# Patient Record
Sex: Female | Born: 1965
Health system: Southern US, Community
[De-identification: ages and names within clinical notes are randomized; demographics above are authoritative.]

## PROBLEM LIST (undated history)

## (undated) DIAGNOSIS — M1990 Unspecified osteoarthritis, unspecified site: Secondary | ICD-10-CM

## (undated) DIAGNOSIS — I739 Peripheral vascular disease, unspecified: Secondary | ICD-10-CM

## (undated) DIAGNOSIS — R7303 Prediabetes: Secondary | ICD-10-CM

## (undated) DIAGNOSIS — R0600 Dyspnea, unspecified: Secondary | ICD-10-CM

## (undated) DIAGNOSIS — E785 Hyperlipidemia, unspecified: Secondary | ICD-10-CM

## (undated) DIAGNOSIS — E669 Obesity, unspecified: Secondary | ICD-10-CM

## (undated) DIAGNOSIS — F419 Anxiety disorder, unspecified: Secondary | ICD-10-CM

## (undated) DIAGNOSIS — I10 Essential (primary) hypertension: Secondary | ICD-10-CM

## (undated) HISTORY — PX: ECTOPIC PREGNANCY SURGERY: SHX613

---

## 1998-03-12 ENCOUNTER — Emergency Department (HOSPITAL_COMMUNITY): Admission: EM | Admit: 1998-03-12 | Discharge: 1998-03-12 | Payer: Self-pay | Admitting: Emergency Medicine

## 1998-03-21 ENCOUNTER — Emergency Department (HOSPITAL_COMMUNITY): Admission: EM | Admit: 1998-03-21 | Discharge: 1998-03-21 | Payer: Self-pay | Admitting: Emergency Medicine

## 1998-04-04 ENCOUNTER — Emergency Department (HOSPITAL_COMMUNITY): Admission: EM | Admit: 1998-04-04 | Discharge: 1998-04-04 | Payer: Self-pay | Admitting: Emergency Medicine

## 1998-05-18 ENCOUNTER — Inpatient Hospital Stay (HOSPITAL_COMMUNITY): Admission: RE | Admit: 1998-05-18 | Discharge: 1998-05-21 | Payer: Self-pay | Admitting: *Deleted

## 1999-09-08 ENCOUNTER — Encounter: Payer: Self-pay | Admitting: *Deleted

## 1999-09-08 ENCOUNTER — Emergency Department (HOSPITAL_COMMUNITY): Admission: EM | Admit: 1999-09-08 | Discharge: 1999-09-08 | Payer: Self-pay | Admitting: Emergency Medicine

## 1999-09-09 ENCOUNTER — Encounter: Payer: Self-pay | Admitting: Emergency Medicine

## 2000-01-10 ENCOUNTER — Emergency Department (HOSPITAL_COMMUNITY): Admission: EM | Admit: 2000-01-10 | Discharge: 2000-01-10 | Payer: Self-pay | Admitting: Emergency Medicine

## 2004-03-21 ENCOUNTER — Emergency Department (HOSPITAL_COMMUNITY): Admission: EM | Admit: 2004-03-21 | Discharge: 2004-03-21 | Payer: Self-pay | Admitting: Emergency Medicine

## 2005-01-22 ENCOUNTER — Emergency Department (HOSPITAL_COMMUNITY): Admission: EM | Admit: 2005-01-22 | Discharge: 2005-01-22 | Payer: Self-pay | Admitting: Emergency Medicine

## 2005-02-04 ENCOUNTER — Emergency Department (HOSPITAL_COMMUNITY): Admission: EM | Admit: 2005-02-04 | Discharge: 2005-02-04 | Payer: Self-pay | Admitting: Emergency Medicine

## 2005-02-06 ENCOUNTER — Emergency Department (HOSPITAL_COMMUNITY): Admission: EM | Admit: 2005-02-06 | Discharge: 2005-02-06 | Payer: Self-pay | Admitting: Emergency Medicine

## 2006-06-11 ENCOUNTER — Emergency Department (HOSPITAL_COMMUNITY): Admission: EM | Admit: 2006-06-11 | Discharge: 2006-06-11 | Payer: Self-pay | Admitting: Emergency Medicine

## 2007-01-05 ENCOUNTER — Emergency Department (HOSPITAL_COMMUNITY): Admission: EM | Admit: 2007-01-05 | Discharge: 2007-01-05 | Payer: Self-pay | Admitting: Emergency Medicine

## 2007-06-07 ENCOUNTER — Emergency Department (HOSPITAL_COMMUNITY): Admission: EM | Admit: 2007-06-07 | Discharge: 2007-06-07 | Payer: Self-pay | Admitting: Emergency Medicine

## 2007-12-04 ENCOUNTER — Emergency Department (HOSPITAL_COMMUNITY): Admission: EM | Admit: 2007-12-04 | Discharge: 2007-12-04 | Payer: Self-pay | Admitting: Emergency Medicine

## 2008-03-14 ENCOUNTER — Emergency Department (HOSPITAL_COMMUNITY): Admission: EM | Admit: 2008-03-14 | Discharge: 2008-03-14 | Payer: Self-pay | Admitting: Emergency Medicine

## 2012-11-01 ENCOUNTER — Encounter (HOSPITAL_COMMUNITY): Payer: Self-pay | Admitting: *Deleted

## 2012-11-01 ENCOUNTER — Emergency Department (HOSPITAL_COMMUNITY)
Admission: EM | Admit: 2012-11-01 | Discharge: 2012-11-01 | Disposition: A | Discharge status: Home / Self Care | Payer: Self-pay | Attending: Emergency Medicine | Admitting: Emergency Medicine

## 2012-11-01 DIAGNOSIS — M542 Cervicalgia: Secondary | ICD-10-CM | POA: Insufficient documentation

## 2012-11-01 DIAGNOSIS — E669 Obesity, unspecified: Secondary | ICD-10-CM | POA: Insufficient documentation

## 2012-11-01 DIAGNOSIS — I10 Essential (primary) hypertension: Secondary | ICD-10-CM | POA: Insufficient documentation

## 2012-11-01 DIAGNOSIS — R51 Headache: Secondary | ICD-10-CM | POA: Insufficient documentation

## 2012-11-01 DIAGNOSIS — F17200 Nicotine dependence, unspecified, uncomplicated: Secondary | ICD-10-CM | POA: Insufficient documentation

## 2012-11-01 DIAGNOSIS — K029 Dental caries, unspecified: Secondary | ICD-10-CM | POA: Insufficient documentation

## 2012-11-01 HISTORY — DX: Essential (primary) hypertension: I10

## 2012-11-01 HISTORY — DX: Obesity, unspecified: E66.9

## 2012-11-01 MED ORDER — AMOXICILLIN 500 MG PO CAPS: 500.0000 mg | ORAL_CAPSULE | Freq: Three times a day (TID) | ORAL | Status: DC

## 2012-11-01 MED ORDER — OXYCODONE-ACETAMINOPHEN 5-325 MG PO TABS: ORAL_TABLET | ORAL | Status: DC

## 2012-11-01 MED ORDER — OXYCODONE-ACETAMINOPHEN 5-325 MG PO TABS
1.0000 | ORAL_TABLET | Freq: Once | ORAL | Status: AC
  Administered 2012-11-01: 1 via ORAL
  Filled 2012-11-01: qty 1

## 2012-11-01 NOTE — ED Provider Notes (Signed)
History  This chart was scribed for non-physician practitioner Wynetta Emery, PA-C working with Toy Baker, MD, by Candelaria Stagers, ED Scribe. This patient was seen in room WTR9/WTR9 and the patient's care was started at 4:14 PM   CSN: [id number]  Arrival date & time 11/01/12  1502   First MD Initiated Contact with Patient 11/01/12 1548      Chief Complaint  Patient presents with  . Dental Pain  . Headache  . Neck Pain     The history is provided by the patient. No language interpreter was used.   HPI Comments: Sharon Dyer is a 47 y.o. female who presents to the Emergency Department complaining of constant left upper dental pain that started several weeks ago and became worse over the last three days.  Pt reports the pain is 7/10 now with intermittent sharp pains.  Pt denies fever, chills, nausea, or vomiting, trismus, difficulty swallowing her secretions.  Pt has taken ibuprofen with temporary relief, reporting taking 5 ibuprofen 3-4 times a day.  She is also experiencing headache and neck pain.    Past Medical History  Diagnosis Date  . Hypertension   . Obesity     Past Surgical History  Procedure Laterality Date  . Ectopic pregnancy surgery      2    History reviewed. No pertinent family history.  History  Substance Use Topics  . Smoking status: Current Every Day Smoker -- 1.00 packs/day    Types: Cigarettes  . Smokeless tobacco: Never Used  . Alcohol Use: Yes     Comment: twice a year    OB History   Grav Para Term Preterm Abortions TAB SAB Ect Mult Living                  Review of Systems  Constitutional: Negative for fever and chills.  HENT: Positive for dental problem (left upper dental pain).   Respiratory: Negative for shortness of breath.   Cardiovascular: Negative for chest pain.  Gastrointestinal: Negative for nausea, vomiting, abdominal pain and diarrhea.  All other systems reviewed and are negative.    Allergies  Review of  patient's allergies indicates no known allergies.  Home Medications   Current Outpatient Rx  Name  Route  Sig  Dispense  Refill  . ibuprofen (ADVIL,MOTRIN) 200 MG tablet   Oral   Take 1,000 mg by mouth every 6 (six) hours as needed for pain.         Marland Kitchen lisinopril (PRINIVIL,ZESTRIL) 10 MG tablet   Oral   Take 10 mg by mouth every morning.           BP 147/70  Pulse 60  Temp(Src) 98.5 F (36.9 C) (Oral)  Resp 18  Ht 5\' 8"  (1.727 m)  Wt 300 lb (136.079 kg)  BMI 45.63 kg/m2  SpO2 100%  LMP 10/18/2012  Physical Exam  Nursing note and vitals reviewed. Constitutional: She is oriented to person, place, and time. She appears well-developed and well-nourished. No distress.  HENT:  Head: Normocephalic. No trismus in the jaw.  Mouth/Throat: Uvula is midline and oropharynx is clear and moist. No edematous. No oropharyngeal exudate, posterior oropharyngeal edema, posterior oropharyngeal erythema or tonsillar abscesses.    Generally poor dentition, no gingival swelling, erythema or tenderness to palpation. Patient is handling their secretions. There is no tenderness to palpation or firmness underneath tongue bilaterally. No trismus.    Eyes: Conjunctivae and EOM are normal.  Cardiovascular: Normal rate.   Pulmonary/Chest:  Effort normal. No stridor.  Musculoskeletal: Normal range of motion.  Lymphadenopathy:    She has no cervical adenopathy.  Neurological: She is alert and oriented to person, place, and time.  Psychiatric: She has a normal mood and affect.    ED Course  Procedures   DIAGNOSTIC STUDIES: Oxygen Saturation is 100% on room air, normal by my interpretation.    COORDINATION OF CARE:  4:21 PM Discussed course of care with pt which includes pain medication.  Discussed safe instructions on ibuprofen use.  Advised follow up with dentist, will provide referral.  Pt understands and agrees.    Labs Reviewed - No data to display No results found.   1. Dental  caries       MDM   Filed Vitals:   11/01/12 1555  BP: 147/70  Pulse: 60  Temp: 98.5 F (36.9 C)  TempSrc: Oral  Resp: 18  Height: 5\' 8"  (1.727 m)  Weight: 300 lb (136.079 kg)  SpO2: 100%     Sharon Dyer is a 47 y.o. female Patient with toothache.  No gross abscess.  Exam unconcerning for Ludwig's angina or spread of infection.  Will treat with penicillin and pain medicine.  Urged patient to follow-up with dentist.     Medications  oxyCODONE-acetaminophen (PERCOCET/ROXICET) 5-325 MG per tablet 1 tablet (1 tablet Oral Given 11/01/12 1642)    The patient is hemodynamically stable, appropriate for, and amenable to, discharge at this time. Pt verbalized understanding and agrees with care plan. Outpatient follow-up and return precautions given.    Discharge Medication List as of 11/01/2012  4:28 PM    START taking these medications   Details  amoxicillin (AMOXIL) 500 MG capsule Take 1 capsule (500 mg total) by mouth 3 (three) times daily., Starting 11/01/2012, Until Discontinued, Print    oxyCODONE-acetaminophen (PERCOCET/ROXICET) 5-325 MG per tablet 1 to 2 tabs PO q6hrs  PRN for pain, Print        I personally performed the services described in this documentation, which was scribed in my presence. The recorded information has been reviewed and is accurate.        Wynetta Emery, PA-C 11/03/12 1047

## 2012-11-01 NOTE — ED Notes (Signed)
Pt from home with reports of dental pain due to "2 bad teeth" on left upper area as well as a headache and neck pain that has worsened over the last 3 days. Pt reports pain was only at night but is now constant. Minimal swelling noted to left side of face.

## 2012-11-04 NOTE — ED Provider Notes (Signed)
Medical screening examination/treatment/procedure(s) were performed by non-physician practitioner and as supervising physician I was immediately available for consultation/collaboration.  Mckayla Mulcahey T Mychele Seyller, MD 11/04/12 2325 

## 2013-05-18 ENCOUNTER — Emergency Department (HOSPITAL_COMMUNITY): Payer: Self-pay

## 2013-05-18 ENCOUNTER — Encounter (HOSPITAL_COMMUNITY): Payer: Self-pay | Admitting: Emergency Medicine

## 2013-05-18 ENCOUNTER — Emergency Department (HOSPITAL_COMMUNITY)
Admission: EM | Admit: 2013-05-18 | Discharge: 2013-05-18 | Disposition: A | Discharge status: Home / Self Care | Payer: Self-pay | Attending: Emergency Medicine | Admitting: Emergency Medicine

## 2013-05-18 DIAGNOSIS — M109 Gout, unspecified: Secondary | ICD-10-CM | POA: Insufficient documentation

## 2013-05-18 DIAGNOSIS — E669 Obesity, unspecified: Secondary | ICD-10-CM | POA: Insufficient documentation

## 2013-05-18 DIAGNOSIS — Z79899 Other long term (current) drug therapy: Secondary | ICD-10-CM | POA: Insufficient documentation

## 2013-05-18 DIAGNOSIS — I10 Essential (primary) hypertension: Secondary | ICD-10-CM | POA: Insufficient documentation

## 2013-05-18 DIAGNOSIS — F17200 Nicotine dependence, unspecified, uncomplicated: Secondary | ICD-10-CM | POA: Insufficient documentation

## 2013-05-18 MED ORDER — OXYCODONE-ACETAMINOPHEN 5-325 MG PO TABS
1.0000 | ORAL_TABLET | Freq: Once | ORAL | Status: AC
  Administered 2013-05-18: 1 via ORAL
  Filled 2013-05-18: qty 1

## 2013-05-18 MED ORDER — IBUPROFEN 600 MG PO TABS: 600.0000 mg | ORAL_TABLET | Freq: Three times a day (TID) | ORAL | Status: DC | PRN

## 2013-05-18 MED ORDER — IBUPROFEN 800 MG PO TABS
800.0000 mg | ORAL_TABLET | Freq: Once | ORAL | Status: AC
  Administered 2013-05-18: 800 mg via ORAL
  Filled 2013-05-18: qty 1

## 2013-05-18 MED ORDER — HYDROCODONE-ACETAMINOPHEN 5-325 MG PO TABS: 1.0000 | ORAL_TABLET | ORAL | Status: DC | PRN

## 2013-05-18 NOTE — ED Provider Notes (Signed)
CSN: [id number]     Arrival date & time 05/18/13  0804 History   First MD Initiated Contact with Patient 05/18/13 [phone]     Chief Complaint  Patient presents with  . Hand Pain   HPI Patient reports developing pain in her right ring finger over the past 12-24 hours.  She has a history of gout.  No fevers or chills.  No trauma or injury to the right ring finger.  No recent pain.  No spreading erythema.  She states her pain is moderate in severity with light touch and palpation.   Past Medical History  Diagnosis Date  . Hypertension   . Obesity    Past Surgical History  Procedure Laterality Date  . Ectopic pregnancy surgery      2   No family history on file. History  Substance Use Topics  . Smoking status: Current Every Day Smoker -- 1.00 packs/day    Types: Cigarettes  . Smokeless tobacco: Never Used  . Alcohol Use: Yes     Comment: twice a year   OB History   Grav Para Term Preterm Abortions TAB SAB Ect Mult Living                 Review of Systems  All other systems reviewed and are negative.    Allergies  Review of patient's allergies indicates no known allergies.  Home Medications   Current Outpatient Rx  Name  Route  Sig  Dispense  Refill  . ibuprofen (ADVIL,MOTRIN) 200 MG tablet   Oral   Take 1,000 mg by mouth every 6 (six) hours as needed for pain.         Marland Kitchen lisinopril (PRINIVIL,ZESTRIL) 20 MG tablet   Oral   Take 20 mg by mouth daily.         Marland Kitchen HYDROcodone-acetaminophen (NORCO/VICODIN) 5-325 MG per tablet   Oral   Take 1 tablet by mouth every 4 (four) hours as needed for moderate pain.   15 tablet   0   . ibuprofen (ADVIL,MOTRIN) 600 MG tablet   Oral   Take 1 tablet (600 mg total) by mouth every 8 (eight) hours as needed.   15 tablet   0    BP 213/99  Pulse 89  Temp(Src) 98.1 F (36.7 C) (Oral)  Resp 16  SpO2 99%  LMP 04/24/2013 Physical Exam  Nursing note and vitals reviewed. Constitutional: She is oriented to person, place, and  time. She appears well-developed and well-nourished.  HENT:  Head: Normocephalic.  Eyes: EOM are normal.  Neck: Normal range of motion.  Pulmonary/Chest: Effort normal.  Abdominal: She exhibits no distension.  Musculoskeletal: Normal range of motion.  Patient with swelling of the PIP joint of the right ring finger.  Mild erythema and swelling over the dorsum of the proximal phalanx of the same finger.  Tenderness on the volar side or swelling on the volar side.  Neurological: She is alert and oriented to person, place, and time.  Psychiatric: She has a normal mood and affect.    ED Course  Procedures (including critical care time) Labs Review Labs Reviewed - No data to display Imaging Review No results found.  EKG Interpretation   None       MDM   1. Gout flare    Patient has a history of gout and this likely is a flare of gout in her right ring finger PIP joint.  Anti-inflammatories.  Doubt septic arthritis.  No trauma or injury.  Lyanne Co, MD 05/18/13 [phone]

## 2013-05-18 NOTE — ED Notes (Signed)
Pt has a erythema to rt ring finger to rt hand. Denies any injury states that it started yesterday. Swelling and pain to knuckle area.

## 2013-05-18 NOTE — Progress Notes (Signed)
[facility] provided pt with a list of primary care resources and information on [facility].  °

## 2013-10-08 ENCOUNTER — Encounter (HOSPITAL_COMMUNITY): Payer: Self-pay | Admitting: Emergency Medicine

## 2013-10-08 ENCOUNTER — Emergency Department (HOSPITAL_COMMUNITY)
Admission: EM | Admit: 2013-10-08 | Discharge: 2013-10-08 | Disposition: A | Discharge status: Home / Self Care | Payer: Self-pay | Attending: Emergency Medicine | Admitting: Emergency Medicine

## 2013-10-08 DIAGNOSIS — Z79899 Other long term (current) drug therapy: Secondary | ICD-10-CM | POA: Insufficient documentation

## 2013-10-08 DIAGNOSIS — F17200 Nicotine dependence, unspecified, uncomplicated: Secondary | ICD-10-CM | POA: Insufficient documentation

## 2013-10-08 DIAGNOSIS — H209 Unspecified iridocyclitis: Secondary | ICD-10-CM | POA: Insufficient documentation

## 2013-10-08 DIAGNOSIS — E669 Obesity, unspecified: Secondary | ICD-10-CM | POA: Insufficient documentation

## 2013-10-08 DIAGNOSIS — I10 Essential (primary) hypertension: Secondary | ICD-10-CM | POA: Insufficient documentation

## 2013-10-08 MED ORDER — PROPARACAINE HCL 0.5 % OP SOLN
1.0000 [drp] | Freq: Once | OPHTHALMIC | Status: AC
Start: 2013-10-08 — End: 2013-10-08
  Administered 2013-10-08: 1 [drp] via OPHTHALMIC
  Filled 2013-10-08: qty 15

## 2013-10-08 MED ORDER — FLUORESCEIN SODIUM 1 MG OP STRP
1.0000 | ORAL_STRIP | Freq: Once | OPHTHALMIC | Status: AC
  Administered 2013-10-08: 1 via OPHTHALMIC
  Filled 2013-10-08: qty 1

## 2013-10-08 MED ORDER — PREDNISOLONE ACETATE 1 % OP SUSP: 1.0000 [drp] | Freq: Four times a day (QID) | OPHTHALMIC | Status: DC

## 2013-10-08 MED ORDER — PREDNISOLONE ACETATE 1 % OP SUSP
1.0000 [drp] | Freq: Once | OPHTHALMIC | Status: AC
  Administered 2013-10-08: 1 [drp] via OPHTHALMIC
  Filled 2013-10-08: qty 1

## 2013-10-08 NOTE — ED Notes (Addendum)
shes had pain and redness to bilateral eyes "for a while." she ws seen by a doctor and treated with prednisone eye drops but symptoms persist. Shes on bp medication, states shes taking as directed but her bp is elevated today

## 2013-10-08 NOTE — ED Provider Notes (Signed)
CSN: [id number]     Arrival date & time 10/08/13  1854 History  This chart was scribed for non-physician practitioner, Wynetta EmeryNicole Eulice Rutledge, PA-C working with Geoffery Lyonsouglas Delo, MD by Greggory StallionKayla Andersen, ED scribe. This patient was seen in room TR04C/TR04C and the patient's care was started at 7:53 PM.    Chief Complaint  Patient presents with  . Eye Pain   The history is provided by the patient. No language interpreter was used.   HPI Comments: Sharon Dyer is a 48 y.o. female who presents to the Emergency Department complaining of gradual onset left eye pain and redness that started 2 days ago. She also has associated photophobia and headache. States it worsened yesterday and spread to her right eye. Eye movement worsens the pain. She states this has happened two other times in the last year but this is worse than the other times. Pt has been evaluated for the same in the past, diagnosed with iritis and treated with prednisone eye drops with no relief. She states she has not seen an ophthalmologist because she does not have insurance. Pt has done warm compresses with total relief of pain and taken ibuprofen with no relief. Denies fever, eye discharge, trouble speaking, difficulty ambulating, blurry vision, double vision. Family states her eyes do not look swollen. Denies history of headaches.   Past Medical History  Diagnosis Date  . Hypertension   . Obesity    Past Surgical History  Procedure Laterality Date  . Ectopic pregnancy surgery      2   History reviewed. No pertinent family history. History  Substance Use Topics  . Smoking status: Current Every Day Smoker -- 1.00 packs/day    Types: Cigarettes  . Smokeless tobacco: Never Used  . Alcohol Use: Yes     Comment: twice a year   OB History   Grav Para Term Preterm Abortions TAB SAB Ect Mult Living                 Review of Systems  HENT: Negative for facial swelling.   Eyes: Positive for photophobia, pain and redness. Negative for  discharge and visual disturbance.  Neurological: Positive for headaches.  All other systems reviewed and are negative.  Allergies  Review of patient's allergies indicates no known allergies.  Home Medications   Prior to Admission medications   Medication Sig Start Date End Date Taking? Authorizing Provider  HYDROcodone-acetaminophen (NORCO/VICODIN) 5-325 MG per tablet Take 1 tablet by mouth every 4 (four) hours as needed for moderate pain. 05/18/13   Lyanne CoKevin M Campos, MD  ibuprofen (ADVIL,MOTRIN) 200 MG tablet Take 1,000 mg by mouth every 6 (six) hours as needed for pain.    Historical Provider, MD  ibuprofen (ADVIL,MOTRIN) 600 MG tablet Take 1 tablet (600 mg total) by mouth every 8 (eight) hours as needed. 05/18/13   Lyanne CoKevin M Campos, MD  lisinopril (PRINIVIL,ZESTRIL) 20 MG tablet Take 20 mg by mouth daily.    Historical Provider, MD   Pulse 87  Temp(Src) 98.2 F (36.8 C) (Oral)  Resp 16  Ht 5\' 8"  (1.727 m)  Wt 300 lb (136.079 kg)  BMI 45.63 kg/m2  SpO2 98%  Physical Exam  Nursing note and vitals reviewed. Constitutional: She is oriented to person, place, and time. She appears well-developed and well-nourished. No distress.  HENT:  Head: Normocephalic.  Eyes: EOM are normal. Pupils are equal, round, and reactive to light. Right eye exhibits no discharge. Left eye exhibits no discharge. Right conjunctiva is  injected. Left conjunctiva is injected.  Slit lamp exam:      The right eye shows no corneal abrasion and no corneal ulcer.       The left eye shows no corneal abrasion and no corneal ulcer.  Left eye pressure: 18 Right eye pressure: 17 Consensual photophobia.  Visual Acuity - Bilateral Near: 20/20 ; R Near: 20/20 ; L Near: 20/30   Pain resolved with proparacaine.   Cardiovascular: Normal rate.   Pulmonary/Chest: Effort normal. No stridor.  Musculoskeletal: Normal range of motion.  Neurological: She is alert and oriented to person, place, and time.  Psychiatric: She has a  normal mood and affect.    ED Course  Procedures (including critical care time)  DIAGNOSTIC STUDIES: Oxygen Saturation is 98% on RA, normal by my interpretation.    COORDINATION OF CARE: 7:56 PM-Discussed treatment plan which includes numbing drops and speaking with Dr. Judd Lienelo with pt at bedside and pt agreed to plan.   8:28 PM-Pt states pain improved with proparacaine.   9:14 PM-Consulted with Dr. Charlotte SanesMcCuen from ophthalmology. She advised that pt be started on predforte QID and follow up in office.   Labs Review Labs Reviewed - No data to display  Imaging Review No results found.   EKG Interpretation None      MDM   Final diagnoses:  None   Filed Vitals:   10/08/13 1859 10/08/13 1916 10/08/13 2140  BP:  218/100 190/110  Pulse: 87  76  Temp: 98.2 F (36.8 C)  98.4 F (36.9 C)  TempSrc: Oral  Oral  Resp: 16  18  Height: 5\' 8"  (1.727 m)    Weight: 300 lb (136.079 kg)    SpO2: 98%  98%    Medications  proparacaine (ALCAINE) 0.5 % ophthalmic solution 1 drop (1 drop Both Eyes Given 10/08/13 2039)  fluorescein ophthalmic strip 1 strip (1 strip Both Eyes Given 10/08/13 2040)  prednisoLONE acetate (PRED FORTE) 1 % ophthalmic suspension 1 drop (1 drop Both Eyes Given 10/08/13 2142)    Sharon Dyer is a 48 y.o. female presenting with bilateral eye redness and pain. This is a recurrent event for her. Slip lamp exam could not be performed as slit lamp is nonfunctional. Patient has normal pressures in the eye. Ophthalmology consult from Dr. Charlotte SanesMcCuen appreciated: States patient does not require emergent specialist evaluation at this time but requests prescription for Pred Forte and she will see the patient in the office in the morning at 8:30 AM on Monday. I discussed return precautions with the patient who has verbalized her understanding.  Evaluation does not show pathology that would require ongoing emergent intervention or inpatient treatment. Pt is hemodynamically stable and  mentating appropriately. Discussed findings and plan with patient/guardian, who agrees with care plan. All questions answered. Return precautions discussed and outpatient follow up given.   Discharge Medication List as of 10/08/2013  9:25 PM    START taking these medications   Details  prednisoLONE acetate (PRED FORTE) 1 % ophthalmic suspension Place 1 drop into both eyes 4 (four) times daily., Starting 10/08/2013, Until Discontinued, Print        Note: Portions of this report may have been transcribed using voice recognition software. Every effort was made to ensure accuracy; however, inadvertent computerized transcription errors may be present   I personally performed the services described in this documentation, which was scribed in my presence. The recorded information has been reviewed and is accurate.  Joni Reiningicole Karell Tukes, PA-C 10/10/13 0120

## 2013-10-08 NOTE — ED Notes (Signed)
PA at bedside.

## 2013-10-08 NOTE — Discharge Instructions (Signed)
You have an appointment scheduled with the ophthalmologist, Dr. Charlotte SanesMcCuen at 8:30 AM Monday. Do not hesitate to return to the emergency room cranial, worsening or concerning symptoms.  Put 1 drop in each eye 4 times a day. Iritis Iritis is when the colored part of the eye (iris) is red and painful (inflamed). Light might seem to hurt your eyes (light sensitivity). You may have blurred vision or start to tear up. It is important to treat this problem early.  HOME CARE  Use eyedrops or pills (corticosteroid medicine) as told by your doctor.  Only take medicine as told by your doctor. GET HELP RIGHT AWAY IF:   You have redness in one or both eyes.  Light seems to hurt your eyes.  You have pain in one or both eyes. MAKE SURE YOU:   Understand these instructions.  Will watch your condition.  Will get help right away if you are not doing well or get worse. Document Released: 08/21/2009 Document Revised: 08/19/2011 Document Reviewed: 08/21/2009 [facility] Patient Information 2014 [place], [place].

## 2013-10-11 NOTE — ED Provider Notes (Signed)
Medical screening examination/treatment/procedure(s) were performed by non-physician practitioner and as supervising physician I was immediately available for consultation/collaboration.     Savion Washam, MD 10/11/13 0921 

## 2014-06-05 ENCOUNTER — Encounter (HOSPITAL_COMMUNITY): Payer: Self-pay | Admitting: Emergency Medicine

## 2014-06-05 ENCOUNTER — Emergency Department (HOSPITAL_COMMUNITY)
Admission: EM | Admit: 2014-06-05 | Discharge: 2014-06-05 | Disposition: A | Discharge status: Home / Self Care | Payer: Self-pay | Attending: Emergency Medicine | Admitting: Emergency Medicine

## 2014-06-05 ENCOUNTER — Emergency Department (HOSPITAL_COMMUNITY): Payer: Self-pay

## 2014-06-05 DIAGNOSIS — Z79899 Other long term (current) drug therapy: Secondary | ICD-10-CM | POA: Insufficient documentation

## 2014-06-05 DIAGNOSIS — I10 Essential (primary) hypertension: Secondary | ICD-10-CM | POA: Insufficient documentation

## 2014-06-05 DIAGNOSIS — Z7952 Long term (current) use of systemic steroids: Secondary | ICD-10-CM | POA: Insufficient documentation

## 2014-06-05 DIAGNOSIS — M1990 Unspecified osteoarthritis, unspecified site: Secondary | ICD-10-CM | POA: Insufficient documentation

## 2014-06-05 DIAGNOSIS — Z720 Tobacco use: Secondary | ICD-10-CM | POA: Insufficient documentation

## 2014-06-05 DIAGNOSIS — E669 Obesity, unspecified: Secondary | ICD-10-CM | POA: Insufficient documentation

## 2014-06-05 DIAGNOSIS — M25562 Pain in left knee: Secondary | ICD-10-CM | POA: Insufficient documentation

## 2014-06-05 MED ORDER — NAPROXEN 500 MG PO TABS: 500.0000 mg | ORAL_TABLET | Freq: Two times a day (BID) | ORAL | Status: DC

## 2014-06-05 MED ORDER — NAPROXEN 500 MG PO TABS
500.0000 mg | ORAL_TABLET | Freq: Once | ORAL | Status: AC
  Administered 2014-06-05: 500 mg via ORAL
  Filled 2014-06-05: qty 1

## 2014-06-05 NOTE — ED Provider Notes (Signed)
CSN: [id number]     Arrival date & time 06/05/14  [id number] History   First MD Initiated Contact with Patient 06/05/14 [phone]     Chief Complaint  Patient presents with  . Knee Pain    Sharon Dyer is a 48 y.o. female with a history of arthritis who presents emergency department complaining of atraumatic left knee pain for the past week. The patient reports he does lots of walking at work standing and getting progressively worse over the past week. Patient denies trauma to her knee. Patient reports it's the medial aspect of her knee that hurts the worse. Patient rates her pain at 6 out of 10 and has no pain with bowel movement. Patient hasn't attempted no treatment today. Patient denies previous injury to her left knee. Patient has previously surgeries. The patient denies fevers, chills, numbness, tingling, swelling, difficulty walking, or weakness. Patient denies personal or family history of DVTs or PEs. Patient denies personal or family history of blood clotting disorders such as factor V leiden, protein C or S deficiency.   (Consider location/radiation/quality/duration/timing/severity/associated sxs/prior Treatment) HPI  Past Medical History  Diagnosis Date  . Hypertension   . Obesity    Past Surgical History  Procedure Laterality Date  . Ectopic pregnancy surgery      2   History reviewed. No pertinent family history. History  Substance Use Topics  . Smoking status: Current Every Day Smoker -- 1.00 packs/day    Types: Cigarettes  . Smokeless tobacco: Never Used  . Alcohol Use: Yes     Comment: twice a year   OB History    No data available     Review of Systems  Constitutional: Negative for fever and chills.  HENT: Negative for congestion and sore throat.   Eyes: Negative for visual disturbance.  Respiratory: Negative for cough, shortness of breath and wheezing.   Cardiovascular: Negative for chest pain, palpitations and leg swelling.  Gastrointestinal: Negative for nausea,  vomiting, abdominal pain and diarrhea.  Genitourinary: Negative for dysuria, hematuria and difficulty urinating.  Musculoskeletal: Negative for back pain, joint swelling and neck pain.       Left knee pain  Skin: Negative for rash and wound.  Neurological: Negative for dizziness, weakness, numbness and headaches.      Allergies  Review of patient's allergies indicates no known allergies.  Home Medications   Prior to Admission medications   Medication Sig Start Date End Date Taking? Authorizing Provider  lisinopril-hydrochlorothiazide (PRINZIDE,ZESTORETIC) 20-12.5 MG per tablet Take 1 tablet by mouth daily.   Yes Historical Provider, MD  naproxen (NAPROSYN) 500 MG tablet Take 1 tablet (500 mg total) by mouth 2 (two) times daily with a meal. 06/05/14   Einar GipWilliam Duncan Yaroslav Gombos, PA-C  prednisoLONE acetate (PRED FORTE) 1 % ophthalmic suspension Place 1 drop into both eyes 4 (four) times daily. Patient not taking: Reported on 06/05/2014 10/08/13   Joni ReiningNicole Pisciotta, PA-C   BP 192/90 mmHg  Pulse 69  Temp(Src) 97.8 F (36.6 C) (Oral)  Resp 19  Ht 5\' 8"  (1.727 m)  Wt 282 lb 9.6 oz (128.187 kg)  BMI 42.98 kg/m2  SpO2 96%  LMP 06/05/2014 (Exact Date) Physical Exam  Constitutional: She appears well-developed and well-nourished. No distress.  HENT:  Head: Normocephalic and atraumatic.  Mouth/Throat: Oropharynx is clear and moist.  Eyes: Conjunctivae are normal. Pupils are equal, round, and reactive to light. Right eye exhibits no discharge. Left eye exhibits no discharge.  Neck: Neck supple.  Cardiovascular: Normal  rate, regular rhythm, normal heart sounds and intact distal pulses.  Exam reveals no gallop and no friction rub.   No murmur heard. Patient's bilateral posterior tibialis pulses are intact.  Pulmonary/Chest: Effort normal and breath sounds normal. No respiratory distress. She has no wheezes. She has no rales.  Abdominal: Soft. She exhibits no distension. There is no tenderness.   Musculoskeletal: Normal range of motion. She exhibits no edema or tenderness.  Patient has 5 out of 5 strength in her bilateral upper and lower extremities. Patient's bilateral patellar DTRs intact. Patient is able to ambulate in the room without difficulty or assistance. Patient has no tenderness to her left knee. There is no knee instability. There is no edema noted to her bilateral knees or calves. There is no knee bony point tenderness.  Lymphadenopathy:    She has no cervical adenopathy.  Neurological: She is alert. She has normal reflexes. She displays normal reflexes. Coordination normal.  Skin: Skin is warm and dry. No rash noted. She is not diaphoretic. No erythema. No pallor.  Psychiatric: She has a normal mood and affect. Her behavior is normal.  Nursing note and vitals reviewed.   ED Course  Procedures (including critical care time) Labs Review Labs Reviewed - No data to display  Imaging Review Dg Knee Complete 4 Views Left  06/05/2014   CLINICAL DATA:  Left anterior medial knee pain 4-5 days.  No injury.  EXAM: LEFT KNEE - COMPLETE 4+ VIEW  COMPARISON:  None.  FINDINGS: There are moderate tricompartmental osteoarthritic changes. There is no fracture or dislocation. There is no significant joint effusion.  IMPRESSION: No acute findings.  Moderate osteoarthritic change.   Electronically Signed   By: Elberta Fortisaniel  Boyle M.D.   On: 06/05/2014 08:05     EKG Interpretation None      Filed Vitals:   06/05/14 0634  BP: 192/90  Pulse: 69  Temp: 97.8 F (36.6 C)  TempSrc: Oral  Resp: 19  Height: 5\' 8"  (1.727 m)  Weight: 282 lb 9.6 oz (128.187 kg)  SpO2: 96%     MDM   Meds given in ED:  Medications  naproxen (NAPROSYN) tablet 500 mg (500 mg Oral Given 06/05/14 0911)    Discharge Medication List as of 06/05/2014  8:54 AM    START taking these medications   Details  naproxen (NAPROSYN) 500 MG tablet Take 1 tablet (500 mg total) by mouth 2 (two) times daily with a meal.,  Starting 06/05/2014, Until Discontinued, Print        Final diagnoses:  Left knee pain   Patient presented with 1 week of worsening left knee pain. This patient works for. The patient's exam is unremarkable. The patient's left knee x-ray showed no acute findings. It did show moderate osteoarthritic changes. Patient is provided naproxen in the ED prior to discharge. The patient was provided a knee sleeve. Patient prescribed naproxen for pain control. Advised the patient [facility] for [facility]. Advised patient return to emergency department with new or worsening symptoms or new concerns. Patient verbalized understanding and agreement with plan.   This patient was discussed with Dr. Patria Maneampos who agrees assessment and plan.    Lawana ChambersWilliam Duncan Oliviah Agostini, PA-C 06/05/14 1642  Lyanne CoKevin M Campos, MD 06/06/14 [phone]

## 2014-06-05 NOTE — Discharge Instructions (Signed)
Knee Pain °The knee is the complex joint between your thigh and your lower leg. It is made up of bones, tendons, ligaments, and cartilage. The bones that make up the knee are: °· The femur in the thigh. °· The tibia and fibula in the lower leg. °· The patella or kneecap riding in the groove on the lower femur. °CAUSES  °Knee pain is a common complaint with many causes. A few of these causes are: °· Injury, such as: °¨ A ruptured ligament or tendon injury. °¨ Torn cartilage. °· Medical conditions, such as: °¨ Gout °¨ Arthritis °¨ Infections °· Overuse, over training, or overdoing a physical activity. °Knee pain can be minor or severe. Knee pain can accompany debilitating injury. Minor knee problems often respond well to self-care measures or get well on their own. More serious injuries may need medical intervention or even surgery. °SYMPTOMS °The knee is complex. Symptoms of knee problems can vary widely. Some of the problems are: °· Pain with movement and weight bearing. °· Swelling and tenderness. °· Buckling of the knee. °· Inability to straighten or extend your knee. °· Your knee locks and you cannot straighten it. °· Warmth and redness with pain and fever. °· Deformity or dislocation of the kneecap. °DIAGNOSIS  °Determining what is wrong may be very straight forward such as when there is an injury. It can also be challenging because of the complexity of the knee. Tests to make a diagnosis may include: °· Your caregiver taking a history and doing a physical exam. °· Routine X-rays can be used to rule out other problems. X-rays will not reveal a cartilage tear. Some injuries of the knee can be diagnosed by: °¨ Arthroscopy a surgical technique by which a small video camera is inserted through tiny incisions on the sides of the knee. This procedure is used to examine and repair internal knee joint problems. Tiny instruments can be used during arthroscopy to repair the torn knee cartilage (meniscus). °¨ Arthrography  is a radiology technique. A contrast liquid is directly injected into the knee joint. Internal structures of the knee joint then become visible on X-ray film. °¨ An MRI scan is a non X-ray radiology procedure in which magnetic fields and a computer produce two- or three-dimensional images of the inside of the knee. Cartilage tears are often visible using an MRI scanner. MRI scans have largely replaced arthrography in diagnosing cartilage tears of the knee. °· Blood work. °· Examination of the fluid that helps to lubricate the knee joint (synovial fluid). This is done by taking a sample out using a needle and a syringe. °TREATMENT °The treatment of knee problems depends on the cause. Some of these treatments are: °· Depending on the injury, proper casting, splinting, surgery, or physical therapy care will be needed. °· Give yourself adequate recovery time. Do not overuse your joints. If you begin to get sore during workout routines, back off. Slow down or do fewer repetitions. °· For repetitive activities such as cycling or running, maintain your strength and nutrition. °· Alternate muscle groups. For example, if you are a weight lifter, work the upper body on one day and the lower body the next. °· Either tight or weak muscles do not give the proper support for your knee. Tight or weak muscles do not absorb the stress placed on the knee joint. Keep the muscles surrounding the knee strong. °· Take care of mechanical problems. °¨ If you have flat feet, orthotics or special shoes may help.   See your caregiver if you need help. °¨ Arch supports, sometimes with wedges on the inner or outer aspect of the heel, can help. These can shift pressure away from the side of the knee most bothered by osteoarthritis. °¨ A brace called an "unloader" brace also may be used to help ease the pressure on the most arthritic side of the knee. °· If your caregiver has prescribed crutches, braces, wraps or ice, use as directed. The acronym  for this is PRICE. This means protection, rest, ice, compression, and elevation. °· Nonsteroidal anti-inflammatory drugs (NSAIDs), can help relieve pain. But if taken immediately after an injury, they may actually increase swelling. Take NSAIDs with food in your stomach. Stop them if you develop stomach problems. Do not take these if you have a history of ulcers, stomach pain, or bleeding from the bowel. Do not take without your caregiver's approval if you have problems with fluid retention, heart failure, or kidney problems. °· For ongoing knee problems, physical therapy may be helpful. °· Glucosamine and chondroitin are over-the-counter dietary supplements. Both may help relieve the pain of osteoarthritis in the knee. These medicines are different from the usual anti-inflammatory drugs. Glucosamine may decrease the rate of cartilage destruction. °· Injections of a corticosteroid drug into your knee joint may help reduce the symptoms of an arthritis flare-up. They may provide pain relief that lasts a few months. You may have to wait a few months between injections. The injections do have a small increased risk of infection, water retention, and elevated blood sugar levels. °· Hyaluronic acid injected into damaged joints may ease pain and provide lubrication. These injections may work by reducing inflammation. A series of shots may give relief for as long as 6 months. °· Topical painkillers. Applying certain ointments to your skin may help relieve the pain and stiffness of osteoarthritis. Ask your pharmacist for suggestions. Many over the-counter products are approved for temporary relief of arthritis pain. °· In some countries, doctors often prescribe topical NSAIDs for relief of chronic conditions such as arthritis and tendinitis. A review of treatment with NSAID creams found that they worked as well as oral medications but without the serious side effects. °PREVENTION °· Maintain a healthy weight. Extra pounds  put more strain on your joints. °· Get strong, stay limber. Weak muscles are a common cause of knee injuries. Stretching is important. Include flexibility exercises in your workouts. °· Be smart about exercise. If you have osteoarthritis, chronic knee pain or recurring injuries, you may need to change the way you exercise. This does not mean you have to stop being active. If your knees ache after jogging or playing basketball, consider switching to swimming, water aerobics, or other low-impact activities, at least for a few days a week. Sometimes limiting high-impact activities will provide relief. °· Make sure your shoes fit well. Choose footwear that is right for your sport. °· Protect your knees. Use the proper gear for knee-sensitive activities. Use kneepads when playing volleyball or laying carpet. Buckle your seat belt every time you drive. Most shattered kneecaps occur in car accidents. °· Rest when you are tired. °SEEK MEDICAL CARE IF:  °You have knee pain that is continual and does not seem to be getting better.  °SEEK IMMEDIATE MEDICAL CARE IF:  °Your knee joint feels hot to the touch and you have a high fever. °MAKE SURE YOU:  °· Understand these instructions. °· Will watch your condition. °· Will get help right away if you are not   doing well or get worse. °Document Released: 03/24/2007 Document Revised: 08/19/2011 Document Reviewed: 03/24/2007 °ExitCare® Patient Information ©2015 [employer]. This information is not intended to replace advice given to you by your health care provider. Make sure you discuss any questions you have with your health care provider. ° ° °Emergency Department Resource Guide °1) Find a Doctor and Pay Out of Pocket °Although you won't have to find out who is covered by your insurance plan, it is a good idea to ask around and get recommendations. You will then need to call the office and see if the doctor you have chosen will accept you as a new patient and what types of options  they offer for patients who are self-pay. Some doctors offer discounts or will set up payment plans for their patients who do not have insurance, but you will need to ask so you aren't surprised when you get to your appointment. ° °2) Contact Your Local Health Department °Not all health departments have doctors that can see patients for sick visits, but many do, so it is worth a call to see if yours does. If you don't know where your local health department is, you can check in your phone book. The CDC also has a tool to help you locate your state's health department, and many state websites also have listings of all of their local health departments. ° °3) Find a Walk-in Clinic °If your illness is not likely to be very severe or complicated, you may want to try a walk in clinic. These are popping up all over the country in pharmacies, drugstores, and shopping centers. They're usually staffed by nurse practitioners or physician assistants that have been trained to treat common illnesses and complaints. They're usually fairly quick and inexpensive. However, if you have serious medical issues or chronic medical problems, these are probably not your best option. ° °No Primary Care Doctor: °- Call [facility] at  832-8000 - they can help you locate a primary care doctor that  accepts your insurance, provides certain services, etc. °- Physician Referral Service- 1-800-533-3463 ° °Chronic Pain Problems: °Organization         Address  Phone   Notes  °[facility]  (336) 297-2271 Patients need to be referred by their primary care doctor.  ° °Medication Assistance: °Organization         Address  Phone   Notes  °[place] [facility] [address] °[place], [place] [address] (336) 641-8030 --Must be a resident of [place] °-- Must have NO insurance coverage whatsoever (no Medicaid/ Medicare, etc.) °-- The pt. MUST have a primary care doctor that directs their  care regularly and follows them in the community °  °[facility]  (866) 331-1348   °[employer]  (888) 892-1162   ° °Agencies that provide inexpensive medical care: °Organization         Address  Phone   Notes  °[facility]  (336) 832-8035   °[facility]    (336) 832-7272   °[facility] [address] °[place], [place] [address] (336) 832-4777   °[facility] [address], °[place] (336) 271-4999   °[facility]    (336) 373-0678   °[facility]    (336) 272-1050   °[facility] ° [address],  Phone:  (336) 832-4444, Fax:  (336) 832-4440 Hours of Operation:  9   am - 6 pm, M-F.  Also accepts Medicaid/Medicare and self-pay.  °[facility] ° [address], [place] Phone: (336) 832-3150, Fax: (336) [phone]. Hours of Operation:  8:30 am - 5:30 pm, M-F.  Also accepts Medicaid and self-pay.  °[facility] [place] [address], [place] Phone: (336) 878-6027   °[facility] [address], [place], [place] (336)723-1848, Ext. 123 Mondays & Thursdays: 7-9 AM.  First 15 patients are seen on a first come, first serve basis. °  ° °Medicaid-accepting [place] Providers: ° °Organization         Address  Phone   Notes  °[facility] [address], [place] (336) 641-2100 Also accepts self-pay patients.  °[facility] [address], [place] ° (336) 856-9996   °[facility] [address], [place] (336) 288-8857   °[facility] [address] [place] [address], [place] (336) 299-7000   °Veita Bland [address], [place]  ° (336) 373-1557 Only accepts [place] Access Medicaid patients after they have their name applied to their card.  ° °Self-Pay (no insurance) in [place]: ° °Organization         Address  Phone    Notes  °Sickle Cell Patients, [facility] [address], [place] (336) 832-1970   °[facility] [address], [place] (336) 832-4400   °[facility] ° [address], [place] (336) 992-4800   °[facility]/Dr. Osei-Bonsu ° [address] [place] [address], [place] or [address], [place] (336) [phone] Phone number for both [place] and [place] locations is the same.  °[facility] [address], [place] (336) 299-0000   °[facility] [address] [place] [address], [place] or [address] (336) 852-7530 °(336) 878-2260   °[facility] [address], [place] (336) 350-1642, phone; (336) 294-5005, fax Sees patients 1st and 3rd Saturday of every month.  Must not qualify for public or private insurance (i.e. Medicaid, Medicare, [place] Health Choice, Veterans' Benefits) • Household income should be no more than 200% of the poverty level •The clinic cannot treat you if you are pregnant or think you are pregnant • Sexually transmitted diseases are not treated at the clinic.  ° ° °Dental Care: °Organization         Address  Phone  Notes  °[facility] [address],  (336) 641-6152 Accepts children up to age 21 who are enrolled in Medicaid or [place] Health Choice; pregnant women with a Medicaid card; and children who have applied for Medicaid or [place] Health Choice, but were declined, whose parents can pay a reduced fee at time of service.  °[facility] [place]  [address], [place] (336) 641-7733 Accepts children up to age 21 who are enrolled in Medicaid or [place] Health Choice; pregnant women with a Medicaid card; and children who have applied for Medicaid or [place] Health Choice, but were declined, whose parents can pay a reduced fee at time of service.  °[facility] ° [address],  (336) 641-4533 Patients are seen by appointment only. Walk-ins are not accepted. [facility] will see patients 18 years of   age and older. °Monday - Tuesday (8am-5pm) °Most Wednesdays (8:30-5pm) °$30 per visit, cash only  °[facility] ° [address], [place] (336) 641-4533 Patients are seen by appointment only. Walk-ins are not accepted. [facility] will see patients 18 years of age and older. °One Wednesday Evening (Monthly: Volunteer Based).  $30 per visit, cash only  °[facility]  (919) 537-3737 for adults; Children under age 4, call Graduate Pediatric Dentistry at ([phone]) [phone]. Children aged 4-14, please call ([phone]) [phone] to request a pediatric application. ° Dental services are provided in all areas of dental care including fillings, crowns and bridges, complete and partial dentures, implants, gum treatment, root canals, and extractions. Preventive care is also provided. Treatment is provided to both adults and children. °Patients are selected via a lottery and there is often a waiting list. °  °[facility] [address], °[place] ° (336) 763-8833 [email or web address] °  °[facility] [address], [place], [place] (336)723-1848, Ext. 123 Second and Fourth Thursday of each month, opens at 6:30 AM; Clinic ends at 9 AM.  Patients are seen on a first-come first-served basis, and a limited number are seen during each clinic.  ° °[facility] ° [address], [place], [place] (336) 723-7904   Eligibility Requirements °You must have lived in [place], [place], or [place] counties for at least the last three months. °  You cannot be eligible for state or federal sponsored healthcare insurance, including Veterans Administration, Medicaid, or Medicare. °  You generally cannot be eligible for healthcare insurance through your employer.  °  How to  apply: °Eligibility screenings are held every Tuesday and Wednesday afternoon from 1:00 pm until 4:00 pm. You do not need an appointment for the interview!  °[facility] [address], [place], [place] 336-631-2330   °[facility]  336-342-8273   °[facility]  336-703-3100   °[facility]  336-570-6415   ° °Behavioral Health Resources in the Community: °Intensive Outpatient Programs °Organization         Address  Phone  Notes  °[facility] [address], [place], [place] 336-878-6098   °[facility] [address], [place], [place] 336-832-9800   °[facility] [address], [place], [place] ° 336-882-2125   °[facility] [address],  °[place], [place] 1-800-853-5163 or 336-641-4981   °Substance Abuse Resources °Organization         Address  Phone  Notes  °[facility]  336-882-2125   °[facility]  336-784-9470   °[facility]  336-285-9073   °[facility]  336-845-3988   °Residential & Outpatient Substance Abuse Program  1-800-659-3381   °Psychological Services °Organization         Address  Phone  Notes  °[facility]  336- 832-9600   °[facility]  336- 378-7881   °[facility] [address], [place] 1-800-853-5163 or 336-641-4981   ° °Mobile Crisis Teams °Organization         Address  Phone  Notes  °[facility], Mobile Crisis Care Unit  1-877-626-1772   °Assertive °[facility] ° [address] [place], [place] 336-834-9664   °Sharon DeEsch [address] °  336-554-5454   ° °Self-Help/Support Groups °Organization         Address    Phone             Notes  °[facility] - variety of support groups  336- 373-1402 Call for more information  °Narcotics Anonymous (NA), [facility] [address], °[place]  2 meetings at this location  ° °Residential Treatment Programs °Organization         Address  Phone  Notes  °[facility] [address],    °[place]  1-866-801-8205   °[facility] ° [name], [place], [place] 704-293-8524   °[facility] [address], [place] 336-845-3988 Admissions: 8am-3pm M-F  °[facility] [address]    °[place], [place] 336-841-1104   °[facility] [address], [place], [place] 336-379-7146   °[facility] [address]  °[place], [place] 336-285-9073   °[facility] Intensive Outpatient [address], [place], [place] 336-852-3033   °[facility] [address]  °[place], [place] 1-877-615-2722 or 336-784-9470   °[facility] [address] [place], [place] 336-227-7417 Accepts Medicaid  °[facility] [address]  °[place] 1-800-659-3381 Substance Abuse/Addiction Treatment  ° °[place] Behavioral Health Resources °Organization         Address  Phone  Notes  °[facility]  (888) 581-9988   °Julie Brannon, PhD [address] [place], [place]   (336) 349-5553 or (336) 951-0000   °[facility]   [address] °[place], [place] (336) 349-4454   °[facility] [address], [place], [place] (336) 342-8316 Insurance/Medicaid/sponsorship through [facility]  °[facility] [address]                                    [place], [place] (336) 342-8316 Therapy/tele-psych/case  °[facility] [address]  ° [place], [place] (336) 349-2233    °Dr. Arfeen  (336) 349-4544   °[facility] [place]  [employer] [place] [facility] 1) [address], [place] °2) [address], [place] °3)  [address], [place] (336) 349-3220 °(336) 342-7768 ° °(336) 342-8140   °[place] [facility]  (336) 342-1394 or (336) 342-3537 (After Hours)    ° ° ° °

## 2014-06-05 NOTE — ED Notes (Signed)
Pt arrived to the ED with a complaint of left knee pain.  Pt states the pain has been present for a week.  Pt denies any injury or trauma to the knee.  Pt states she is active in walking and using steps for work.  Pt states today knee has become unbearable and pt has been unable to put weight on it consistently

## 2015-01-13 ENCOUNTER — Emergency Department (HOSPITAL_COMMUNITY)
Admission: EM | Admit: 2015-01-13 | Discharge: 2015-01-13 | Disposition: A | Discharge status: Home / Self Care | Payer: BLUE CROSS/BLUE SHIELD | Attending: Emergency Medicine | Admitting: Emergency Medicine

## 2015-01-13 ENCOUNTER — Encounter (HOSPITAL_COMMUNITY): Payer: Self-pay | Admitting: *Deleted

## 2015-01-13 DIAGNOSIS — K529 Noninfective gastroenteritis and colitis, unspecified: Secondary | ICD-10-CM | POA: Insufficient documentation

## 2015-01-13 DIAGNOSIS — Z720 Tobacco use: Secondary | ICD-10-CM | POA: Diagnosis not present

## 2015-01-13 DIAGNOSIS — R1110 Vomiting, unspecified: Secondary | ICD-10-CM | POA: Diagnosis present

## 2015-01-13 DIAGNOSIS — E669 Obesity, unspecified: Secondary | ICD-10-CM | POA: Diagnosis not present

## 2015-01-13 DIAGNOSIS — I10 Essential (primary) hypertension: Secondary | ICD-10-CM | POA: Diagnosis not present

## 2015-01-13 DIAGNOSIS — R197 Diarrhea, unspecified: Secondary | ICD-10-CM

## 2015-01-13 DIAGNOSIS — R112 Nausea with vomiting, unspecified: Secondary | ICD-10-CM

## 2015-01-13 LAB — COMPREHENSIVE METABOLIC PANEL
ALK PHOS: 49 U/L (ref 38–126)
ALT: 11 U/L — ABNORMAL LOW (ref 14–54)
ANION GAP: 5 (ref 5–15)
AST: 14 U/L — AB (ref 15–41)
Albumin: 3.4 g/dL — ABNORMAL LOW (ref 3.5–5.0)
BILIRUBIN TOTAL: 0.3 mg/dL (ref 0.3–1.2)
BUN: 9 mg/dL (ref 6–20)
CHLORIDE: 105 mmol/L (ref 101–111)
CO2: 27 mmol/L (ref 22–32)
Calcium: 8.7 mg/dL — ABNORMAL LOW (ref 8.9–10.3)
Creatinine, Ser: 0.56 mg/dL (ref 0.44–1.00)
GFR calc Af Amer: >60 mL/min (ref >60)
GFR calc non Af Amer: >60 mL/min (ref >60)
Glucose, Bld: 91 mg/dL (ref 65–99)
Potassium: 3.5 mmol/L (ref 3.5–5.1)
Sodium: 137 mmol/L (ref 135–145)
Total Protein: 6.7 g/dL (ref 6.5–8.1)

## 2015-01-13 LAB — CBC
HCT: 39.8 % (ref 36.0–46.0)
Hemoglobin: 13.2 g/dL (ref 12.0–15.0)
MCH: 29.1 pg (ref 26.0–34.0)
MCHC: 33.2 g/dL (ref 30.0–36.0)
MCV: 87.7 fL (ref 78.0–100.0)
PLATELETS: 191 10*3/uL (ref 150–400)
RBC: 4.54 MIL/uL (ref 3.87–5.11)
RDW: 14.5 % (ref 11.5–15.5)
WBC: 10 10*3/uL (ref 4.0–10.5)

## 2015-01-13 LAB — URINE MICROSCOPIC-ADD ON

## 2015-01-13 LAB — URINALYSIS, ROUTINE W REFLEX MICROSCOPIC
Bilirubin Urine: NEGATIVE
Glucose, UA: NEGATIVE mg/dL
KETONES UR: NEGATIVE mg/dL
NITRITE: NEGATIVE
PH: 6.5 (ref 5.0–8.0)
PROTEIN: NEGATIVE mg/dL
Specific Gravity, Urine: 1.019 (ref 1.005–1.030)
UROBILINOGEN UA: 1 mg/dL (ref 0.0–1.0)

## 2015-01-13 LAB — LIPASE, BLOOD: Lipase: 20 U/L — ABNORMAL LOW (ref 22–51)

## 2015-01-13 MED ORDER — SODIUM CHLORIDE 0.9 % IV BOLUS (SEPSIS)
1000.0000 mL | Freq: Once | INTRAVENOUS | Status: AC
  Administered 2015-01-13: 1000 mL via INTRAVENOUS

## 2015-01-13 NOTE — ED Provider Notes (Signed)
History   Chief Complaint  Patient presents with  . Emesis  . Diarrhea    HPI 49 year old female with past medical history as below presents to ED for 3 days of GI illness. Patient reports having several bouts of nonbilious nonbloody emesis as well as watery diarrhea. 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 She denies having any blood in her diarrhea, fevers, chills, abdominal pain. Patient reports her symptoms are starting to improve. She reports only having 2 episodes of diarrhea today and no episodes of vomiting. She denies any recent antibody use. Onset was gradual. No modifying factors. Severity is rated as mild. Denies any urinary symptoms. No other complaints this time.  Past medical/surgical history, social history, medications, allergies and FH have been reviewed with patient and/or in documentation. Furthermore, if pt family or friend(s) present, additional historical information was obtained from them.  Past Medical History  Diagnosis Date  . Hypertension   . Obesity    Past Surgical History  Procedure Laterality Date  . Ectopic pregnancy surgery      2   History reviewed. No pertinent family history. History  Substance Use Topics  . Smoking status: Current Every Day Smoker -- 1.00 packs/day    Types: Cigarettes  . Smokeless tobacco: Never Used  . Alcohol Use: Yes     Comment: twice a year     Review of Systems Constitutional: - F/C, -fatigue.  HENT: - congestion, -rhinorrhea, -sore throat.   Eyes: - eye pain, -visual disturbance.  Respiratory: - cough, -SOB, -hemoptysis.   Cardiovascular: - CP, -palps.  Gastrointestinal: + N/V/D, -abd pain  Genitourinary: - flank pain, -dysuria, -frequency.  Musculoskeletal: - myalgia/arthritis, -joint swelling, -gait abnormality, -back pain, -neck pain/stiffness, -leg pain/swelling.  Skin: - rash/lesion.  Neurological: - focal weakness, -lightheadedness,  -dizziness, -numbness, -HA.  All other systems reviewed and are negative.   Physical Exam  Physical Exam  ED Triage Vitals  Enc Vitals Group     BP 01/13/15 1109 205/85 mmHg     Pulse Rate 01/13/15 1109 73     Resp 01/13/15 1109 18     Temp 01/13/15 1109 98.1 F (36.7 C)     Temp Source 01/13/15 1109 Oral     SpO2 01/13/15 1109 96 %     Weight --      Height --      Head Cir --      Peak Flow --      Pain Score --      Pain Loc --      Pain Edu? --      Excl. in GC? --    Constitutional: Patient is well appearing and in no acute distress Head: Normocephalic and atraumatic.  Eyes: Extraocular motion intact, no scleral icterus Mouth: MMM, OP clear Neck: Supple without meningismus, mass, or overt JVD Respiratory: No respiratory distress. Normal WOB. No w/r/g. CV: RRR, no obvious murmurs.  Pulses +2 and symmetric. Euvolemic Abdomen: Soft, NT, ND, no r/g. No mass.  MSK: Extremities are atraumatic without deformity, ROM intact Skin: Warm, dry, intact without rash Neuro: AAOx4, MAE 5/5 sym, no focal deficit noted   ED Course  Procedures   Labs Reviewed  LIPASE, BLOOD - Abnormal; Notable for the following:    Lipase 20 (*)    All other components within normal limits  COMPREHENSIVE METABOLIC PANEL - Abnormal; Notable for the following:    Calcium 8.7 (*)  Albumin 3.4 (*)    AST 14 (*)    ALT 11 (*)    All other components within normal limits  URINALYSIS, ROUTINE W REFLEX MICROSCOPIC (NOT AT [facility]) - Abnormal; Notable for the following:    Hgb urine dipstick LARGE (*)    Leukocytes, UA SMALL (*)    All other components within normal limits  URINE MICROSCOPIC-ADD ON - Abnormal; Notable for the following:    Squamous Epithelial / LPF FEW (*)    Bacteria, UA FEW (*)    All other components within normal limits  CBC   I personally reviewed and interpreted all labs.  No results found. I personally viewed above image(s) which were used in my medical decision  making. Formal interpretations by Radiology.   EKG Interpretation  Date/Time:    Ventricular Rate:    PR Interval:    QRS Duration:   QT Interval:    QTC Calculation:   R Axis:     Text Interpretation:         MDM: PAYGE EPPES is a 49 y.o. female with H&P as above who p/w CC: GI illness  Patient is well hydrated, well appearing and in no apparent distress. No fever in ED. Benign abdominal exam. Patient will receive IV fluids.  Old records reviewed (if available). Labs and imaging reviewed personally by myself and considered in medical decision making if ordered.  Clinical Impression: 1. Nausea vomiting and diarrhea   2. Gastroenteritis     Disposition: Discharge  Condition: Good  I have discussed the results, Dx and Tx plan with the pt(& family if present). He/she/they expressed understanding and agree(s) with the plan. Discharge instructions discussed at great length. Strict return precautions discussed and pt &/or family have verbalized understanding of the instructions. No further questions at time of discharge.    New Prescriptions   No medications on file    Follow Up: [facility]     [address] [place] [address] [phone]  As needed  [facility] EMERGENCY DEPARTMENT [address] [id number] [place] [address] [phone]  If symptoms worsen   Pt seen in conjunction with Dr. Mirian Mo, MD  Ames Dura, DO [facility] Emergency Medicine Resident - PGY-3      Ames Dura, MD 01/13/15 1322  Mirian Mo, MD 01/13/15 [phone]

## 2015-01-13 NOTE — ED Notes (Signed)
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 bp 205/85 at triage, reports hx of HTN but doesn't take meds.

## 2015-02-28 ENCOUNTER — Emergency Department (HOSPITAL_COMMUNITY)
Admission: EM | Admit: 2015-02-28 | Discharge: 2015-02-28 | Disposition: A | Discharge status: Home / Self Care | Payer: BLUE CROSS/BLUE SHIELD | Attending: Physician Assistant | Admitting: Physician Assistant

## 2015-02-28 ENCOUNTER — Encounter (HOSPITAL_COMMUNITY): Payer: Self-pay | Admitting: Emergency Medicine

## 2015-02-28 DIAGNOSIS — Z720 Tobacco use: Secondary | ICD-10-CM | POA: Diagnosis not present

## 2015-02-28 DIAGNOSIS — E669 Obesity, unspecified: Secondary | ICD-10-CM | POA: Insufficient documentation

## 2015-02-28 DIAGNOSIS — I10 Essential (primary) hypertension: Secondary | ICD-10-CM | POA: Diagnosis not present

## 2015-02-28 DIAGNOSIS — M25551 Pain in right hip: Secondary | ICD-10-CM | POA: Insufficient documentation

## 2015-02-28 DIAGNOSIS — M545 Low back pain, unspecified: Secondary | ICD-10-CM

## 2015-02-28 DIAGNOSIS — M62830 Muscle spasm of back: Secondary | ICD-10-CM

## 2015-02-28 MED ORDER — CYCLOBENZAPRINE HCL 10 MG PO TABS: 10.0000 mg | ORAL_TABLET | Freq: Three times a day (TID) | ORAL | Status: DC | PRN

## 2015-02-28 MED ORDER — CYCLOBENZAPRINE HCL 10 MG PO TABS
10.0000 mg | ORAL_TABLET | Freq: Once | ORAL | Status: AC
  Administered 2015-02-28: 10 mg via ORAL
  Filled 2015-02-28: qty 1

## 2015-02-28 NOTE — ED Notes (Signed)
Pt c/o low back and sharp  r/hip pain x 1 week. Tx by PCP with Diclofenac. No pain relief. Increased pain r/hip today. Unable to be seen by PCP

## 2015-02-28 NOTE — Discharge Instructions (Signed)
Back Pain: °Your back pain should be treated with medicines such as ibuprofen or aleve and this back pain should get better over the next 2 weeks.  However if you develop severe or worsening pain, low back pain with fever, numbness, weakness or inability to walk or urinate, you should return to the ER immediately.  Please follow up with your doctor this week for a recheck if still having symptoms. Avoid heavy lifting over 10 pounds for the next 2 weeks.  ° °Low back pain is discomfort in the lower back that may be due to injuries to muscles and ligaments around the spine.  Occasionally, it may be caused by a a problem to a part of the spine called a disc.  The pain may last several days or a week;  However, most patients get completely well in 4 weeks. ° °Self - care:  The application of heat can help soothe the pain.  Maintaining your daily activities, including walking, is encourged, as it will help you get better faster than just staying in bed. Perform gentle stretching as discussed. Drink plenty of fluids. ° °Medications are also useful to help with pain control.  A commonly prescribed medication includes tylenol. ° °Non steroidal anti inflammatory medications including Ibuprofen and naproxen or diclofenac;  These medications help both pain and swelling and are very useful in treating back pain.  They should be taken with food, as they can cause stomach upset, and more seriously, stomach bleeding.   ° °Muscle relaxants:  These medications can help with muscle tightness that is a cause of lower back pain.  Most of these medications can cause drowsiness, and it is not safe to drive or use dangerous machinery while taking them. ° °SEEK IMMEDIATE MEDICAL ATTENTION IF: °New numbness, tingling, weakness, or problem with the use of your arms or legs.  °Severe back pain not relieved with medications.  °Difficulty with or loss of control of your bowel or bladder control.  °Increasing pain in any areas of the body (such  as chest or abdominal pain).  °Shortness of breath, dizziness or fainting.  °Nausea (feeling sick to your stomach), vomiting, fever, or sweats. ° °You will need to follow up with  Your primary healthcare provider in 1-2 weeks for reassessment. ° ° °Back Pain, Adult °Back pain is very common. The pain often gets better over time. The cause of back pain is usually not dangerous. Most people can learn to manage their back pain on their own.  °HOME CARE  °· Stay active. Start with short walks on flat ground if you can. Try to walk farther each day. °· Do not sit, drive, or stand in one place for more than 30 minutes. Do not stay in bed. °· Do not avoid exercise or work. Activity can help your back heal faster. °· Be careful when you bend or lift an object. Bend at your knees, keep the object close to you, and do not twist. °· Sleep on a firm mattress. Lie on your side, and bend your knees. If you lie on your back, put a pillow under your knees. °· Only take medicines as told by your doctor. °· Put ice on the injured area. °· Put ice in a plastic bag. °· Place a towel between your skin and the bag. °· Leave the ice on for 15-20 minutes, 03-04 times a day for the first 2 to 3 days. After that, you can switch between ice and heat packs. °· Ask your   doctor about back exercises or massage. °· Avoid feeling anxious or stressed. Find good ways to deal with stress, such as exercise. °GET HELP RIGHT AWAY IF:  °· Your pain does not go away with rest or medicine. °· Your pain does not go away in 1 week. °· You have new problems. °· You do not feel well. °· The pain spreads into your legs. °· You cannot control when you poop (bowel movement) or pee (urinate). °· Your arms or legs feel weak or lose feeling (numbness). °· You feel sick to your stomach (nauseous) or throw up (vomit). °· You have belly (abdominal) pain. °· You feel like you may pass out (faint). °MAKE SURE YOU:  °· Understand these instructions. °· Will watch your  condition. °· Will get help right away if you are not doing well or get worse. °Document Released: 11/13/2007 Document Revised: 08/19/2011 Document Reviewed: 09/28/2013 °Exit[employer]® Patient Information ©2015 [employer]. This information is not intended to replace advice given to you by your health care provider. Make sure you discuss any questions you have with your health care provider. ° °Back Exercises °Back exercises help treat and prevent back injuries. The goal is to increase your strength in your belly (abdominal) and back muscles. These exercises can also help with flexibility. Start these exercises when told by your doctor. °HOME CARE °Back exercises include: °Pelvic Tilt. °· Lie on your back with your knees bent. Tilt your pelvis until the lower part of your back is against the floor. Hold this position 5 to 10 sec. Repeat this exercise 5 to 10 times. °Knee to Chest. °· Pull 1 knee up against your chest and hold for 20 to 30 seconds. Repeat this with the other knee. This may be done with the other leg straight or bent, whichever feels better. Then, pull both knees up against your chest. °Sit-Ups or Curl-Ups. °· Bend your knees 90 degrees. Start with tilting your pelvis, and do a partial, slow sit-up. Only lift your upper half 30 to 45 degrees off the floor. Take at least 2 to 3 seonds for each sit-up. Do not do sit-ups with your knees out straight. If partial sit-ups are difficult, simply do the above but with only tightening your belly (abdominal) muscles and holding it as told. °Hip-Lift. °· Lie on your back with your knees flexed 90 degrees. Push down with your feet and shoulders as you raise your hips 2 inches off the floor. Hold for 10 seconds, repeat 5 to 10 times. °Back Arches. °· Lie on your stomach. Prop yourself up on bent elbows. Slowly press on your hands, causing an arch in your low back. Repeat 3 to 5 times. °Shoulder-Lifts. °· Lie face down with arms beside your body. Keep hips and belly  pressed to floor as you slowly lift your head and shoulders off the floor. °Do not overdo your exercises. Be careful in the beginning. Exercises may cause you some mild back discomfort. If the pain lasts for more than 15 minutes, stop the exercises until you see your doctor. Improvement with exercise for back problems is slow.  °Document Released: 06/29/2010 Document Revised: 08/19/2011 Document Reviewed: 03/28/2011 °[employer]® Patient Information ©2015 [employer]. This information is not intended to replace advice given to you by your health care provider. Make sure you discuss any questions you have with your health care provider. ° °Back Injury Prevention °Back injuries can be extremely painful and difficult to heal. After having one back injury, you are much   more likely to experience another later on. It is important to learn how to avoid injuring or re-injuring your back. The following tips can help you to prevent a back injury. °PHYSICAL FITNESS °· Exercise regularly and try to develop good tone in your abdominal muscles. Your abdominal muscles provide a lot of the support needed by your back. °· Do aerobic exercises (walking, jogging, biking, swimming) regularly. °· Do exercises that increase balance and strength (tai chi, yoga) regularly. This can decrease your risk of falling and injuring your back. °· Stretch before and after exercising. °· Maintain a healthy weight. The more you weigh, the more stress is placed on your back. For every pound of weight, 10 times that amount of pressure is placed on the back. °DIET °· Talk to your caregiver about how much calcium and vitamin D you need per day. These nutrients help to prevent weakening of the bones (osteoporosis). Osteoporosis can cause broken (fractured) bones that lead to back pain. °· Include good sources of calcium in your diet, such as dairy products, green, leafy vegetables, and products with calcium added (fortified). °· Include good sources of  vitamin D in your diet, such as milk and foods that are fortified with vitamin D. °· Consider taking a nutritional supplement or a multivitamin if needed. °· Stop smoking if you smoke. °POSTURE °· Sit and stand up straight. Avoid leaning forward when you sit or hunching over when you stand. °· Choose chairs with good low back (lumbar) support. °· If you work at a desk, sit close to your work so you do not need to lean over. Keep your chin tucked in. Keep your neck drawn back and elbows bent at a right angle. Your arms should look like the letter "L." °· Sit high and close to the steering wheel when you drive. Add a lumbar support to your car seat if needed. °· Avoid sitting or standing in one position for too long. Take breaks to get up, stretch, and walk around at least once every hour. Take breaks if you are driving for long periods of time. °· Sleep on your side with your knees slightly bent, or sleep on your back with a pillow under your knees. Do not sleep on your stomach. °LIFTING, TWISTING, AND REACHING °· Avoid heavy lifting, especially repetitive lifting. If you must do heavy lifting: °· Stretch before lifting. °· Work slowly. °· Rest between lifts. °· Use carts and dollies to move objects when possible. °· Make several small trips instead of carrying 1 heavy load. °· Ask for help when you need it. °· Ask for help when moving big, awkward objects. °· Follow these steps when lifting: °· Stand with your feet shoulder-width apart. °· Get as close to the object as you can. Do not try to pick up heavy objects that are far from your body. °· Use handles or lifting straps if they are available. °· Bend at your knees. Squat down, but keep your heels off the floor. °· Keep your shoulders pulled back, your chin tucked in, and your back straight. °· Lift the object slowly, tightening the muscles in your legs, abdomen, and buttocks. Keep the object as close to the center of your body as possible. °· When you put a load  down, use these same guidelines in reverse. °· Do not: °· Lift the object above your waist. °· Twist at the waist while lifting or carrying a load. Move your feet if you need to turn, not your   waist.  Bend over without bending at your knees.  Avoid reaching over your head, across a table, or for an object on a high surface. OTHER TIPS  Avoid wet floors and keep sidewalks clear of ice to prevent falls.  Do not sleep on a mattress that is too soft or too hard.  Keep items that are used frequently within easy reach.  Put heavier objects on shelves at waist level and lighter objects on lower or higher shelves.  Find ways to decrease your stress, such as exercise, massage, or relaxation techniques. Stress can build up in your muscles. Tense muscles are more vulnerable to injury.  Seek treatment for depression or anxiety if needed. These conditions can increase your risk of developing back pain. SEEK MEDICAL CARE IF:  You injure your back.  You have questions about diet, exercise, or other ways to prevent back injuries. MAKE SURE YOU:  Understand these instructions.  Will watch your condition.  Will get help right away if you are not doing well or get worse. Document Released: 07/04/2004 Document Revised: 08/19/2011 Document Reviewed: 07/08/2011 [facility] Patient Information 2015 [place], [place]. This information is not intended to replace advice given to you by your health care provider. Make sure you discuss any questions you have with your health care provider.  Heat Therapy Heat therapy can help make painful, stiff muscles and joints feel better. Do not use heat on new injuries. Wait at least 48 hours after an injury to use heat. Do not use heat when you have aches or pains right after an activity. If you still have pain 3 hours after stopping the activity, then you may use heat. HOME CARE Wet heat pack  Soak a clean towel in warm water. Squeeze out the extra water.  Put the  warm, wet towel in a plastic bag.  Place a thin, dry towel between your skin and the bag.  Put the heat pack on the area for 5 minutes, and check your skin. Your skin may be pink, but it should not be red.  Leave the heat pack on the area for 15 to 30 minutes.  Repeat this every 2 to 4 hours while awake. Do not use heat while you are sleeping. Warm water bath  Fill a tub with warm water.  Place the affected body part in the tub.  Soak the area for 20 to 40 minutes.  Repeat as needed. Hot water bottle  Fill the water bottle half full with hot water.  Press out the extra air. Close the cap tightly.  Place a dry towel between your skin and the bottle.  Put the bottle on the area for 5 minutes, and check your skin. Your skin may be pink, but it should not be red.  Leave the bottle on the area for 15 to 30 minutes.  Repeat this every 2 to 4 hours while awake. Electric heating pad  Place a dry towel between your skin and the heating pad.  Set the heating pad on low heat.  Put the heating pad on the area for 10 minutes, and check your skin. Your skin may be pink, but it should not be red.  Leave the heating pad on the area for 20 to 40 minutes.  Repeat this every 2 to 4 hours while awake.  Do not lie on the heating pad.  Do not fall asleep while using the heating pad.  Do not use the heating pad near water. GET HELP RIGHT AWAY  IF: °· You get blisters or red skin. °· Your skin is puffy (swollen), or you lose feeling (numbness) in the affected area. °· You have any new problems. °· Your problems are getting worse. °· You have any questions or concerns. °If you have any problems, stop using heat therapy until you see your doctor. °MAKE SURE YOU: °· Understand these instructions. °· Will watch your condition. °· Will get help right away if you are not doing well or get worse. °Document Released: 08/19/2011 Document Reviewed: 07/20/2013 °[employer]® Patient Information ©2015  [employer]. This information is not intended to replace advice given to you by your health care provider. Make sure you discuss any questions you have with your health care provider. ° °

## 2015-02-28 NOTE — ED Provider Notes (Signed)
CSN: [id number]     Arrival date & time 02/28/15  1152 History  This chart was scribed for non-physician practitioner Allen Derry, PA-C working with Abelino Derrick, MD by Lyndel Safe, ED Scribe. This patient was seen in room WTR5/WTR5 and the patient's care was started at 12:18 PM.   Chief Complaint  Patient presents with  . Back Pain    pt c/o recurrent low back pain  . Hip Pain    recurrent pain in r/hip    Patient is a 49 y.o. female presenting with back pain. The history is provided by the patient. No language interpreter was used.  Back Pain Location:  Lumbar spine Quality:  Stabbing Radiates to:  R thigh Pain severity:  Moderate Pain is:  Same all the time Onset quality:  Gradual Duration:  1 week Timing:  Constant Progression:  Unchanged Chronicity:  New Context: physical stress   Relieved by:  Nothing Worsened by:  Movement Ineffective treatments:  NSAIDs Associated symptoms: no abdominal pain, no bladder incontinence, no bowel incontinence, no chest pain, no dysuria, no fever, no numbness, no paresthesias, no perianal numbness, no tingling and no weakness    HPI Comments: Sharon Dyer is a 49 y.o. female, with a PMHx of obesity and HTN, who presents to the Emergency Department complaining of constant with activity but intermittent with rest, 9/10, sharp, right hip and right lower back pain X 1 week that radiates intermittently down lateral side of right leg, that is worse with movement, not relieved by BID diclofenac prescribed by PCP. She reports she has experienced similar back pain in the past. Pt was also administered a shot that alleviated her pain briefly by her PCP but she is unsure of name of injection. X-rays not obtained at PCP. 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 Denies numbness, tingling, or weakness, bladder or bowel incontinence, flank pain, vaginal bleeding or vaginal discharge, fevers, chills,  nausea, vomiting, diarrhea, abdominal pain, CP, SOB, hematuria, or dysuria.   Past Medical History  Diagnosis Date  . Hypertension   . Obesity    Past Surgical History  Procedure Laterality Date  . Ectopic pregnancy surgery      2   No family history on file. Social History  Substance Use Topics  . Smoking status: Current Every Day Smoker -- 1.00 packs/day    Types: Cigarettes  . Smokeless tobacco: Never Used  . Alcohol Use: Yes     Comment: twice a year   OB History    No data available     Review of Systems  Constitutional: Negative for fever and chills.  Respiratory: Negative for shortness of breath.   Cardiovascular: Negative for chest pain.  Gastrointestinal: Negative for nausea, vomiting, abdominal pain, diarrhea, constipation and bowel incontinence.  Genitourinary: Negative for bladder incontinence, dysuria, hematuria, flank pain, vaginal bleeding and vaginal discharge.  Musculoskeletal: Positive for back pain ( right lower back) and arthralgias ( right hip). Negative for myalgias.  Skin: Negative for color change.  Allergic/Immunologic: Negative for immunocompromised state.  Neurological: Negative for tingling, weakness, numbness and paresthesias.  Psychiatric/Behavioral: Negative for confusion.   10 Systems reviewed and all are negative for acute change except as noted in the HPI.   Allergies  Review of patient's allergies indicates no known allergies.  Home Medications   Prior to Admission medications   Medication Sig Start Date End Date Taking? Authorizing Provider  ibuprofen (ADVIL,MOTRIN) 200 MG  tablet Take 800 mg by mouth every 6 (six) hours as needed for mild pain.    Historical Provider, MD  naproxen (NAPROSYN) 500 MG tablet Take 1 tablet (500 mg total) by mouth 2 (two) times daily with a meal. Patient not taking: Reported on 01/13/2015 06/05/14   Everlene Farrier, PA-C  prednisoLONE acetate (PRED FORTE) 1 % ophthalmic suspension Place 1 drop into both  eyes 4 (four) times daily. Patient not taking: Reported on 06/05/2014 10/08/13   Joni Reining Pisciotta, PA-C   BP 151/74 mmHg  Pulse 68  Resp 20  SpO2 98% Physical Exam  Constitutional: She is oriented to person, place, and time. Vital signs are normal. She appears well-developed and well-nourished.  Non-toxic appearance. No distress.  Afebrile, nontoxic, NAD  HENT:  Head: Normocephalic and atraumatic.  Mouth/Throat: Mucous membranes are normal.  Eyes: Conjunctivae and EOM are normal. Right eye exhibits no discharge. Left eye exhibits no discharge.  Neck: Normal range of motion. Neck supple.  Cardiovascular: Normal rate and intact distal pulses.   Pulmonary/Chest: Effort normal. No respiratory distress.  Abdominal: Normal appearance. She exhibits no distension.  Musculoskeletal: Normal range of motion.       Right hip: She exhibits tenderness. She exhibits normal range of motion, normal strength and no bony tenderness.       Lumbar back: She exhibits tenderness and spasm. She exhibits normal range of motion, no bony tenderness and no deformity.       Back:  Lumbar spine with FROM intact without spinous process TTP, no bony stepoffs or deformities, with mild right-sided paraspinous muscle TTP and palpable muscle spasms. Strength 5/5 in all extremities, sensation grossly intact in all extremities, negative SLR bilaterally, gait steady and nonantalgic. No overlying skin changes, no right hip joint line TTP but mild diffuse muscular TTP.   Neurological: She is alert and oriented to person, place, and time. She has normal strength. No sensory deficit. Gait normal.  Skin: Skin is warm, dry and intact. No rash noted.  Psychiatric: She has a normal mood and affect. Her behavior is normal.  Nursing note and vitals reviewed.   ED Course  Procedures  DIAGNOSTIC STUDIES: Oxygen Saturation is 98% on RA, normal by my interpretation.    COORDINATION OF CARE: 12:25 PM Discussed treatment plan with pt  at bedside which includes to prescribe Flexeril and give a work note. Advised pt to follow up with her PCP and continue taking Diclofenac. Pt acknowledged and agreed to plan.  MDM   Final diagnoses:  Right-sided low back pain without sciatica  Right hip pain  Back spasm    49 y.o. female here with low back pain/R hip pain. No red flag s/s of low back pain. No s/s of central cord compression or cauda equina. Lower extremities are neurovascularly intact and patient is ambulating without difficulty. Mild tenderness to paraspinous muscles, no midline tenderness.  Patient was counseled on back pain precautions and told to do activity as tolerated but do not lift, push, or pull heavy objects more than 10 pounds for the next week. Patient counseled to use ice or heat on back for no longer than 15 minutes every hour.   Rx given for muscle relaxer and counseled on proper use of muscle relaxant medication. Pt has rx for diclofenac. Discussed tylenol usage as well. Urged patient not to drink alcohol, drive, or perform any other activities that requires focus while taking flexeril.   Patient urged to follow-up with PCP if pain does not  improve with treatment and rest or if pain becomes recurrent. Urged to return with worsening severe pain, loss of bowel or bladder control, trouble walking. The patient verbalizes understanding and agrees with the plan.   I personally performed the services described in this documentation, which was scribed in my presence. The recorded information has been reviewed and is accurate.  BP 151/74 mmHg  Pulse 68  Resp 20  SpO2 98%  Meds ordered this encounter  Medications  . cyclobenzaprine (FLEXERIL) tablet 10 mg    Sig:   . cyclobenzaprine (FLEXERIL) 10 MG tablet    Sig: Take 1 tablet (10 mg total) by mouth 3 (three) times daily as needed for muscle spasms.    Dispense:  15 tablet    Refill:  0    Order Specific Question:  Supervising Provider    Answer:  Eber Hong [[id number]]      Mercedes Camprubi-Soms, PA-C 02/28/15 1244  Courteney Lyn Mackuen, MD 02/28/15 1640

## 2015-03-02 ENCOUNTER — Encounter (HOSPITAL_COMMUNITY): Payer: Self-pay | Admitting: Emergency Medicine

## 2015-03-02 ENCOUNTER — Emergency Department (HOSPITAL_COMMUNITY)
Admission: EM | Admit: 2015-03-02 | Discharge: 2015-03-02 | Disposition: A | Discharge status: Home / Self Care | Payer: BLUE CROSS/BLUE SHIELD | Attending: Emergency Medicine | Admitting: Emergency Medicine

## 2015-03-02 DIAGNOSIS — E669 Obesity, unspecified: Secondary | ICD-10-CM | POA: Diagnosis not present

## 2015-03-02 DIAGNOSIS — M549 Dorsalgia, unspecified: Secondary | ICD-10-CM | POA: Diagnosis not present

## 2015-03-02 DIAGNOSIS — Z720 Tobacco use: Secondary | ICD-10-CM | POA: Diagnosis not present

## 2015-03-02 DIAGNOSIS — M5431 Sciatica, right side: Secondary | ICD-10-CM

## 2015-03-02 DIAGNOSIS — M25551 Pain in right hip: Secondary | ICD-10-CM | POA: Diagnosis present

## 2015-03-02 DIAGNOSIS — I10 Essential (primary) hypertension: Secondary | ICD-10-CM | POA: Insufficient documentation

## 2015-03-02 MED ORDER — PREDNISONE 10 MG PO TABS: ORAL_TABLET | ORAL | Status: DC

## 2015-03-02 NOTE — Discharge Instructions (Signed)
Sciatica Sciatica is pain, weakness, numbness, or tingling along the path of the sciatic nerve. The nerve starts in the lower back and runs down the back of each leg. The nerve controls the muscles in the lower leg and in the back of the knee, while also providing sensation to the back of the thigh, lower leg, and the sole of your foot. Sciatica is a symptom of another medical condition. For instance, nerve damage or certain conditions, such as a herniated disk or bone spur on the spine, pinch or put pressure on the sciatic nerve. This causes the pain, weakness, or other sensations normally associated with sciatica. Generally, sciatica only affects one side of the body. CAUSES   Herniated or slipped disc.  Degenerative disk disease.  A pain disorder involving the narrow muscle in the buttocks (piriformis syndrome).  Pelvic injury or fracture.  Pregnancy.  Tumor (rare). SYMPTOMS  Symptoms can vary from mild to very severe. The symptoms usually travel from the low back to the buttocks and down the back of the leg. Symptoms can include:  Mild tingling or dull aches in the lower back, leg, or hip.  Numbness in the back of the calf or sole of the foot.  Burning sensations in the lower back, leg, or hip.  Sharp pains in the lower back, leg, or hip.  Leg weakness.  Severe back pain inhibiting movement. These symptoms may get worse with coughing, sneezing, laughing, or prolonged sitting or standing. Also, being overweight may worsen symptoms. DIAGNOSIS  Your caregiver will perform a physical exam to look for common symptoms of sciatica. He or she may ask you to do certain movements or activities that would trigger sciatic nerve pain. Other tests may be performed to find the cause of the sciatica. These may include:  Blood tests.  X-rays.  Imaging tests, such as an MRI or CT scan. TREATMENT  Treatment is directed at the cause of the sciatic pain. Sometimes, treatment is not necessary  and the pain and discomfort goes away on its own. If treatment is needed, your caregiver may suggest:  Over-the-counter medicines to relieve pain.  Prescription medicines, such as anti-inflammatory medicine, muscle relaxants, or narcotics.  Applying heat or ice to the painful area.  Steroid injections to lessen pain, irritation, and inflammation around the nerve.  Reducing activity during periods of pain.  Exercising and stretching to strengthen your abdomen and improve flexibility of your spine. Your caregiver may suggest losing weight if the extra weight makes the back pain worse.  Physical therapy.  Surgery to eliminate what is pressing or pinching the nerve, such as a bone spur or part of a herniated disk. HOME CARE INSTRUCTIONS   Only take over-the-counter or prescription medicines for pain or discomfort as directed by your caregiver.  Apply ice to the affected area for 20 minutes, 3-4 times a day for the first 48-72 hours. Then try heat in the same way.  Exercise, stretch, or perform your usual activities if these do not aggravate your pain.  Attend physical therapy sessions as directed by your caregiver.  Keep all follow-up appointments as directed by your caregiver.  Do not wear high heels or shoes that do not provide proper support.  Check your mattress to see if it is too soft. A firm mattress may lessen your pain and discomfort. SEEK IMMEDIATE MEDICAL CARE IF:   You lose control of your bowel or bladder (incontinence).  You have increasing weakness in the lower back, pelvis, buttocks,   or legs.  You have redness or swelling of your back.  You have a burning sensation when you urinate.  You have pain that gets worse when you lie down or awakens you at night.  Your pain is worse than you have experienced in the past.  Your pain is lasting longer than 4 weeks.  You are suddenly losing weight without reason. MAKE SURE YOU:  Understand these  instructions.  Will watch your condition.  Will get help right away if you are not doing well or get worse. Document Released: 05/21/2001 Document Revised: 11/26/2011 Document Reviewed: 10/06/2011 [employer] Patient Information 2015 [employer]. This information is not intended to replace advice given to you by your health care provider. Make sure you discuss any questions you have with your health care provider.  

## 2015-03-02 NOTE — ED Notes (Signed)
Pt reports right hip pain, no radiation. Denies urinary symptoms nor injury.

## 2015-03-02 NOTE — ED Provider Notes (Signed)
CSN: [id number]     Arrival date & time 03/02/15  1429 History  This chart was scribed for non-physician practitioner, Teressa Lower, NP working with Arby Barrette, MD by Freida Busman, ED Scribe. This patient was seen in room WTR8/WTR8 and the patient's care was started at 2:42 PM.    Chief Complaint  Patient presents with  . right hip pain     The history is provided by the patient. No language interpreter was used.     HPI Comments:  Sharon Dyer is a 49 y.o. female who presents to the Emergency Department complaining of persistent right hip and right lower back pain for ~1 week. She was evaluated in the ED on 9/20 for the same, states she was discharged with flexeril which she has been taking with minimal relief. Pt notes her pain is exacerbated when bending over. She denies recent fall/ injury but notes an increase in extraneous activity at work. Past Medical History  Diagnosis Date  . Hypertension   . Obesity   . Back pain    Past Surgical History  Procedure Laterality Date  . Ectopic pregnancy surgery      2   No family history on file. Social History  Substance Use Topics  . Smoking status: Current Every Day Smoker -- 1.00 packs/day    Types: Cigarettes  . Smokeless tobacco: Never Used  . Alcohol Use: Yes     Comment: twice a year   OB History    No data available     Review of Systems  Constitutional: Negative for fever and chills.  Musculoskeletal: Positive for myalgias, back pain and arthralgias.       Right hip pain  All other systems reviewed and are negative.     Allergies  Review of patient's allergies indicates no known allergies.  Home Medications   Prior to Admission medications   Medication Sig Start Date End Date Taking? Authorizing Shalon Salado  cyclobenzaprine (FLEXERIL) 10 MG tablet Take 1 tablet (10 mg total) by mouth 3 (three) times daily as needed for muscle spasms. 02/28/15   Mercedes Camprubi-Soms, PA-C  ibuprofen (ADVIL,MOTRIN) 200  MG tablet Take 800 mg by mouth every 6 (six) hours as needed for mild pain.    Historical Coleman Kalas, MD  naproxen (NAPROSYN) 500 MG tablet Take 1 tablet (500 mg total) by mouth 2 (two) times daily with a meal. Patient not taking: Reported on 01/13/2015 06/05/14   Everlene Farrier, PA-C  prednisoLONE acetate (PRED FORTE) 1 % ophthalmic suspension Place 1 drop into both eyes 4 (four) times daily. Patient not taking: Reported on 06/05/2014 10/08/13   Joni Reining Pisciotta, PA-C   BP 119/50 mmHg  Pulse 77  Temp(Src) 97.9 F (36.6 C) (Oral)  Resp 16  SpO2 96%  LMP 01/12/2015 Physical Exam  Constitutional: She is oriented to person, place, and time. She appears well-developed and well-nourished. No distress.  HENT:  Head: Normocephalic and atraumatic.  Eyes: Conjunctivae are normal.  Neck: Neck supple.  Cardiovascular: Normal rate.   Pulmonary/Chest: Effort normal and breath sounds normal.  Abdominal: She exhibits no distension.  Musculoskeletal: Normal range of motion.  Right sciatic notch tenderness. Bilateral leg raises. Good sensation and strength bilaterally  Neurological: She is alert and oriented to person, place, and time. She exhibits normal muscle tone. Coordination normal.  Skin: Skin is warm and dry.  Psychiatric: She has a normal mood and affect.  Nursing note and vitals reviewed.   ED Course  Procedures  DIAGNOSTIC STUDIES:  Oxygen Saturation is 96% on RA, normal by my interpretation.    COORDINATION OF CARE:  2:46 PM Discussed treatment plan with pt at bedside and pt agreed to plan.  Labs Review Labs Reviewed - No data to display  Imaging Review No results found. I have personally reviewed and evaluated these images and lab results as part of my medical decision-making.   EKG Interpretation None      MDM   Final diagnoses:  Sciatica, right    Given flexeril 2 days ago. Pt given prednisone for symptomatic relief. Discussed follow up with ortho. Neurovascularly  intact  I personally performed the services described in this documentation, which was scribed in my presence. The recorded information has been reviewed and is accurate.    Teressa Lower, NP 03/02/15 1457  Arby Barrette, MD 03/07/15 [phone]

## 2015-03-24 ENCOUNTER — Encounter (HOSPITAL_COMMUNITY): Payer: Self-pay

## 2015-03-24 ENCOUNTER — Emergency Department (HOSPITAL_COMMUNITY)
Admission: EM | Admit: 2015-03-24 | Discharge: 2015-03-24 | Disposition: A | Discharge status: Home / Self Care | Payer: BLUE CROSS/BLUE SHIELD | Attending: Emergency Medicine | Admitting: Emergency Medicine

## 2015-03-24 DIAGNOSIS — M25551 Pain in right hip: Secondary | ICD-10-CM | POA: Diagnosis not present

## 2015-03-24 DIAGNOSIS — Z720 Tobacco use: Secondary | ICD-10-CM | POA: Insufficient documentation

## 2015-03-24 DIAGNOSIS — M5441 Lumbago with sciatica, right side: Secondary | ICD-10-CM | POA: Diagnosis not present

## 2015-03-24 DIAGNOSIS — I10 Essential (primary) hypertension: Secondary | ICD-10-CM | POA: Insufficient documentation

## 2015-03-24 DIAGNOSIS — M545 Low back pain: Secondary | ICD-10-CM | POA: Diagnosis present

## 2015-03-24 DIAGNOSIS — M79604 Pain in right leg: Secondary | ICD-10-CM | POA: Diagnosis not present

## 2015-03-24 DIAGNOSIS — E669 Obesity, unspecified: Secondary | ICD-10-CM | POA: Diagnosis not present

## 2015-03-24 MED ORDER — CYCLOBENZAPRINE HCL 10 MG PO TABS: 10.0000 mg | ORAL_TABLET | Freq: Two times a day (BID) | ORAL | Status: DC | PRN

## 2015-03-24 MED ORDER — MELOXICAM 7.5 MG PO TABS: 7.5000 mg | ORAL_TABLET | Freq: Every day | ORAL | Status: DC

## 2015-03-24 NOTE — Discharge Instructions (Signed)
- schedule orthopedic appointment with Dr. Magnus Ivan - Use meloxicam once a day for pain  - Use flexeril (muscle relaxer) - Use ice and heat for pain  Sciatica Sciatica is pain, weakness, numbness, or tingling along the path of the sciatic nerve. The nerve starts in the lower back and runs down the back of each leg. The nerve controls the muscles in the lower leg and in the back of the knee, while also providing sensation to the back of the thigh, lower leg, and the sole of your foot. Sciatica is a symptom of another medical condition. For instance, nerve damage or certain conditions, such as a herniated disk or bone spur on the spine, pinch or put pressure on the sciatic nerve. This causes the pain, weakness, or other sensations normally associated with sciatica. Generally, sciatica only affects one side of the body. CAUSES  1. Herniated or slipped disc. 2. Degenerative disk disease. 3. A pain disorder involving the narrow muscle in the buttocks (piriformis syndrome). 4. Pelvic injury or fracture. 5. Pregnancy. 6. Tumor (rare). SYMPTOMS  Symptoms can vary from mild to very severe. The symptoms usually travel from the low back to the buttocks and down the back of the leg. Symptoms can include: 1. Mild tingling or dull aches in the lower back, leg, or hip. 2. Numbness in the back of the calf or sole of the foot. 3. Burning sensations in the lower back, leg, or hip. 4. Sharp pains in the lower back, leg, or hip. 5. Leg weakness. 6. Severe back pain inhibiting movement. These symptoms may get worse with coughing, sneezing, laughing, or prolonged sitting or standing. Also, being overweight may worsen symptoms. DIAGNOSIS  Your caregiver will perform a physical exam to look for common symptoms of sciatica. He or she may ask you to do certain movements or activities that would trigger sciatic nerve pain. Other tests may be performed to find the cause of the sciatica. These may include: 1. Blood  tests. 2. X-rays. 3. Imaging tests, such as an MRI or CT scan. TREATMENT  Treatment is directed at the cause of the sciatic pain. Sometimes, treatment is not necessary and the pain and discomfort goes away on its own. If treatment is needed, your caregiver may suggest: 1. Over-the-counter medicines to relieve pain. 2. Prescription medicines, such as anti-inflammatory medicine, muscle relaxants, or narcotics. 3. Applying heat or ice to the painful area. 4. Steroid injections to lessen pain, irritation, and inflammation around the nerve. 5. Reducing activity during periods of pain. 6. Exercising and stretching to strengthen your abdomen and improve flexibility of your spine. Your caregiver may suggest losing weight if the extra weight makes the back pain worse. 7. Physical therapy. 8. Surgery to eliminate what is pressing or pinching the nerve, such as a bone spur or part of a herniated disk. HOME CARE INSTRUCTIONS  1. Only take over-the-counter or prescription medicines for pain or discomfort as directed by your caregiver. 2. Apply ice to the affected area for 20 minutes, 3-4 times a day for the first 48-72 hours. Then try heat in the same way. 3. Exercise, stretch, or perform your usual activities if these do not aggravate your pain. 4. Attend physical therapy sessions as directed by your caregiver. 5. Keep all follow-up appointments as directed by your caregiver. 6. Do not wear high heels or shoes that do not provide proper support. 7. Check your mattress to see if it is too soft. A firm mattress may lessen your pain  and discomfort. SEEK IMMEDIATE MEDICAL CARE IF:  1. You lose control of your bowel or bladder (incontinence). 2. You have increasing weakness in the lower back, pelvis, buttocks, or legs. 3. You have redness or swelling of your back. 4. You have a burning sensation when you urinate. 5. You have pain that gets worse when you lie down or awakens you at night. 6. Your pain is  worse than you have experienced in the past. 7. Your pain is lasting longer than 4 weeks. 8. You are suddenly losing weight without reason. MAKE SURE YOU: 1. Understand these instructions. 2. Will watch your condition. 3. Will get help right away if you are not doing well or get worse.   This information is not intended to replace advice given to you by your health care provider. Make sure you discuss any questions you have with your health care provider.   Document Released: 05/21/2001 Document Revised: 02/15/2015 Document Reviewed: 10/06/2011 Elsevier Interactive Patient Education 2016 [employer]  Musculoskeletal Pain Musculoskeletal pain is muscle and boney aches and pains. These pains can occur in any part of the body. Your caregiver may treat you without knowing the cause of the pain. They may treat you if blood or urine tests, X-rays, and other tests were normal.  CAUSES There is often not a definite cause or reason for these pains. These pains may be caused by a type of germ (virus). The discomfort may also come from overuse. Overuse includes working out too hard when your body is not fit. Boney aches also come from weather changes. Bone is sensitive to atmospheric pressure changes. HOME CARE INSTRUCTIONS  7. Ask when your test results will be ready. Make sure you get your test results. 8. Only take over-the-counter or prescription medicines for pain, discomfort, or fever as directed by your caregiver. If you were given medications for your condition, do not drive, operate machinery or power tools, or sign legal documents for 24 hours. Do not drink alcohol. Do not take sleeping pills or other medications that may interfere with treatment. 9. Continue all activities unless the activities cause more pain. When the pain lessens, slowly resume normal activities. Gradually increase the intensity and duration of the activities or exercise. 10. During periods of severe pain, bed rest may be  helpful. Lay or sit in any position that is comfortable. 11. Putting ice on the injured area. 1. Put ice in a bag. 2. Place a towel between your skin and the bag. 3. Leave the ice on for 15 to 20 minutes, 3 to 4 times a day. 12. Follow up with your caregiver for continued problems and no reason can be found for the pain. If the pain becomes worse or does not go away, it may be necessary to repeat tests or do additional testing. Your caregiver may need to look further for a possible cause. SEEK IMMEDIATE MEDICAL CARE IF: 7. You have pain that is getting worse and is not relieved by medications. 8. You develop chest pain that is associated with shortness or breath, sweating, feeling sick to your stomach (nauseous), or throw up (vomit). 9. Your pain becomes localized to the abdomen. 10. You develop any new symptoms that seem different or that concern you. MAKE SURE YOU:  4. Understand these instructions. 5. Will watch your condition. 6. Will get help right away if you are not doing well or get worse.   This information is not intended to replace advice given to you by your  health care provider. Make sure you discuss any questions you have with your health care provider.   Document Released: 05/27/2005 Document Revised: 08/19/2011 Document Reviewed: 01/29/2013 Elsevier Interactive Patient Education 2016 [employer]  [employer] Therapy Heat therapy can help ease sore, stiff, injured, and tight muscles and joints. Heat relaxes your muscles, which may help ease your pain.  RISKS AND COMPLICATIONS If you have any of the following conditions, do not use heat therapy unless your health care provider has approved: 13. Poor circulation. 14. Healing wounds or scarred skin in the area being treated. 15. Diabetes, heart disease, or high blood pressure. 16. Not being able to feel (numbness) the area being treated. 17. Unusual swelling of the area being treated. 18. Active infections. 19. Blood  clots. 20. Cancer. 21. Inability to communicate pain. This may include young children and people who have problems with their brain function (dementia). 22. Pregnancy. Heat therapy should only be used on old, pre-existing, or long-lasting (chronic) injuries. Do not use heat therapy on new injuries unless directed by your health care provider. HOW TO USE HEAT THERAPY There are several different kinds of heat therapy, including: 11. Moist heat pack. 12. Warm water bath. 13. Hot water bottle. 14. Electric heating pad. 15. Heated gel pack. 16. Heated wrap. 17. Electric heating pad. Use the heat therapy method suggested by your health care provider. Follow your health care provider's instructions on when and how to use heat therapy. GENERAL HEAT THERAPY RECOMMENDATIONS 7. Do not sleep while using heat therapy. Only use heat therapy while you are awake. 8. Your skin may turn pink while using heat therapy. Do not use heat therapy if your skin turns red. 9. Do not use heat therapy if you have new pain. 10. High heat or long exposure to heat can cause burns. Be careful when using heat therapy to avoid burning your skin. 11. Do not use heat therapy on areas of your skin that are already irritated, such as with a rash or sunburn. SEEK MEDICAL CARE IF: 9. You have blisters, redness, swelling, or numbness. 10. You have new pain. 11. Your pain is worse. MAKE SURE YOU: 8. Understand these instructions. 9. Will watch your condition. 10. Will get help right away if you are not doing well or get worse.   This information is not intended to replace advice given to you by your health care provider. Make sure you discuss any questions you have with your health care provider.   Document Released: 08/19/2011 Document Revised: 06/17/2014 Document Reviewed: 07/20/2013 Elsevier Interactive Patient Education 2016 [employer]  Cryotherapy Cryotherapy means treatment with cold. Ice or gel packs can be used  to reduce both pain and swelling. Ice is the most helpful within the first 24 to 48 hours after an injury or flare-up from overusing a muscle or joint. Sprains, strains, spasms, burning pain, shooting pain, and aches can all be eased with ice. Ice can also be used when recovering from surgery. Ice is effective, has very few side effects, and is safe for most people to use. PRECAUTIONS  Ice is not a safe treatment option for people with: 23. Raynaud phenomenon. This is a condition affecting small blood vessels in the extremities. Exposure to cold may cause your problems to return. 24. Cold hypersensitivity. There are many forms of cold hypersensitivity, including: 1. Cold urticaria. Red, itchy hives appear on the skin when the tissues begin to warm after being iced. 2. Cold erythema. This is a red, itchy  rash caused by exposure to cold. 3. Cold hemoglobinuria. Red blood cells break down when the tissues begin to warm after being iced. The hemoglobin that carry oxygen are passed into the urine because they cannot combine with blood proteins fast enough. 25. Numbness or altered sensitivity in the area being iced. If you have any of the following conditions, do not use ice until you have discussed cryotherapy with your caregiver: 18. Heart conditions, such as arrhythmia, angina, or chronic heart disease. 19. High blood pressure. 20. Healing wounds or open skin in the area being iced. 21. Current infections. 22. Rheumatoid arthritis. 23. Poor circulation. 24. Diabetes. Ice slows the blood flow in the region it is applied. This is beneficial when trying to stop inflamed tissues from spreading irritating chemicals to surrounding tissues. However, if you expose your skin to cold temperatures for too long or without the proper protection, you can damage your skin or nerves. Watch for signs of skin damage due to cold. HOME CARE INSTRUCTIONS Follow these tips to use ice and cold packs safely. 12. Place a  dry or damp towel between the ice and skin. A damp towel will cool the skin more quickly, so you may need to shorten the time that the ice is used. 13. For a more rapid response, add gentle compression to the ice. 14. Ice for no more than 10 to 20 minutes at a time. The bonier the area you are icing, the less time it will take to get the benefits of ice. 15. Check your skin after 5 minutes to make sure there are no signs of a poor response to cold or skin damage. 16. Rest 20 minutes or more between uses. 17. Once your skin is numb, you can end your treatment. You can test numbness by very lightly touching your skin. The touch should be so light that you do not see the skin dimple from the pressure of your fingertip. When using ice, most people will feel these normal sensations in this order: cold, burning, aching, and numbness. 18. Do not use ice on someone who cannot communicate their responses to pain, such as small children or people with dementia. HOW TO MAKE AN ICE PACK Ice packs are the most common way to use ice therapy. Other methods include ice massage, ice baths, and cryosprays. Muscle creams that cause a cold, tingly feeling do not offer the same benefits that ice offers and should not be used as a substitute unless recommended by your caregiver. To make an ice pack, do one of the following: 12. Place crushed ice or a bag of frozen vegetables in a sealable plastic bag. Squeeze out the excess air. Place this bag inside another plastic bag. Slide the bag into a pillowcase or place a damp towel between your skin and the bag. 13. Mix 3 parts water with 1 part rubbing alcohol. Freeze the mixture in a sealable plastic bag. When you remove the mixture from the freezer, it will be slushy. Squeeze out the excess air. Place this bag inside another plastic bag. Slide the bag into a pillowcase or place a damp towel between your skin and the bag. SEEK MEDICAL CARE IF: 11. You develop white spots on your  skin. This may give the skin a blotchy (mottled) appearance. 12. Your skin turns blue or pale. 13. Your skin becomes waxy or hard. 14. Your swelling gets worse. MAKE SURE YOU:  9. Understand these instructions. 10. Will watch your condition. 11. Will get help  right away if you are not doing well or get worse.   This information is not intended to replace advice given to you by your health care provider. Make sure you discuss any questions you have with your health care provider.   Document Released: 01/21/2011 Document Revised: 06/17/2014 Document Reviewed: 01/21/2011 Elsevier Interactive Patient Education 2016 [employer]  Back Exercises The following exercises strengthen the muscles that help to support the back. They also help to keep the lower back flexible. Doing these exercises can help to prevent back pain or lessen existing pain. If you have back pain or discomfort, try doing these exercises 2-3 times each day or as told by your health care provider. When the pain goes away, do them once each day, but increase the number of times that you repeat the steps for each exercise (do more repetitions). If you do not have back pain or discomfort, do these exercises once each day or as told by your health care provider. EXERCISES Single Knee to Chest Repeat these steps 3-5 times for each leg: 26. Lie on your back on a firm bed or the floor with your legs extended. 27. Bring one knee to your chest. Your other leg should stay extended and in contact with the floor. 28. Hold your knee in place by grabbing your knee or thigh. 29. Pull on your knee until you feel a gentle stretch in your lower back. 30. Hold the stretch for 10-30 seconds. 31. Slowly release and straighten your leg. Pelvic Tilt Repeat these steps 5-10 times: 25. Lie on your back on a firm bed or the floor with your legs extended. 26. Bend your knees so they are pointing toward the ceiling and your feet are flat on the  floor. 27. Tighten your lower abdominal muscles to press your lower back against the floor. This motion will tilt your pelvis so your tailbone points up toward the ceiling instead of pointing to your feet or the floor. 28. With gentle tension and even breathing, hold this position for 5-10 seconds. Cat-Cow Repeat these steps until your lower back becomes more flexible: 19. Get into a hands-and-knees position on a firm surface. Keep your hands under your shoulders, and keep your knees under your hips. You may place padding under your knees for comfort. 20. Let your head hang down, and point your tailbone toward the floor so your lower back becomes rounded like the back of a cat. 21. Hold this position for 5 seconds. 22. Slowly lift your head and point your tailbone up toward the ceiling so your back forms a sagging arch like the back of a cow. 23. Hold this position for 5 seconds. Press-Ups Repeat these steps 5-10 times: 14. Lie on your abdomen (face-down) on the floor. 15. Place your palms near your head, about shoulder-width apart. 16. While you keep your back as relaxed as possible and keep your hips on the floor, slowly straighten your arms to raise the top half of your body and lift your shoulders. Do not use your back muscles to raise your upper torso. You may adjust the placement of your hands to make yourself more comfortable. 17. Hold this position for 5 seconds while you keep your back relaxed. 18. Slowly return to lying flat on the floor. Bridges Repeat these steps 10 times: 15. Lie on your back on a firm surface. 16. Bend your knees so they are pointing toward the ceiling and your feet are flat on the floor. 17. Tighten  your buttocks muscles and lift your buttocks off of the floor until your waist is at almost the same height as your knees. You should feel the muscles working in your buttocks and the back of your thighs. If you do not feel these muscles, slide your feet 1-2 inches  farther away from your buttocks. 18. Hold this position for 3-5 seconds. 19. Slowly lower your hips to the starting position, and allow your buttocks muscles to relax completely. If this exercise is too easy, try doing it with your arms crossed over your chest. Abdominal Crunches Repeat these steps 5-10 times: 12. Lie on your back on a firm bed or the floor with your legs extended. 13. Bend your knees so they are pointing toward the ceiling and your feet are flat on the floor. 14. Cross your arms over your chest. 15. Tip your chin slightly toward your chest without bending your neck. 16. Tighten your abdominal muscles and slowly raise your trunk (torso) high enough to lift your shoulder blades a tiny bit off of the floor. Avoid raising your torso higher than that, because it can put too much stress on your low back and it does not help to strengthen your abdominal muscles. 17. Slowly return to your starting position. Back Lifts Repeat these steps 5-10 times: 4. Lie on your abdomen (face-down) with your arms at your sides, and rest your forehead on the floor. 5. Tighten the muscles in your legs and your buttocks. 6. Slowly lift your chest off of the floor while you keep your hips pressed to the floor. Keep the back of your head in line with the curve in your back. Your eyes should be looking at the floor. 7. Hold this position for 3-5 seconds. 8. Slowly return to your starting position. SEEK MEDICAL CARE IF:  Your back pain or discomfort gets much worse when you do an exercise.  Your back pain or discomfort does not lessen within 2 hours after you exercise. If you have any of these problems, stop doing these exercises right away. Do not do them again unless your health care provider says that you can. SEEK IMMEDIATE MEDICAL CARE IF:  You develop sudden, severe back pain. If this happens, stop doing the exercises right away. Do not do them again unless your health care provider says that you  can.   This information is not intended to replace advice given to you by your health care provider. Make sure you discuss any questions you have with your health care provider.   Document Released: 07/04/2004 Document Revised: 02/15/2015 Document Reviewed: 07/21/2014 Else[employer] Interactive Patient Education [employer]

## 2015-03-24 NOTE — ED Provider Notes (Signed)
CSN: [id number]     Arrival date & time 03/24/15  1532 History  By signing my name below, I, [name], attest that this documentation has been prepared under the direction and in the presence of Jguadalupe Opiela, PA-C. Electronically Signed: [name], ED Scribe. 03/24/2015. 4:19 PM.  Chief Complaint  Patient presents with  . Back Pain  . Leg Pain   The history is provided by the patient. No language interpreter was used.     HPI Comments: Sharon SchlatterRenee A Dyer is a 49 y.o. female who presents to the Emergency Department complaining of gradual onset, intermittent, sharp and shooting, right lower back pain radiating down to right hip x 1.5 months. The pain is exacerbated with bending and moving a certain way. Is it mildly alleviated with rest. She denies any injury, trauma, or fall that could have caused the pain. She does admit to working more than usual due to being short staffed at her job. She also complains of mild numbness to her right toes. Pt was seen by PCP and twice in the ED for same symptoms. She has been prescribed Flexeril, Prednisone, and Diclofenac for these symptoms. She notes that she is prescribed 1-2 pills of Diclofenac per day but has been taking 3 at one time because taking 1-2 at a time does not help. Pt has not followed up with an orthopedist yet. Denies weakness, tingling, fever, chills, hot sweats, nausea, vomiting, urinary or bowel incontinence, or any other associated symptoms. Pt reports history of hypertension but does not take her blood pressure medicines regularly.   Past Medical History  Diagnosis Date  . Hypertension   . Obesity   . Back pain    Past Surgical History  Procedure Laterality Date  . Ectopic pregnancy surgery      2   History reviewed. No pertinent family history. Social History  Substance Use Topics  . Smoking status: Current Every Day Smoker -- 1.00 packs/day    Types: Cigarettes  . Smokeless tobacco: Never Used  . Alcohol Use: Yes      Comment: twice a year   OB History    No data available     Review of Systems  Constitutional: Negative for fever, chills and diaphoresis.  Gastrointestinal: Negative for nausea and vomiting.       Negative for bowel incontinence  Genitourinary: Negative for difficulty urinating.       Negative for urinary incontinence  Musculoskeletal: Positive for back pain and arthralgias (Right hip). Negative for joint swelling and gait problem.  Skin: Negative for rash and wound.  Neurological: Positive for numbness (Right toes). Negative for weakness.   Allergies  Review of patient's allergies indicates no known allergies.  Home Medications   Prior to Admission medications   Medication Sig Start Date End Date Taking? Authorizing Provider  cyclobenzaprine (FLEXERIL) 10 MG tablet Take 1 tablet (10 mg total) by mouth 2 (two) times daily as needed for muscle spasms. 03/24/15   Mattalyn Anderegg, PA-C  ibuprofen (ADVIL,MOTRIN) 200 MG tablet Take 800 mg by mouth every 6 (six) hours as needed for mild pain.    Historical Provider, MD  meloxicam (MOBIC) 7.5 MG tablet Take 1 tablet (7.5 mg total) by mouth daily. 03/24/15   Danne Vasek, PA-C  naproxen (NAPROSYN) 500 MG tablet Take 1 tablet (500 mg total) by mouth 2 (two) times daily with a meal. Patient not taking: Reported on 01/13/2015 06/05/14   Everlene FarrierWilliam Dansie, PA-C  prednisoLONE acetate (PRED FORTE) 1 %  ophthalmic suspension Place 1 drop into both eyes 4 (four) times daily. Patient not taking: Reported on 06/05/2014 10/08/13   Joni Reining Pisciotta, PA-C  predniSONE (DELTASONE) 10 MG tablet 6 day taper 03/02/15   Teressa Lower, NP   Triage Vitals: BP 185/116 mmHg  Pulse 75  Temp(Src) 97.9 F (36.6 C) (Oral)  Resp 18  SpO2 100%  LMP 01/12/2015   Physical Exam  Constitutional: She appears well-developed and well-nourished. No distress.  HENT:  Head: Normocephalic and atraumatic.  Right Ear: External ear normal.  Left Ear: External ear normal.   Eyes: Conjunctivae are normal. Right eye exhibits no discharge. Left eye exhibits no discharge. No scleral icterus.  Neck: Normal range of motion.  Cardiovascular: Normal rate.   Pedal pulses palpable BP 184/79 during exam  Pulmonary/Chest: Effort normal.  Musculoskeletal: Normal range of motion.  No obvious deformities. No tenderness to palpation over lumbar region or right hip. States that my fingers are over the pain but it is "too deep for what you're doing to hurt it". FROM of hips and knees b/l. FROM of lumbar spine. Positive straight leg raise on right. Pt walks with steady gait and favoring right leg  Neurological: She is alert. Coordination normal.  5/5 strength of hips and knees b/l. Sensation to light touch intact.   Skin: Skin is warm and dry.  Psychiatric: She has a normal mood and affect. Her behavior is normal.  Nursing note and vitals reviewed.   ED Course  Procedures (including critical care time)  DIAGNOSTIC STUDIES: Oxygen Saturation is 100% on RA, normal by my interpretation.    COORDINATION OF CARE: 4:18 PM-Discussed treatment plan which includes referral to orthopedist with pt at bedside and pt agreed to plan.   Labs Review Labs Reviewed - No data to display  Imaging Review No results found.   EKG Interpretation None      MDM   Final diagnoses:  Right-sided low back pain with right-sided sciatica  Hip pain, right   Patient with back pain complicated by sciatica.  Non-focal neuro exam.  Patient can walk but states is painful.  No loss of bowel or bladder control.  No concern for cauda equina.  No fever, night sweats, weight loss, h/o cancer, IVDU. Pt has been assessed for this by PCP and ED twice. Attempted pain control with diclofenac, flexeril and prednisone. Pt has not followed up with orthopedics yet. Will give meloxicam and flexeril. Referral to Dr. Magnus Ivan given in d/c paperwork. Encouraged to establish care with PCP for blood pressure control.  Return precautions given in discharge paperwork and discussed with pt at bedside. Pt stable for discharge  I personally performed the services described in this documentation, which was scribed in my presence. The recorded information has been reviewed and is accurate.     Alveta Heimlich, PA-C 03/24/15 1723  Lorre Nick, MD 03/26/15 [phone]

## 2015-03-24 NOTE — ED Notes (Signed)
Pt c/o R lower back pain radiating into R leg x 1 month.  Pain score 8/10, increasing w/ movement.   Pt reports taking diclofenac w/o relief.  Pt reports being seen by PCP and ED recently for same.

## 2015-03-26 ENCOUNTER — Emergency Department (HOSPITAL_COMMUNITY): Payer: BLUE CROSS/BLUE SHIELD

## 2015-03-26 ENCOUNTER — Encounter (HOSPITAL_COMMUNITY): Payer: Self-pay | Admitting: Emergency Medicine

## 2015-03-26 ENCOUNTER — Emergency Department (HOSPITAL_COMMUNITY)
Admission: EM | Admit: 2015-03-26 | Discharge: 2015-03-27 | Disposition: A | Discharge status: Home / Self Care | Payer: BLUE CROSS/BLUE SHIELD | Attending: Emergency Medicine | Admitting: Emergency Medicine

## 2015-03-26 DIAGNOSIS — Z791 Long term (current) use of non-steroidal anti-inflammatories (NSAID): Secondary | ICD-10-CM | POA: Diagnosis not present

## 2015-03-26 DIAGNOSIS — I10 Essential (primary) hypertension: Secondary | ICD-10-CM | POA: Insufficient documentation

## 2015-03-26 DIAGNOSIS — Z720 Tobacco use: Secondary | ICD-10-CM | POA: Insufficient documentation

## 2015-03-26 DIAGNOSIS — Z3202 Encounter for pregnancy test, result negative: Secondary | ICD-10-CM | POA: Diagnosis not present

## 2015-03-26 DIAGNOSIS — K5732 Diverticulitis of large intestine without perforation or abscess without bleeding: Secondary | ICD-10-CM | POA: Diagnosis not present

## 2015-03-26 DIAGNOSIS — R1010 Upper abdominal pain, unspecified: Secondary | ICD-10-CM | POA: Diagnosis present

## 2015-03-26 DIAGNOSIS — Z79899 Other long term (current) drug therapy: Secondary | ICD-10-CM | POA: Insufficient documentation

## 2015-03-26 DIAGNOSIS — E669 Obesity, unspecified: Secondary | ICD-10-CM | POA: Insufficient documentation

## 2015-03-26 LAB — CBC
HCT: 42.7 % (ref 36.0–46.0)
Hemoglobin: 14.3 g/dL (ref 12.0–15.0)
MCH: 30 pg (ref 26.0–34.0)
MCHC: 33.5 g/dL (ref 30.0–36.0)
MCV: 89.7 fL (ref 78.0–100.0)
PLATELETS: 201 10*3/uL (ref 150–400)
RBC: 4.76 MIL/uL (ref 3.87–5.11)
RDW: 14.4 % (ref 11.5–15.5)
WBC: 16.8 10*3/uL — ABNORMAL HIGH (ref 4.0–10.5)

## 2015-03-26 LAB — COMPREHENSIVE METABOLIC PANEL
ALT: 17 U/L (ref 14–54)
ANION GAP: 10 (ref 5–15)
AST: 23 U/L (ref 15–41)
Albumin: 3.9 g/dL (ref 3.5–5.0)
Alkaline Phosphatase: 52 U/L (ref 38–126)
BUN: 20 mg/dL (ref 6–20)
CHLORIDE: 102 mmol/L (ref 101–111)
CO2: 27 mmol/L (ref 22–32)
Calcium: 9.1 mg/dL (ref 8.9–10.3)
Creatinine, Ser: 0.91 mg/dL (ref 0.44–1.00)
GFR calc non Af Amer: >60 mL/min (ref >60)
GLUCOSE: 76 mg/dL (ref 65–99)
POTASSIUM: 3.2 mmol/L — AB (ref 3.5–5.1)
SODIUM: 139 mmol/L (ref 135–145)
Total Bilirubin: 0.5 mg/dL (ref 0.3–1.2)
Total Protein: 7.4 g/dL (ref 6.5–8.1)

## 2015-03-26 LAB — I-STAT BETA HCG BLOOD, ED (MC, WL, AP ONLY): I-stat hCG, quantitative: <5 m[IU]/mL (ref <5)

## 2015-03-26 LAB — LIPASE, BLOOD: LIPASE: 29 U/L (ref 22–51)

## 2015-03-26 MED ORDER — SODIUM CHLORIDE 0.9 % IV BOLUS (SEPSIS)
500.0000 mL | Freq: Once | INTRAVENOUS | Status: AC
  Administered 2015-03-26: 500 mL via INTRAVENOUS

## 2015-03-26 MED ORDER — HYDROMORPHONE HCL 1 MG/ML IJ SOLN
0.5000 mg | Freq: Once | INTRAMUSCULAR | Status: AC
  Administered 2015-03-26: 0.5 mg via INTRAVENOUS
  Filled 2015-03-26: qty 1

## 2015-03-26 MED ORDER — ONDANSETRON HCL 4 MG/2ML IJ SOLN
4.0000 mg | Freq: Once | INTRAMUSCULAR | Status: AC
  Administered 2015-03-26: 4 mg via INTRAVENOUS
  Filled 2015-03-26: qty 2

## 2015-03-26 NOTE — ED Notes (Signed)
[facility] Expected date:  Expected time:  Means of arrival:  Comments: EMS abd pain - sudden onset

## 2015-03-26 NOTE — ED Notes (Addendum)
Pt presents from home via EMS c/o 8/10 sharp abdominal pain since around 5pm.  She took the first dose of a new prescription today, Divalproex sodium, and immediately began experiencing abdominal cramps.  She laid down for a nap and when she woke the pain was 10/10 and she was unable to stand so she called EMS.  Vitals were WNL en route and 12 lead EKG was unremarkable.  Pt also complains of dysuria this evening but was able to walk unassisted to the restroom on arrival and provided a urine sample per RN request.  Abdomen is very tender to palpation with hypoactive bowel sounds in all 4 quadrants.

## 2015-03-26 NOTE — ED Provider Notes (Signed)
CSN: [id number]     Arrival date & time 03/26/15  2054 History   First MD Initiated Contact with Patient 03/26/15 2132     Chief Complaint  Patient presents with  . Abdominal Pain     (Consider location/radiation/quality/duration/timing/severity/associated sxs/prior Treatment) Patient is a 49 y.o. female presenting with abdominal pain. The history is provided by the patient.  Abdominal Pain Associated symptoms: no chest pain, no chills, no cough, no diarrhea, no dysuria, no fever, no shortness of breath, no sore throat and no vomiting   Patient c/o bilateral mid to upper abd pain since this afternoon. Pain constant, dull, non radiating. No specific exacerbating or alleviating factors. Nausea, decreased appetite. No vomiting. Denies diarrhea or constipation. No dysuria or hematuria.  Pt denies vaginal discharge. Had started period 2 days ago. Denies pelvic pain. Denies hx same pain. Denies hx pud, pancreatitis, or gallstones. No hx diverticulitis. No fever or chills.     Past Medical History  Diagnosis Date  . Hypertension   . Obesity   . Back pain    Past Surgical History  Procedure Laterality Date  . Ectopic pregnancy surgery      2   History reviewed. No pertinent family history. Social History  Substance Use Topics  . Smoking status: Current Every Day Smoker -- 1.00 packs/day    Types: Cigarettes  . Smokeless tobacco: Never Used  . Alcohol Use: Yes     Comment: twice a year   OB History    No data available     Review of Systems  Constitutional: Negative for fever and chills.  HENT: Negative for sore throat.   Eyes: Negative for redness.  Respiratory: Negative for cough and shortness of breath.   Cardiovascular: Negative for chest pain.  Gastrointestinal: Positive for abdominal pain. Negative for vomiting and diarrhea.  Genitourinary: Negative for dysuria and flank pain.  Musculoskeletal: Negative for back pain and neck pain.  Skin: Negative for rash.   Neurological: Negative for headaches.  Hematological: Does not bruise/bleed easily.  Psychiatric/Behavioral: Negative for confusion.      Allergies  Review of patient's allergies indicates no known allergies.  Home Medications   Prior to Admission medications   Medication Sig Start Date End Date Taking? Authorizing Provider  cyclobenzaprine (FLEXERIL) 10 MG tablet Take 1 tablet (10 mg total) by mouth 2 (two) times daily as needed for muscle spasms. Patient taking differently: Take 10 mg by mouth 3 (three) times daily.  03/24/15  Yes Stevi Barrett, PA-C  ibuprofen (ADVIL,MOTRIN) 200 MG tablet Take 800 mg by mouth every 6 (six) hours as needed for mild pain.   Yes Historical Provider, MD  meloxicam (MOBIC) 7.5 MG tablet Take 1 tablet (7.5 mg total) by mouth daily. 03/24/15  Yes Stevi Barrett, PA-C  prednisoLONE acetate (PRED FORTE) 1 % ophthalmic suspension Place 1 drop into both eyes 4 (four) times daily. Patient not taking: Reported on 06/05/2014 10/08/13   Joni ReiningNicole Pisciotta, PA-C   BP 103/37 mmHg  Pulse 67  Temp(Src) 98.1 F (36.7 C) (Oral)  Resp 15  SpO2 100%  LMP 01/12/2015 Physical Exam  Constitutional: She appears well-developed and well-nourished. No distress.  HENT:  Mouth/Throat: Oropharynx is clear and moist.  Eyes: Conjunctivae are normal. No scleral icterus.  Neck: Neck supple. No tracheal deviation present.  Cardiovascular: Normal rate, regular rhythm, normal heart sounds and intact distal pulses.   Pulmonary/Chest: Effort normal and breath sounds normal. No respiratory distress.  Abdominal: Soft. Normal appearance and bowel  sounds are normal. She exhibits no distension and no mass. There is tenderness. There is no rebound and no guarding.  No incarc hernia. Obese.   Genitourinary:  No cva tenderness  Musculoskeletal: She exhibits no edema.  Neurological: She is alert.  Skin: Skin is warm and dry. No rash noted. She is not diaphoretic.  Psychiatric: She has a  normal mood and affect.  Nursing note and vitals reviewed.   ED Course  Procedures (including critical care time) Labs Review  Results for orders placed or performed during the hospital encounter of 03/26/15  CBC  Result Value Ref Range   WBC 16.8 (H) 4.0 - 10.5 K/uL   RBC 4.76 3.87 - 5.11 MIL/uL   Hemoglobin 14.3 12.0 - 15.0 g/dL   HCT 29.5 62.1 - 30.8 %   MCV 89.7 78.0 - 100.0 fL   MCH 30.0 26.0 - 34.0 pg   MCHC 33.5 30.0 - 36.0 g/dL   RDW 65.7 84.6 - 96.2 %   Platelets 201 150 - 400 K/uL  Comprehensive metabolic panel  Result Value Ref Range   Sodium 139 135 - 145 mmol/L   Potassium 3.2 (L) 3.5 - 5.1 mmol/L   Chloride 102 101 - 111 mmol/L   CO2 27 22 - 32 mmol/L   Glucose, Bld 76 65 - 99 mg/dL   BUN 20 6 - 20 mg/dL   Creatinine, Ser 9.52 0.44 - 1.00 mg/dL   Calcium 9.1 8.9 - 84.1 mg/dL   Total Protein 7.4 6.5 - 8.1 g/dL   Albumin 3.9 3.5 - 5.0 g/dL   AST 23 15 - 41 U/L   ALT 17 14 - 54 U/L   Alkaline Phosphatase 52 38 - 126 U/L   Total Bilirubin 0.5 0.3 - 1.2 mg/dL   GFR calc non Af Amer >60 >60 mL/min   GFR calc Af Amer >60 >60 mL/min   Anion gap 10 5 - 15  Lipase, blood  Result Value Ref Range   Lipase 29 22 - 51 U/L  I-Stat beta hCG blood, ED ([facility], [facility], [facility] only)  Result Value Ref Range   I-stat hCG, quantitative <5.0 <5 mIU/mL   Comment 3              I have personally reviewed and evaluated these images and lab results as part of my medical decision-making.    MDM   Iv ns. Dilaudid .5 mg iv. zofran iv.   Reviewed nursing notes and prior charts for additional history.   Labs.  preg neg.  Persistent mid to upper abd  Tenderness, wbc elevated, will get ct.  MN - ct pending/ua pending - signed out to Dr Nicanor Alcon to check CT, recheck pt, and dispo appropriately.       Cathren Laine, MD 03/26/15 [phone]

## 2015-03-27 ENCOUNTER — Encounter (HOSPITAL_COMMUNITY): Payer: Self-pay

## 2015-03-27 LAB — URINALYSIS, ROUTINE W REFLEX MICROSCOPIC
BILIRUBIN URINE: NEGATIVE
Glucose, UA: NEGATIVE mg/dL
Ketones, ur: NEGATIVE mg/dL
Nitrite: NEGATIVE
PH: 5.5 (ref 5.0–8.0)
Protein, ur: 30 mg/dL — AB
SPECIFIC GRAVITY, URINE: 1.019 (ref 1.005–1.030)
Urobilinogen, UA: 1 mg/dL (ref 0.0–1.0)

## 2015-03-27 LAB — URINE MICROSCOPIC-ADD ON

## 2015-03-27 MED ORDER — SODIUM CHLORIDE 0.9 % IV BOLUS (SEPSIS)
1000.0000 mL | Freq: Once | INTRAVENOUS | Status: AC
  Administered 2015-03-27: 1000 mL via INTRAVENOUS

## 2015-03-27 MED ORDER — IOHEXOL 300 MG/ML  SOLN
25.0000 mL | Freq: Once | INTRAMUSCULAR | Status: AC | PRN
Start: 2015-03-26 — End: 2015-03-27
  Administered 2015-03-27: 25 mL via ORAL

## 2015-03-27 MED ORDER — CIPROFLOXACIN HCL 500 MG PO TABS: 500.0000 mg | ORAL_TABLET | Freq: Two times a day (BID) | ORAL | Status: DC

## 2015-03-27 MED ORDER — IOHEXOL 300 MG/ML  SOLN
100.0000 mL | Freq: Once | INTRAMUSCULAR | Status: AC | PRN
  Administered 2015-03-27: 100 mL via INTRAVENOUS

## 2015-03-27 MED ORDER — TRAMADOL HCL 50 MG PO TABS: 50.0000 mg | ORAL_TABLET | Freq: Four times a day (QID) | ORAL | Status: DC | PRN

## 2015-03-27 MED ORDER — METRONIDAZOLE IN NACL 5-0.79 MG/ML-% IV SOLN
500.0000 mg | Freq: Once | INTRAVENOUS | Status: AC
  Administered 2015-03-27: 500 mg via INTRAVENOUS
  Filled 2015-03-27: qty 100

## 2015-03-27 MED ORDER — HYDROMORPHONE HCL 1 MG/ML IJ SOLN
0.5000 mg | Freq: Once | INTRAMUSCULAR | Status: AC
  Administered 2015-03-27: 0.5 mg via INTRAVENOUS
  Filled 2015-03-27: qty 1

## 2015-03-27 MED ORDER — METRONIDAZOLE 500 MG PO TABS: 500.0000 mg | ORAL_TABLET | Freq: Three times a day (TID) | ORAL | Status: DC

## 2015-03-27 MED ORDER — CIPROFLOXACIN IN D5W 400 MG/200ML IV SOLN
400.0000 mg | Freq: Once | INTRAVENOUS | Status: AC
  Administered 2015-03-27: 400 mg via INTRAVENOUS
  Filled 2015-03-27: qty 200

## 2015-03-27 NOTE — ED Notes (Signed)
Patient transported to CT 

## 2015-03-27 NOTE — Discharge Instructions (Signed)
Diverticulitis °Diverticulitis is inflammation or infection of small pouches in your colon that form when you have a condition called diverticulosis. The pouches in your colon are called diverticula. Your colon, or large intestine, is where water is absorbed and stool is formed. °Complications of diverticulitis can include: °· Bleeding. °· Severe infection. °· Severe pain. °· Perforation of your colon. °· Obstruction of your colon. °CAUSES  °Diverticulitis is caused by bacteria. °Diverticulitis happens when stool becomes trapped in diverticula. This allows bacteria to grow in the diverticula, which can lead to inflammation and infection. °RISK FACTORS °People with diverticulosis are at risk for diverticulitis. Eating a diet that does not include enough fiber from fruits and vegetables may make diverticulitis more likely to develop. °SYMPTOMS  °Symptoms of diverticulitis may include: °· Abdominal pain and tenderness. The pain is normally located on the left side of the abdomen, but may occur in other areas. °· Fever and chills. °· Bloating. °· Cramping. °· Nausea. °· Vomiting. °· Constipation. °· Diarrhea. °· Blood in your stool. °DIAGNOSIS  °Your health care provider will ask you about your medical history and do a physical exam. You may need to have tests done because many medical conditions can cause the same symptoms as diverticulitis. Tests may include: °· Blood tests. °· Urine tests. °· Imaging tests of the abdomen, including X-rays and CT scans. °When your condition is under control, your health care provider may recommend that you have a colonoscopy. A colonoscopy can show how severe your diverticula are and whether something else is causing your symptoms. °TREATMENT  °Most cases of diverticulitis are mild and can be treated at home. Treatment may include: °· Taking over-the-counter pain medicines. °· Following a clear liquid diet. °· Taking antibiotic medicines by mouth for 7-10 days. °More severe cases may  be treated at a hospital. Treatment may include: °· Not eating or drinking. °· Taking prescription pain medicine. °· Receiving antibiotic medicines through an IV tube. °· Receiving fluids and nutrition through an IV tube. °· Surgery. °HOME CARE INSTRUCTIONS  °· Follow your health care provider's instructions carefully. °· Follow a full liquid diet or other diet as directed by your health care provider. After your symptoms improve, your health care provider may tell you to change your diet. He or she may recommend you eat a high-fiber diet. Fruits and vegetables are good sources of fiber. Fiber makes it easier to pass stool. °· Take fiber supplements or probiotics as directed by your health care provider. °· Only take medicines as directed by your health care provider. °· Keep all your follow-up appointments. °SEEK MEDICAL CARE IF:  °· Your pain does not improve. °· You have a hard time eating food. °· Your bowel movements do not return to normal. °SEEK IMMEDIATE MEDICAL CARE IF:  °· Your pain becomes worse. °· Your symptoms do not get better. °· Your symptoms suddenly get worse. °· You have a fever. °· You have repeated vomiting. °· You have bloody or black, tarry stools. °MAKE SURE YOU:  °· Understand these instructions. °· Will watch your condition. °· Will get help right away if you are not doing well or get worse. °  °This information is not intended to replace advice given to you by your health care provider. Make sure you discuss any questions you have with your health care provider. °  °Document Released: 03/06/2005 Document Revised: 06/01/2013 Document Reviewed: 04/21/2013 °Elsevier Interactive Patient Education ©2016 [employer] ° °

## 2016-04-04 ENCOUNTER — Encounter (HOSPITAL_BASED_OUTPATIENT_CLINIC_OR_DEPARTMENT_OTHER): Payer: Self-pay | Admitting: *Deleted

## 2016-04-04 ENCOUNTER — Emergency Department (HOSPITAL_BASED_OUTPATIENT_CLINIC_OR_DEPARTMENT_OTHER)
Admission: EM | Admit: 2016-04-04 | Discharge: 2016-04-04 | Disposition: A | Discharge status: Home / Self Care | Payer: 59 | Attending: Emergency Medicine | Admitting: Emergency Medicine

## 2016-04-04 ENCOUNTER — Emergency Department (HOSPITAL_BASED_OUTPATIENT_CLINIC_OR_DEPARTMENT_OTHER): Payer: 59

## 2016-04-04 DIAGNOSIS — T148XXA Other injury of unspecified body region, initial encounter: Secondary | ICD-10-CM

## 2016-04-04 DIAGNOSIS — W34010A Accidental discharge of airgun, initial encounter: Secondary | ICD-10-CM

## 2016-04-04 DIAGNOSIS — Z79899 Other long term (current) drug therapy: Secondary | ICD-10-CM | POA: Insufficient documentation

## 2016-04-04 DIAGNOSIS — Y999 Unspecified external cause status: Secondary | ICD-10-CM | POA: Insufficient documentation

## 2016-04-04 DIAGNOSIS — Y939 Activity, unspecified: Secondary | ICD-10-CM | POA: Insufficient documentation

## 2016-04-04 DIAGNOSIS — I10 Essential (primary) hypertension: Secondary | ICD-10-CM | POA: Insufficient documentation

## 2016-04-04 DIAGNOSIS — W34110A Accidental malfunction of airgun, initial encounter: Secondary | ICD-10-CM | POA: Insufficient documentation

## 2016-04-04 DIAGNOSIS — Y929 Unspecified place or not applicable: Secondary | ICD-10-CM | POA: Insufficient documentation

## 2016-04-04 DIAGNOSIS — F17210 Nicotine dependence, cigarettes, uncomplicated: Secondary | ICD-10-CM | POA: Insufficient documentation

## 2016-04-04 DIAGNOSIS — S20311A Abrasion of right front wall of thorax, initial encounter: Secondary | ICD-10-CM | POA: Insufficient documentation

## 2016-04-04 MED ORDER — LISINOPRIL-HYDROCHLOROTHIAZIDE 10-12.5 MG PO TABS: 1.0000 | ORAL_TABLET | Freq: Every day | ORAL | 1 refills | Status: DC
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
## 2016-04-04 NOTE — ED Notes (Signed)
MD at bedside. 
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
## 2016-04-04 NOTE — Discharge Instructions (Signed)
Apply antibiotic ointment to the wound, over the counter pain medications as needed

## 2016-12-10 ENCOUNTER — Encounter (HOSPITAL_COMMUNITY): Payer: Self-pay | Admitting: Emergency Medicine

## 2016-12-10 ENCOUNTER — Emergency Department (HOSPITAL_COMMUNITY): Payer: PRIVATE HEALTH INSURANCE

## 2016-12-10 ENCOUNTER — Emergency Department (HOSPITAL_COMMUNITY)
Admission: EM | Admit: 2016-12-10 | Discharge: 2016-12-10 | Disposition: A | Discharge status: Home / Self Care | Payer: PRIVATE HEALTH INSURANCE | Attending: Emergency Medicine | Admitting: Emergency Medicine

## 2016-12-10 DIAGNOSIS — S0083XA Contusion of other part of head, initial encounter: Secondary | ICD-10-CM | POA: Insufficient documentation

## 2016-12-10 DIAGNOSIS — Y939 Activity, unspecified: Secondary | ICD-10-CM | POA: Diagnosis not present

## 2016-12-10 DIAGNOSIS — Y929 Unspecified place or not applicable: Secondary | ICD-10-CM | POA: Insufficient documentation

## 2016-12-10 DIAGNOSIS — S0990XA Unspecified injury of head, initial encounter: Secondary | ICD-10-CM

## 2016-12-10 DIAGNOSIS — Y999 Unspecified external cause status: Secondary | ICD-10-CM | POA: Diagnosis not present

## 2016-12-10 DIAGNOSIS — I10 Essential (primary) hypertension: Secondary | ICD-10-CM | POA: Diagnosis not present

## 2016-12-10 DIAGNOSIS — F17210 Nicotine dependence, cigarettes, uncomplicated: Secondary | ICD-10-CM | POA: Insufficient documentation

## 2016-12-10 DIAGNOSIS — M542 Cervicalgia: Secondary | ICD-10-CM | POA: Diagnosis not present

## 2016-12-10 MED ORDER — IBUPROFEN 800 MG PO TABS
800.0000 mg | ORAL_TABLET | Freq: Once | ORAL | Status: AC
  Administered 2016-12-10: 800 mg via ORAL
  Filled 2016-12-10: qty 1

## 2016-12-10 MED ORDER — OXYCODONE-ACETAMINOPHEN 5-325 MG PO TABS
1.0000 | ORAL_TABLET | Freq: Once | ORAL | Status: AC
Start: 2016-12-10 — End: 2016-12-10
  Administered 2016-12-10: 1 via ORAL
  Filled 2016-12-10: qty 1
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
## 2016-12-10 NOTE — ED Notes (Signed)
Pt ambulated to restroom. 

## 2016-12-10 NOTE — ED Notes (Signed)
Patient transported to CT 

## 2017-03-07 ENCOUNTER — Emergency Department (HOSPITAL_COMMUNITY)
Admission: EM | Admit: 2017-03-07 | Discharge: 2017-03-07 | Disposition: A | Discharge status: Home / Self Care | Payer: PRIVATE HEALTH INSURANCE | Attending: Emergency Medicine | Admitting: Emergency Medicine

## 2017-03-07 ENCOUNTER — Emergency Department (HOSPITAL_COMMUNITY): Payer: PRIVATE HEALTH INSURANCE

## 2017-03-07 ENCOUNTER — Encounter (HOSPITAL_COMMUNITY): Payer: Self-pay | Admitting: *Deleted

## 2017-03-07 DIAGNOSIS — I10 Essential (primary) hypertension: Secondary | ICD-10-CM | POA: Insufficient documentation

## 2017-03-07 DIAGNOSIS — F17210 Nicotine dependence, cigarettes, uncomplicated: Secondary | ICD-10-CM | POA: Diagnosis not present

## 2017-03-07 DIAGNOSIS — Z79899 Other long term (current) drug therapy: Secondary | ICD-10-CM | POA: Insufficient documentation

## 2017-03-07 DIAGNOSIS — J020 Streptococcal pharyngitis: Secondary | ICD-10-CM | POA: Diagnosis not present

## 2017-03-07 DIAGNOSIS — J029 Acute pharyngitis, unspecified: Secondary | ICD-10-CM | POA: Diagnosis present

## 2017-03-07 LAB — RAPID STREP SCREEN (MED CTR MEBANE ONLY): Streptococcus, Group A Screen (Direct): POSITIVE — AB

## 2017-03-07 MED ORDER — ALBUTEROL SULFATE HFA 108 (90 BASE) MCG/ACT IN AERS
2.0000 | INHALATION_SPRAY | RESPIRATORY_TRACT | Status: DC | PRN
  Administered 2017-03-07: 2 via RESPIRATORY_TRACT
  Filled 2017-03-07: qty 6.7

## 2017-03-07 MED ORDER — HYDRALAZINE HCL 25 MG PO TABS
25.0000 mg | ORAL_TABLET | Freq: Once | ORAL | Status: AC
  Administered 2017-03-07: 25 mg via ORAL
  Filled 2017-03-07: qty 1

## 2017-03-07 MED ORDER — PENICILLIN G BENZATHINE 1200000 UNIT/2ML IM SUSP
1.2000 10*6.[IU] | Freq: Once | INTRAMUSCULAR | Status: AC
Start: 2017-03-07 — End: 2017-03-07
  Administered 2017-03-07: 1.2 10*6.[IU] via INTRAMUSCULAR
  Filled 2017-03-07: qty 2

## 2017-03-07 MED ORDER — LISINOPRIL-HYDROCHLOROTHIAZIDE 10-12.5 MG PO TABS: 1.0000 | ORAL_TABLET | Freq: Every day | ORAL | 6 refills | Status: DC

## 2017-03-07 MED ORDER — CLONIDINE HCL 0.2 MG PO TABS
0.2000 mg | ORAL_TABLET | Freq: Once | ORAL | Status: AC
  Administered 2017-03-07: 0.2 mg via ORAL
  Filled 2017-03-07: qty 1

## 2017-03-07 MED ORDER — METOPROLOL TARTRATE 25 MG PO TABS
25.0000 mg | ORAL_TABLET | Freq: Once | ORAL | Status: AC
  Administered 2017-03-07: 25 mg via ORAL
  Filled 2017-03-07: qty 1

## 2017-03-07 NOTE — ED Triage Notes (Signed)
Pt in c/o sore throat since last night, denies fever, reports nasal drainage, productive cough noted during assessment, no distress noted

## 2017-03-07 NOTE — ED Notes (Signed)
Pt states she has been out of her BP medication "for awhile" and that she has been unable to afford an appointment to get a prescription refill.

## 2017-03-07 NOTE — ED Provider Notes (Signed)
[facility] Provider Note   CSN: [id number] Arrival date & time: 03/07/17  0909     History   Chief Complaint Chief Complaint  Patient presents with  . Sore Throat    HPI Sharon Dyer is a 51 y.o. female with PMH of hypertension, back pain and obesity. She has been off of her BP medications for over a year and has family hx of stroke due to uncontrolled hypertension.  She comes to the ER for sore throat that started yesterday. She has associated wheezing and cough. She has not had fever, N,V,D, weakness, or confusion. Incidentally her BP is found to be systolic greater than 200. No headache, weakness or change of vision. HPI  Past Medical History:  Diagnosis Date  . Back pain   . Hypertension   . Obesity     There are no active problems to display for this patient.   Past Surgical History:  Procedure Laterality Date  . ECTOPIC PREGNANCY SURGERY     2    OB History    No data available       Home Medications    Prior to Admission medications   Medication Sig Start Date End Date Taking? Authorizing Provider  lisinopril-hydrochlorothiazide (ZESTORETIC) 10-12.5 MG tablet Take 1 tablet by mouth daily. Patient not taking: Reported on 12/10/2016 04/04/16   Sharon Dibbles, MD  traMADol (ULTRAM) 50 MG tablet Take 1 tablet (50 mg total) by mouth every 6 (six) hours as needed. Patient not taking: Reported on 12/10/2016 03/27/15   Sharon Dyer, April, MD    Family History History reviewed. No pertinent family history.  Social History Social History  Substance Use Topics  . Smoking status: Current Every Day Smoker    Packs/day: 1.00    Types: Cigarettes  . Smokeless tobacco: Never Used  . Alcohol use Yes     Comment: twice a year     Allergies   Patient has no known allergies.   Review of Systems Review of Systems Negative ROS aside from pertinent positives and negatives as listed in HPI    Physical Exam Updated Vital Signs BP (!) 154/81 (BP Location:  Right Arm)   Pulse 61   Temp 98 F (36.7 C) (Oral)   Resp 16   Ht 5' 8.5" (1.74 m)   Wt 136.1 kg (300 lb)   LMP 01/12/2015   SpO2 97%   BMI 44.95 kg/m   Physical Exam  Constitutional: She appears well-developed and well-nourished.  Obese, african american female  HENT:  Head: Normocephalic and atraumatic.  Mouth/Throat: Uvula is midline and mucous membranes are normal. Posterior oropharyngeal edema and posterior oropharyngeal erythema present. No oropharyngeal exudate.  Eyes: Pupils are equal, round, and reactive to light. Conjunctivae are normal.  Neck: Trachea normal, normal range of motion and full passive range of motion without pain. Neck supple.  Cardiovascular: Normal rate, regular rhythm and normal pulses.   Pulmonary/Chest: Effort normal and breath sounds normal. Chest wall is not dull to percussion. She exhibits no tenderness, no crepitus, no edema, no deformity and no retraction.  Congested cough and wheezing  Abdominal: Soft. Normal appearance and bowel sounds are normal.  Musculoskeletal: Normal range of motion.  Lymphadenopathy:       Head (right side): No tonsillar adenopathy present.       Head (left side): No tonsillar adenopathy present.  Neurological: She is alert. She has normal strength.  Skin: Skin is warm, dry and intact.  Psychiatric: She has a normal  mood and affect. Her speech is normal and behavior is normal. Judgment and thought content normal. Cognition and memory are normal.  Nursing note and vitals reviewed.    ED Treatments / Results  Labs (all labs ordered are listed, but only abnormal results are displayed) Labs Reviewed  RAPID STREP SCREEN (NOT AT [facility]) - Abnormal; Notable for the following:       Result Value   Streptococcus, Group A Screen (Direct) POSITIVE (*)    All other components within normal limits    EKG  EKG Interpretation None       Radiology Dg Chest 2 View  Result Date: 03/07/2017 CLINICAL DATA:  Productive cough  for 10 days. EXAM: CHEST  2 VIEW COMPARISON:  04/04/2016 and prior radiographs FINDINGS: Upper limits normal heart size and peribronchial thickening again noted. There is no evidence of focal airspace disease, pulmonary edema, suspicious pulmonary nodule/mass, pleural effusion, or pneumothorax. No acute bony abnormalities are identified. IMPRESSION: 1. No evidence of acute cardiopulmonary disease. 2. Chronic peribronchial thickening. Electronically Signed   By: Harmon Pier M.D.   On: 03/07/2017 10:33    Procedures Procedures (including critical care time)  Medications Ordered in ED Medications  albuterol (PROVENTIL HFA;VENTOLIN HFA) 108 (90 Base) MCG/ACT inhaler 2 puff (not administered)  cloNIDine (CATAPRES) tablet 0.2 mg (0.2 mg Oral Given 03/07/17 1001)  penicillin g benzathine (BICILLIN LA) 1200000 UNIT/2ML injection 1.2 Million Units (1.2 Million Units Intramuscular Given 03/07/17 1043)  metoprolol tartrate (LOPRESSOR) tablet 25 mg (25 mg Oral Given 03/07/17 1053)  hydrALAZINE (APRESOLINE) tablet 25 mg (25 mg Oral Given 03/07/17 1053)    Initial Impression / Assessment and Plan / ED Course  I have reviewed the triage vital signs and the nursing notes.  Pertinent labs & imaging results that were available during my care of the patient were reviewed by me and considered in my medical decision making (see chart for details).    Positive strep screen --> IM Penicillin Cough/congestion --> HFA albuterol inhaler  She was given 0.2 mg Clonidine once for Hypertension, her BP was resistant to this and is still systolic > 200. She was therefore given 25 mg Hydralazine and 25 mg Metoprolol for better control. Will recheck in one hour.---> BP now 154/81.  Will give PCP and Cardiology follow-up and refill her home BP medications. DASH diet recommended at home.     Blood pressure (!) 154/81, pulse 61, temperature 98 F (36.7 C), temperature source Oral, resp. rate 16, height 5' 8.5" (1.74 m),  weight 136.1 kg (300 lb), last menstrual period 01/12/2015, SpO2 97 %.  Sharon Dyer has been evaluated today in the emergency department. The appropriate screening and testing was been performed and I believe the patient to be medically stable for discharge.   Return signs and symptoms have been discussed with the patient and/or caregivers and they have voiced their understanding. The patient has agreed to follow-up with their primary care provider or the referred specialist.         Final Clinical Impressions(s) / ED Diagnoses   Final diagnoses:  Hypertension, unspecified type  Strep throat    New Prescriptions New Prescriptions   No medications on file     Marlon Pel, Cordelia Poche 03/07/17 1202    Arby Barrette, MD 03/08/17 1336

## 2017-04-13 ENCOUNTER — Emergency Department (HOSPITAL_COMMUNITY): Payer: PRIVATE HEALTH INSURANCE

## 2017-04-13 ENCOUNTER — Encounter (HOSPITAL_COMMUNITY): Payer: Self-pay | Admitting: Emergency Medicine

## 2017-04-13 ENCOUNTER — Other Ambulatory Visit: Payer: Self-pay

## 2017-04-13 ENCOUNTER — Emergency Department (HOSPITAL_COMMUNITY)
Admission: EM | Admit: 2017-04-13 | Discharge: 2017-04-13 | Disposition: A | Discharge status: Home / Self Care | Payer: PRIVATE HEALTH INSURANCE | Attending: Emergency Medicine | Admitting: Emergency Medicine

## 2017-04-13 DIAGNOSIS — F410 Panic disorder [episodic paroxysmal anxiety] without agoraphobia: Secondary | ICD-10-CM

## 2017-04-13 DIAGNOSIS — I10 Essential (primary) hypertension: Secondary | ICD-10-CM | POA: Insufficient documentation

## 2017-04-13 DIAGNOSIS — Z79899 Other long term (current) drug therapy: Secondary | ICD-10-CM | POA: Insufficient documentation

## 2017-04-13 DIAGNOSIS — F17210 Nicotine dependence, cigarettes, uncomplicated: Secondary | ICD-10-CM | POA: Insufficient documentation

## 2017-04-13 DIAGNOSIS — R0602 Shortness of breath: Secondary | ICD-10-CM | POA: Diagnosis present

## 2017-04-13 DIAGNOSIS — F430 Acute stress reaction: Secondary | ICD-10-CM

## 2017-04-13 LAB — CBC
HCT: 38.8 % (ref 36.0–46.0)
Hemoglobin: 12.8 g/dL (ref 12.0–15.0)
MCH: 29.8 pg (ref 26.0–34.0)
MCHC: 33 g/dL (ref 30.0–36.0)
MCV: 90.4 fL (ref 78.0–100.0)
Platelets: 184 10*3/uL (ref 150–400)
RBC: 4.29 MIL/uL (ref 3.87–5.11)
RDW: 13.8 % (ref 11.5–15.5)
WBC: 10.5 10*3/uL (ref 4.0–10.5)

## 2017-04-13 LAB — SALICYLATE LEVEL: Salicylate Lvl: <7 mg/dL (ref 2.8–30.0)

## 2017-04-13 LAB — TROPONIN I: Troponin I: <0.03 ng/mL (ref <0.03)

## 2017-04-13 LAB — ETHANOL: Alcohol, Ethyl (B): <10 mg/dL (ref <10)

## 2017-04-13 LAB — COMPREHENSIVE METABOLIC PANEL
ALBUMIN: 3.3 g/dL — AB (ref 3.5–5.0)
ALK PHOS: 58 U/L (ref 38–126)
ALT: 12 U/L — AB (ref 14–54)
AST: 17 U/L (ref 15–41)
Anion gap: 10 (ref 5–15)
BUN: 22 mg/dL — AB (ref 6–20)
CHLORIDE: 102 mmol/L (ref 101–111)
CO2: 25 mmol/L (ref 22–32)
CREATININE: 0.7 mg/dL (ref 0.44–1.00)
Calcium: 8.4 mg/dL — ABNORMAL LOW (ref 8.9–10.3)
GFR calc Af Amer: >60 mL/min (ref >60)
GFR calc non Af Amer: >60 mL/min (ref >60)
GLUCOSE: 105 mg/dL — AB (ref 65–99)
Potassium: 3.5 mmol/L (ref 3.5–5.1)
SODIUM: 137 mmol/L (ref 135–145)
Total Bilirubin: 0.3 mg/dL (ref 0.3–1.2)
Total Protein: 7.1 g/dL (ref 6.5–8.1)

## 2017-04-13 LAB — ACETAMINOPHEN LEVEL

## 2017-04-13 MED ORDER — HYDROCHLOROTHIAZIDE 12.5 MG PO CAPS
12.5000 mg | ORAL_CAPSULE | Freq: Once | ORAL | Status: AC
  Administered 2017-04-13: 12.5 mg via ORAL
  Filled 2017-04-13: qty 1

## 2017-04-13 MED ORDER — LISINOPRIL 10 MG PO TABS
10.0000 mg | ORAL_TABLET | Freq: Once | ORAL | Status: AC
  Administered 2017-04-13: 10 mg via ORAL
  Filled 2017-04-13: qty 1

## 2017-04-13 MED ORDER — LISINOPRIL-HYDROCHLOROTHIAZIDE 10-12.5 MG PO TABS: 1.0000 | ORAL_TABLET | Freq: Every day | ORAL | 1 refills | Status: DC

## 2017-04-13 MED ORDER — HYDROXYZINE HCL 25 MG PO TABS: 25.0000 mg | ORAL_TABLET | Freq: Four times a day (QID) | ORAL | 0 refills | Status: DC | PRN

## 2017-04-13 NOTE — ED Notes (Signed)
Bed: [facility] Expected date:  Expected time:  Means of arrival:  Comments: 
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
## 2017-04-13 NOTE — ED Triage Notes (Signed)
Pt reports waking up a couple of hours ago and felt anxious and then began having suicidal ideation. Pt denies having any plan and reports having difficulty sleeping and shortness of breath.

## 2017-04-13 NOTE — BH Assessment (Signed)
[facility] Assessment Progress Note  Per Nira ConnJason Berry, NP pt does not meet inpt criteria. Pt provided with OPT resources to follow up with regarding her anxiety symptoms. EDP Tresa EndoKelly, PA and pt's nurse Roseanna RainbowIlene, RN notified of recommendation and in agreement.   Sharon BruinsAquicha Valery Dyer, MSW, LCSW Therapeutic Triage Specialist  [phone]

## 2017-04-13 NOTE — ED Provider Notes (Signed)
[facility]-EMERGENCY DEPT Provider Note   CSN: [id number] Arrival date & time: 04/13/17  0301     History   Chief Complaint Chief Complaint  Patient presents with  . Suicidal    HPI Sharon Dyer is a 51 y.o. female.  51 year old female with a history of hypertension, obesity, back pain presents to the emergency department for shortness of breath.  She states that she awoke from sleep with a central chest pressure and difficulty breathing.  She states that she felt as though her throat was closing.  This was shortly followed by onset of suicidal ideations.  Patient states that she has been having suicidal thoughts lately.  She denies any known history of depression or anxiety.  She further denies any recent stressors such as loss of job, financial difficulties, or disruption within her family.  She is not currently seen by a psychiatrist or therapist.  She states that her breathing has greatly improved now that she has been able to calm down.  No medications taken prior to arrival.      Past Medical History:  Diagnosis Date  . Back pain   . Hypertension   . Obesity     There are no active problems to display for this patient.   Past Surgical History:  Procedure Laterality Date  . ECTOPIC PREGNANCY SURGERY     2    OB History    No data available       Home Medications    Prior to Admission medications   Medication Sig Start Date End Date Taking? Authorizing Provider  ibuprofen (ADVIL,MOTRIN) 200 MG tablet Take 1,000 mg every 6 (six) hours as needed by mouth for moderate pain.   Yes [provider]  hydrOXYzine (ATARAX/VISTARIL) 25 MG tablet Take 1 tablet (25 mg total) every 6 (six) hours as needed by mouth for anxiety. 04/13/17   Antony Madura, PA-C  lisinopril-hydrochlorothiazide (ZESTORETIC) 10-12.5 MG tablet Take 1 tablet daily by mouth. 04/13/17   Antony Madura, PA-C  traMADol (ULTRAM) 50 MG tablet Take 1 tablet (50 mg total) by  mouth every 6 (six) hours as needed. Patient not taking: Reported on 12/10/2016 03/27/15   Nicanor Alcon, April, MD    Family History History reviewed. No pertinent family history.  Social History Social History   Tobacco Use  . Smoking status: Current Every Day Smoker    Packs/day: 1.00    Types: Cigarettes  . Smokeless tobacco: Never Used  Substance Use Topics  . Alcohol use: Yes    Comment: twice a year  . Drug use: No     Allergies   Patient has no known allergies.   Review of Systems Review of Systems Ten systems reviewed and are negative for acute change, except as noted in the HPI.    Physical Exam Updated Vital Signs BP (!) 168/80 (BP Location: Left Arm)   Pulse 62   Temp 97.7 F (36.5 C) (Oral)   Resp 17   Ht 5\' 7"  (1.702 m)   Wt 136.1 kg (300 lb)   LMP 01/12/2015   SpO2 98%   BMI 46.99 kg/m   Physical Exam  Constitutional: She is oriented to person, place, and time. She appears well-developed and well-nourished. No distress.  Nontoxic appearing; obese  HENT:  Head: Normocephalic and atraumatic.  Eyes: Conjunctivae and EOM are normal. No scleral icterus.  Neck: Normal range of motion.  Cardiovascular: Normal rate, regular rhythm and intact distal pulses.  Pulmonary/Chest: Effort normal.  No stridor. No respiratory distress. She has no wheezes.  Lungs CTAB  Musculoskeletal: Normal range of motion.  Neurological: She is alert and oriented to person, place, and time. She exhibits normal muscle tone. Coordination normal.  Skin: Skin is warm and dry. No rash noted. She is not diaphoretic. No erythema. No pallor.  Psychiatric: Her behavior is normal.  Calm cooperative, mildly anxious. SI without plan.  Nursing note and vitals reviewed.    ED Treatments / Results  Labs (all labs ordered are listed, but only abnormal results are displayed) Labs Reviewed  COMPREHENSIVE METABOLIC PANEL - Abnormal; Notable for the following components:      Result Value    Glucose, Bld 105 (*)    BUN 22 (*)    Calcium 8.4 (*)    Albumin 3.3 (*)    ALT 12 (*)    All other components within normal limits  ACETAMINOPHEN LEVEL - Abnormal; Notable for the following components:   Acetaminophen (Tylenol), Serum <10 (*)    All other components within normal limits  ETHANOL  SALICYLATE LEVEL  CBC  TROPONIN I    EKG  EKG Interpretation  Date/Time:  Sunday April 13 2017 03:09:55 EST Ventricular Rate:  72 PR Interval:    QRS Duration: 82 QT Interval:  422 QTC Calculation: 456 R Axis:   3 Text Interpretation:  Sinus rhythm Low voltage, precordial leads Probable anteroseptal infarct, old No significant change since last tracing Confirmed by Yao, David H ([id number]) on 04/13/2017 6:02:21 AM       Radiology Dg Chest 2 View  Result Date: 04/13/2017 CLINICAL DATA:  Shortness of breath. Insomnia. Suicidal ideation. History of hypertension, smoker. EXAM: CHEST  2 VIEW COMPARISON:  Chest radiograph March 07, 2017 FINDINGS: Cardiomediastinal silhouette is normal. No pleural effusions or focal consolidations. Mild bronchitic changes, improved from prior radiograph. Trachea projects midline and there is no pneumothorax. Soft tissue planes and included osseous structures are non-suspicious. Mild degenerative change of the thoracic spine. IMPRESSION: Mild bronchitic changes.  No focal consolidation. Electronically Signed   By: Courtnay  Bloomer M.D.   On: 04/13/2017 04:31    Procedures Procedures (including critical care time)  Medications Ordered in ED Medications  lisinopril (PRINIVIL,ZESTRIL) tablet 10 mg (10 mg Oral Given 04/13/17 0559)  hydrochlorothiazide (MICROZIDE) capsule 12.5 mg (12.5 mg Oral Given 04/13/17 0559)     Initial Impression / Assessment and Plan / ED Course  I have reviewed the triage vital signs and the nursing notes.  Pertinent labs & imaging results that were available during my care of the patient were reviewed by me and considered in  my medical decision making (see chart for details).     51  year old female presents to the emergency department for shortness of breath.  She states that she has been waking from sleep with chest tightness and sensation of her throat closing.  This has been accompanied by suicidal ideations.  She denies a history of anxiety and feels much better compared to arrival.  TTS was consulted given patient reporting suicidal thoughts, though she denies this when evaluated by psychiatry.  TTS feels the patient is stable for outpatient follow-up with a counselor or therapist.  They have provided a list of resources.  Symptoms are consistent with likely panic attack.  Workup today does not suggest cardiac etiology.  Troponin is negative and EKG without signs of acute ischemia.  Chest x-ray without pneumonia, pneumothorax, pleural effusion, mediastinal widening.  Patient to be discharged with a refill  of her blood pressure medication as well as Atarax to take as needed for anxiety.  I do not believe further emergent workup is indicated at this time.  Return precautions discussed and provided. Patient discharged in stable condition with no unaddressed concerns.   Final Clinical Impressions(s) / ED Diagnoses   Final diagnoses:  Panic attack as reaction to stress    New Prescriptions This SmartLink is deprecated. Use AVSMEDLIST instead to display the medication list for a patient.   Antony MaduraHumes, Kein Carlberg, PA-C 04/13/17 [id number]    Charlynne PanderYao, David Hsienta, MD 04/13/17 [phone]

## 2017-04-13 NOTE — ED Notes (Signed)
ED Provider at bedside. 

## 2017-07-01 ENCOUNTER — Encounter: Payer: Self-pay | Admitting: Specialist

## 2017-10-18 ENCOUNTER — Other Ambulatory Visit: Payer: Self-pay

## 2017-10-18 ENCOUNTER — Emergency Department (HOSPITAL_COMMUNITY)
Admission: EM | Admit: 2017-10-18 | Discharge: 2017-10-19 | Disposition: A | Discharge status: Home / Self Care | Payer: Managed Care, Other (non HMO) | Attending: Emergency Medicine | Admitting: Emergency Medicine

## 2017-10-18 ENCOUNTER — Encounter (HOSPITAL_COMMUNITY): Payer: Self-pay

## 2017-10-18 DIAGNOSIS — F17210 Nicotine dependence, cigarettes, uncomplicated: Secondary | ICD-10-CM | POA: Diagnosis not present

## 2017-10-18 DIAGNOSIS — J069 Acute upper respiratory infection, unspecified: Secondary | ICD-10-CM | POA: Diagnosis not present

## 2017-10-18 DIAGNOSIS — Z79899 Other long term (current) drug therapy: Secondary | ICD-10-CM | POA: Diagnosis not present

## 2017-10-18 DIAGNOSIS — I10 Essential (primary) hypertension: Secondary | ICD-10-CM | POA: Insufficient documentation

## 2017-10-18 DIAGNOSIS — B9789 Other viral agents as the cause of diseases classified elsewhere: Secondary | ICD-10-CM | POA: Diagnosis not present

## 2017-10-18 DIAGNOSIS — R05 Cough: Secondary | ICD-10-CM | POA: Diagnosis present

## 2017-10-18 LAB — GROUP A STREP BY PCR: GROUP A STREP BY PCR: NOT DETECTED

## 2017-10-18 NOTE — ED Triage Notes (Signed)
Pt endorses sore throat that began this evening, had strep a couple months ago and this feels the same. VSS. Afebrile.

## 2017-10-19 MED ORDER — ACETAMINOPHEN 325 MG PO TABS
650.0000 mg | ORAL_TABLET | Freq: Once | ORAL | Status: AC
  Administered 2017-10-19: 650 mg via ORAL
  Filled 2017-10-19: qty 2

## 2017-10-19 NOTE — ED Provider Notes (Signed)
[facility] EMERGENCY DEPARTMENT Provider Note   CSN: [id number] Arrival date & time: 10/18/17  2116     History   Chief Complaint Chief Complaint  Patient presents with  . Sore Throat    HPI Sharon Dyer is a 52 y.o. female past medical history of hypertension, presenting to the ED with acute onset of cough and sore throat that began this evening.  Patient states throat pain is worse with swallowing.  States cough is nonproductive.  Denies associated congestion, rhinorrhea, ear pain, fever, or other complaints.  No medications tried prior to arrival.  The history is provided by the patient.    Past Medical History:  Diagnosis Date  . Back pain   . Hypertension   . Obesity     There are no active problems to display for this patient.   Past Surgical History:  Procedure Laterality Date  . ECTOPIC PREGNANCY SURGERY     2     OB History   None      Home Medications    Prior to Admission medications   Medication Sig Start Date End Date Taking? Authorizing Provider  hydrOXYzine (ATARAX/VISTARIL) 25 MG tablet Take 1 tablet (25 mg total) every 6 (six) hours as needed by mouth for anxiety. 04/13/17   Antony Madura, PA-C  ibuprofen (ADVIL,MOTRIN) 200 MG tablet Take 1,000 mg every 6 (six) hours as needed by mouth for moderate pain.    [provider]  lisinopril-hydrochlorothiazide (ZESTORETIC) 10-12.5 MG tablet Take 1 tablet daily by mouth. 04/13/17   Antony Madura, PA-C  traMADol (ULTRAM) 50 MG tablet Take 1 tablet (50 mg total) by mouth every 6 (six) hours as needed. Patient not taking: Reported on 12/10/2016 03/27/15   Nicanor Alcon, April, MD    Family History History reviewed. No pertinent family history.  Social History Social History   Tobacco Use  . Smoking status: Current Every Day Smoker    Packs/day: 1.00    Types: Cigarettes  . Smokeless tobacco: Never Used  Substance Use Topics  . Alcohol use: Yes    Comment: twice a year  .  Drug use: No     Allergies   Patient has no known allergies.   Review of Systems Review of Systems  Constitutional: Negative for fever.  HENT: Positive for sore throat. Negative for congestion, ear pain, rhinorrhea, trouble swallowing and voice change.   Respiratory: Positive for cough. Negative for shortness of breath.      Physical Exam Updated Vital Signs BP (!) 156/62 (BP Location: Right Arm)   Pulse 72   Temp 97.6 F (36.4 C) (Oral)   Resp 16   Ht  (1.727 m)   Wt 136.1 kg (300 lb)   LMP 01/12/2015   SpO2 99%   BMI 45.61 kg/m   Physical Exam  Constitutional: She appears well-developed and well-nourished. She does not appear ill. No distress.  HENT:  Head: Normocephalic and atraumatic.  Right Ear: Tympanic membrane and ear canal normal.  Left Ear: Tympanic membrane and ear canal normal.  Nose: Nose normal.  Mouth/Throat: Uvula is midline. No trismus in the jaw. No uvula swelling. Posterior oropharyngeal erythema present. No posterior oropharyngeal edema.  Eyes: Conjunctivae are normal.  Neck: Normal range of motion. Neck supple.  Cardiovascular: Normal rate, regular rhythm and intact distal pulses.  Pulmonary/Chest: Effort normal and breath sounds normal. No respiratory distress.  Lymphadenopathy:    She has no cervical adenopathy.  Psychiatric: She has a normal  mood and affect. Her behavior is normal.  Nursing note and vitals reviewed.    ED Treatments / Results  Labs (all labs ordered are listed, but only abnormal results are displayed) Labs Reviewed  GROUP A STREP BY PCR    EKG None  Radiology No results found.  Procedures Procedures (including critical care time)  Medications Ordered in ED Medications  acetaminophen (TYLENOL) tablet 650 mg (has no administration in time range)     Initial Impression / Assessment and Plan / ED Course  I have reviewed the triage vital signs and the nursing notes.  Pertinent labs & imaging results  that were available during my care of the patient were reviewed by me and considered in my medical decision making (see chart for details).    Patients symptoms are consistent with URI, likely viral etiology. Afebrile, tolerating secretions.  Lungs clear to auscultation bilaterally. Strep PCR done in triage is negative. Discussed that antibiotics are not indicated for viral infections. Pt will be discharged with symptomatic treatment.  Verbalizes understanding and is agreeable with plan. Pt is hemodynamically stable & in NAD prior to dc.  Discussed results, findings, treatment and follow up. Patient advised of return precautions. Patient verbalized understanding and agreed with plan.  Final Clinical Impressions(s) / ED Diagnoses   Final diagnoses:  Viral URI with cough    ED Discharge Orders    None       Robinson, [place] N, PA-C 10/19/17 0007    Derwood Kaplan, MD 10/19/17 0800

## 2017-10-19 NOTE — Discharge Instructions (Signed)
Please read instructions below.  You can take tylenol or ibuprofen as needed for sore throat or fever.  Drink plenty of water.  Use saline nasal spray for congestion. Follow up with your primary care provider as needed.  Return to the ER for inability to swallow liquids, difficulty breathing, or new or worsening symptoms.  

## 2017-12-17 ENCOUNTER — Emergency Department (HOSPITAL_COMMUNITY): Payer: Managed Care, Other (non HMO)

## 2017-12-17 ENCOUNTER — Emergency Department (HOSPITAL_COMMUNITY)
Admission: EM | Admit: 2017-12-17 | Discharge: 2017-12-17 | Disposition: A | Discharge status: Home / Self Care | Payer: Managed Care, Other (non HMO) | Attending: Emergency Medicine | Admitting: Emergency Medicine

## 2017-12-17 ENCOUNTER — Encounter (HOSPITAL_COMMUNITY): Payer: Self-pay | Admitting: Emergency Medicine

## 2017-12-17 DIAGNOSIS — R0789 Other chest pain: Secondary | ICD-10-CM | POA: Diagnosis not present

## 2017-12-17 DIAGNOSIS — I10 Essential (primary) hypertension: Secondary | ICD-10-CM | POA: Diagnosis not present

## 2017-12-17 DIAGNOSIS — R079 Chest pain, unspecified: Secondary | ICD-10-CM | POA: Diagnosis present

## 2017-12-17 DIAGNOSIS — F17210 Nicotine dependence, cigarettes, uncomplicated: Secondary | ICD-10-CM | POA: Insufficient documentation

## 2017-12-17 LAB — I-STAT TROPONIN, ED
TROPONIN I, POC: 0 ng/mL (ref 0.00–0.08)
Troponin i, poc: 0.01 ng/mL (ref 0.00–0.08)

## 2017-12-17 LAB — BASIC METABOLIC PANEL
Anion gap: 11 (ref 5–15)
BUN: 18 mg/dL (ref 6–20)
CO2: 25 mmol/L (ref 22–32)
CREATININE: 0.97 mg/dL (ref 0.44–1.00)
Calcium: 9 mg/dL (ref 8.9–10.3)
Chloride: 103 mmol/L (ref 98–111)
GFR calc Af Amer: >60 mL/min (ref >60)
Glucose, Bld: 89 mg/dL (ref 70–99)
Potassium: 3.7 mmol/L (ref 3.5–5.1)
SODIUM: 139 mmol/L (ref 135–145)

## 2017-12-17 LAB — CBC
HCT: 41.2 % (ref 36.0–46.0)
HEMOGLOBIN: 13.3 g/dL (ref 12.0–15.0)
MCH: 29.4 pg (ref 26.0–34.0)
MCHC: 32.3 g/dL (ref 30.0–36.0)
MCV: 91.2 fL (ref 78.0–100.0)
Platelets: 179 10*3/uL (ref 150–400)
RBC: 4.52 MIL/uL (ref 3.87–5.11)
RDW: 13.2 % (ref 11.5–15.5)
WBC: 8.7 10*3/uL (ref 4.0–10.5)

## 2017-12-17 LAB — I-STAT BETA HCG BLOOD, ED (MC, WL, AP ONLY)

## 2017-12-17 LAB — D-DIMER, QUANTITATIVE (NOT AT ARMC): D DIMER QUANT: 1.51 ug{FEU}/mL — AB (ref 0.00–0.50)

## 2017-12-17 MED ORDER — IOPAMIDOL (ISOVUE-370) INJECTION 76%
100.0000 mL | Freq: Once | INTRAVENOUS | Status: AC | PRN
  Administered 2017-12-17: 100 mL via INTRAVENOUS

## 2017-12-17 MED ORDER — IOPAMIDOL (ISOVUE-370) INJECTION 76%
INTRAVENOUS | Status: AC
  Filled 2017-12-17: qty 100

## 2017-12-17 NOTE — ED Provider Notes (Signed)
[facility] EMERGENCY DEPARTMENT Provider Note   CSN: [id number] Arrival date & time: 12/17/17  7035     History   Chief Complaint Chief Complaint  Patient presents with  . Chest Pain    HPI Sharon Dyer is a 52 y.o. female.  Patient states that she has had some chest discomfort with sweating today.  Patient feels fine now  The history is provided by the patient. No language interpreter was used.  Chest Pain   This is a new problem. The current episode started less than 1 hour ago. The problem occurs rarely. The problem has been resolved. The pain is associated with exertion. The pain is present in the substernal region. The pain is at a severity of 3/10. The pain is moderate. The quality of the pain is described as dull. The pain does not radiate. Pertinent negatives include no abdominal pain, no back pain, no cough and no headaches.  Pertinent negatives for past medical history include no seizures.    Past Medical History:  Diagnosis Date  . Back pain   . Hypertension   . Obesity     There are no active problems to display for this patient.   Past Surgical History:  Procedure Laterality Date  . ECTOPIC PREGNANCY SURGERY     2     OB History   None      Home Medications    Prior to Admission medications   Medication Sig Start Date End Date Taking? Authorizing Provider  ibuprofen (ADVIL,MOTRIN) 200 MG tablet Take 800 mg by mouth every 6 (six) hours as needed for moderate pain.    Yes [provider]  lisinopril-hydrochlorothiazide (ZESTORETIC) 10-12.5 MG tablet Take 1 tablet daily by mouth. 04/13/17  Yes Antonietta Breach, PA-C  hydrOXYzine (ATARAX/VISTARIL) 25 MG tablet Take 1 tablet (25 mg total) every 6 (six) hours as needed by mouth for anxiety. Patient not taking: Reported on 12/17/2017 04/13/17   Antonietta Breach, PA-C  traMADol (ULTRAM) 50 MG tablet Take 1 tablet (50 mg total) by mouth every 6 (six) hours as needed. Patient not taking:  Reported on 12/10/2016 03/27/15   Randal Buba, April, MD    Family History History reviewed. No pertinent family history.  Social History Social History   Tobacco Use  . Smoking status: Current Every Day Smoker    Packs/day: 1.00    Types: Cigarettes  . Smokeless tobacco: Never Used  Substance Use Topics  . Alcohol use: Yes    Comment: twice a year  . Drug use: No     Allergies   Patient has no known allergies.   Review of Systems Review of Systems  Constitutional: Negative for appetite change and fatigue.  HENT: Negative for congestion, ear discharge and sinus pressure.   Eyes: Negative for discharge.  Respiratory: Negative for cough.   Cardiovascular: Positive for chest pain.  Gastrointestinal: Negative for abdominal pain and diarrhea.  Genitourinary: Negative for frequency and hematuria.  Musculoskeletal: Negative for back pain.  Skin: Negative for rash.  Neurological: Negative for seizures and headaches.  Psychiatric/Behavioral: Negative for hallucinations.     Physical Exam Updated Vital Signs BP 140/85   Pulse 67   Temp 98.3 F (36.8 C) (Oral)   Resp 18   Ht _0  (1.727 m)   Wt 136.1 kg (300 lb)   LMP 01/12/2015   SpO2 94%   BMI 45.61 kg/m   Physical Exam  Constitutional: She is oriented to person, place, and time.  She appears well-developed.  HENT:  Head: Normocephalic.  Eyes: Conjunctivae and EOM are normal. No scleral icterus.  Neck: Neck supple. No thyromegaly present.  Cardiovascular: Normal rate and regular rhythm. Exam reveals no gallop and no friction rub.  No murmur heard. Pulmonary/Chest: No stridor. She has no wheezes. She has no rales. She exhibits no tenderness.  Abdominal: She exhibits no distension. There is no tenderness. There is no rebound.  Musculoskeletal: Normal range of motion. She exhibits no edema.  Lymphadenopathy:    She has no cervical adenopathy.  Neurological: She is oriented to person, place, and time. She exhibits  normal muscle tone. Coordination normal.  Skin: No rash noted. No erythema.  Psychiatric: She has a normal mood and affect. Her behavior is normal.     ED Treatments / Results  Labs (all labs ordered are listed, but only abnormal results are displayed) Labs Reviewed  D-DIMER, QUANTITATIVE (NOT AT [facility]) - Abnormal; Notable for the following components:      Result Value   D-Dimer, Quant 1.51 (*)    All other components within normal limits  BASIC METABOLIC PANEL  CBC  I-STAT TROPONIN, ED  I-STAT BETA HCG BLOOD, ED ([facility], [facility], [facility] ONLY)  I-STAT TROPONIN, ED    EKG EKG Interpretation  Date/Time:  Wednesday December 17 2017 09:10:56 EDT Ventricular Rate:  61 PR Interval:    QRS Duration: 84 QT Interval:  451 QTC Calculation: 455 R Axis:   14 Text Interpretation:  Sinus rhythm Low voltage, precordial leads Confirmed by Milton Ferguson [id number]) on 12/17/2017 11:07:11 AM   Radiology Dg Chest 2 View  Result Date: 12/17/2017 CLINICAL DATA:  Chest pain EXAM: CHEST - 2 VIEW COMPARISON:  April 13, 2017. FINDINGS: There is no edema or consolidation. The heart size and pulmonary vascularity are normal. No adenopathy. There is degenerative change in the thoracic spine. IMPRESSION: No edema or consolidation. Electronically Signed   By: Lowella Grip III M.D.   On: 12/17/2017 09:34   Ct Angio Chest Pe W And/or Wo Contrast  Result Date: 12/17/2017 CLINICAL DATA:  Chest pain particularly left-sided radiating to the left arm, some nausea and sweating EXAM: CT ANGIOGRAPHY CHEST WITH CONTRAST TECHNIQUE: Multidetector CT imaging of the chest was performed using the standard protocol during bolus administration of intravenous contrast. Multiplanar CT image reconstructions and MIPs were obtained to evaluate the vascular anatomy. CONTRAST:  178m ISOVUE-370 IOPAMIDOL (ISOVUE-370) INJECTION 76% COMPARISON:  Chest x-ray of 12/17/2016 FINDINGS: Cardiovascular: The pulmonary arteries are moderately well  opacified and no evidence of pulmonary embolism is seen. The peripheral branches to the lower lobes are less well evaluated due to motion obscuring detail. However no definite central embolus is seen. The thoracic aorta is not as well opacified but no acute abnormality is evident. The heart is mildly enlarged and there are coronary artery calcifications primarily in the distribution of the left anterior descending coronary artery. Mediastinum/Nodes: No mediastinal or hilar adenopathy is noted with small mediastinal lymph nodes present diffusely. One of the larger prevascular anterior mediastinal nodes on image 109 series 7 measures 13 mm. The thyroid gland is unremarkable. Lungs/Pleura: On lung window images mild paraseptal emphysema is noted. A 5 mm noncalcified nodule is present within the apex of the right upper lobe on image 44 series 6. A 6 mm nodule is noted abutting the minor fissure within the anterior inferior right upper lobe possibly representing a perifissural lymph node. No nodules are present throughout the left lung. No pleural effusion  is seen. Upper Abdomen: The portion of the upper abdomen that is visualized is unremarkable. Musculoskeletal: There are degenerative changes diffusely throughout the thoracic spine. No compression deformity is seen. Review of the MIP images confirms the above findings. IMPRESSION: 1. No evidence of acute pulmonary embolism. Motion does obscure pulmonary artery branches to the to the lower lobes but no embolus is evident. 2. No pneumonia or pleural effusion. 3. Two noncalcified lung nodules on the right, one of which appears to represent a perifissural lymph node. No follow-up needed if patient is low-risk. Non-contrast chest CT can be considered in 12 months if patient is high-risk. This recommendation follows the consensus statement: Guidelines for Management of Incidental Pulmonary Nodules Detected on CT Images: From the Fleischner Society 2017; Radiology 2017;  284:228-243. Electronically Signed   By: Ivar Drape M.D.   On: 12/17/2017 14:06    Procedures Procedures (including critical care time)  Medications Ordered in ED Medications  iopamidol (ISOVUE-370) 76 % injection (has no administration in time range)  iopamidol (ISOVUE-370) 76 % injection 100 mL (100 mLs Intravenous Contrast Given 12/17/17 1338)     Initial Impression / Assessment and Plan / ED Course  I have reviewed the triage vital signs and the nursing notes.  Pertinent labs & imaging results that were available during my care of the patient were reviewed by me and considered in my medical decision making (see chart for details).     Patient's EKG chest x-ray CT angios all negative.  2 troponins negative CBC and be met unremarkable.  Patient with atypical chest discomfort.  She has no discomfort at discharge.  She will follow-up with cardiology for possible stress test and return if any problems.  Patient is placed on an aspirin a day for now  Final Clinical Impressions(s) / ED Diagnoses   Final diagnoses:  Atypical chest pain    ED Discharge Orders    None       Milton Ferguson, MD 12/17/17 1527

## 2017-12-17 NOTE — Discharge Instructions (Signed)
Take 1 aspirin a day.  And follow-up with cardiology in a week.  Return if any problems

## 2017-12-17 NOTE — ED Triage Notes (Signed)
Pt states she started having sharp left sided cp this morning- radiating into left arm. She felt nauseous and diaphoretic with the episode. Pt also started having a headache when she had the CP.

## 2017-12-22 ENCOUNTER — Emergency Department (HOSPITAL_COMMUNITY): Payer: Managed Care, Other (non HMO)

## 2017-12-22 ENCOUNTER — Encounter (HOSPITAL_COMMUNITY): Payer: Self-pay | Admitting: Emergency Medicine

## 2017-12-22 ENCOUNTER — Other Ambulatory Visit: Payer: Self-pay

## 2017-12-22 ENCOUNTER — Inpatient Hospital Stay (HOSPITAL_COMMUNITY)
Admission: EM | Admit: 2017-12-22 | Discharge: 2017-12-24 | DRG: 189 | Disposition: A | Discharge status: Home / Self Care | Payer: Managed Care, Other (non HMO) | Attending: Internal Medicine | Admitting: Internal Medicine

## 2017-12-22 DIAGNOSIS — I252 Old myocardial infarction: Secondary | ICD-10-CM | POA: Diagnosis not present

## 2017-12-22 DIAGNOSIS — Z23 Encounter for immunization: Secondary | ICD-10-CM

## 2017-12-22 DIAGNOSIS — R079 Chest pain, unspecified: Secondary | ICD-10-CM | POA: Diagnosis present

## 2017-12-22 DIAGNOSIS — M549 Dorsalgia, unspecified: Secondary | ICD-10-CM | POA: Diagnosis present

## 2017-12-22 DIAGNOSIS — N183 Chronic kidney disease, stage 3 (moderate): Secondary | ICD-10-CM | POA: Diagnosis present

## 2017-12-22 DIAGNOSIS — J40 Bronchitis, not specified as acute or chronic: Secondary | ICD-10-CM | POA: Diagnosis not present

## 2017-12-22 DIAGNOSIS — N179 Acute kidney failure, unspecified: Secondary | ICD-10-CM | POA: Diagnosis present

## 2017-12-22 DIAGNOSIS — R918 Other nonspecific abnormal finding of lung field: Secondary | ICD-10-CM | POA: Diagnosis present

## 2017-12-22 DIAGNOSIS — I272 Pulmonary hypertension, unspecified: Secondary | ICD-10-CM | POA: Diagnosis present

## 2017-12-22 DIAGNOSIS — F17210 Nicotine dependence, cigarettes, uncomplicated: Secondary | ICD-10-CM | POA: Diagnosis present

## 2017-12-22 DIAGNOSIS — E041 Nontoxic single thyroid nodule: Secondary | ICD-10-CM | POA: Diagnosis present

## 2017-12-22 DIAGNOSIS — J9601 Acute respiratory failure with hypoxia: Principal | ICD-10-CM | POA: Diagnosis present

## 2017-12-22 DIAGNOSIS — Z6841 Body Mass Index (BMI) 40.0 and over, adult: Secondary | ICD-10-CM

## 2017-12-22 DIAGNOSIS — I129 Hypertensive chronic kidney disease with stage 1 through stage 4 chronic kidney disease, or unspecified chronic kidney disease: Secondary | ICD-10-CM | POA: Diagnosis present

## 2017-12-22 DIAGNOSIS — Z791 Long term (current) use of non-steroidal anti-inflammatories (NSAID): Secondary | ICD-10-CM | POA: Diagnosis not present

## 2017-12-22 DIAGNOSIS — I351 Nonrheumatic aortic (valve) insufficiency: Secondary | ICD-10-CM | POA: Diagnosis not present

## 2017-12-22 DIAGNOSIS — Z79899 Other long term (current) drug therapy: Secondary | ICD-10-CM | POA: Diagnosis not present

## 2017-12-22 DIAGNOSIS — R0902 Hypoxemia: Secondary | ICD-10-CM | POA: Diagnosis present

## 2017-12-22 DIAGNOSIS — R0602 Shortness of breath: Secondary | ICD-10-CM | POA: Diagnosis not present

## 2017-12-22 HISTORY — DX: Anxiety disorder, unspecified: F41.9

## 2017-12-22 LAB — BRAIN NATRIURETIC PEPTIDE: B Natriuretic Peptide: 100.7 pg/mL — ABNORMAL HIGH (ref 0.0–100.0)

## 2017-12-22 LAB — CBC
HCT: 45.6 % (ref 36.0–46.0)
HEMOGLOBIN: 14 g/dL (ref 12.0–15.0)
MCH: 29 pg (ref 26.0–34.0)
MCHC: 30.7 g/dL (ref 30.0–36.0)
MCV: 94.6 fL (ref 78.0–100.0)
Platelets: 198 10*3/uL (ref 150–400)
RBC: 4.82 MIL/uL (ref 3.87–5.11)
RDW: 13.2 % (ref 11.5–15.5)
WBC: 8.8 10*3/uL (ref 4.0–10.5)

## 2017-12-22 LAB — BASIC METABOLIC PANEL
ANION GAP: 11 (ref 5–15)
BUN: 17 mg/dL (ref 6–20)
CALCIUM: 9 mg/dL (ref 8.9–10.3)
CHLORIDE: 108 mmol/L (ref 98–111)
CO2: 24 mmol/L (ref 22–32)
Creatinine, Ser: 1.38 mg/dL — ABNORMAL HIGH (ref 0.44–1.00)
GFR calc non Af Amer: 43 mL/min — ABNORMAL LOW (ref >60)
GFR, EST AFRICAN AMERICAN: 50 mL/min — AB (ref >60)
GLUCOSE: 100 mg/dL — AB (ref 70–99)
Potassium: 3.7 mmol/L (ref 3.5–5.1)
Sodium: 143 mmol/L (ref 135–145)

## 2017-12-22 LAB — I-STAT ARTERIAL BLOOD GAS, ED
Acid-base deficit: 4 mmol/L — ABNORMAL HIGH (ref 0.0–2.0)
BICARBONATE: 21.3 mmol/L (ref 20.0–28.0)
O2 Saturation: 96 %
PCO2 ART: 39.8 mmHg (ref 32.0–48.0)
PO2 ART: 83 mmHg (ref 83.0–108.0)
TCO2: 23 mmol/L (ref 22–32)
pH, Arterial: 7.337 — ABNORMAL LOW (ref 7.350–7.450)

## 2017-12-22 LAB — I-STAT TROPONIN, ED: Troponin i, poc: 0 ng/mL (ref 0.00–0.08)

## 2017-12-22 MED ORDER — ALBUTEROL SULFATE (2.5 MG/3ML) 0.083% IN NEBU
5.0000 mg | INHALATION_SOLUTION | Freq: Once | RESPIRATORY_TRACT | Status: AC
  Administered 2017-12-22: 5 mg via RESPIRATORY_TRACT
  Filled 2017-12-22: qty 6

## 2017-12-22 MED ORDER — METHYLPREDNISOLONE SODIUM SUCC 125 MG IJ SOLR
125.0000 mg | Freq: Once | INTRAMUSCULAR | Status: AC
  Administered 2017-12-22: 125 mg via INTRAVENOUS
  Filled 2017-12-22: qty 2

## 2017-12-22 MED ORDER — IOPAMIDOL (ISOVUE-370) INJECTION 76%
INTRAVENOUS | Status: AC
  Filled 2017-12-22: qty 100

## 2017-12-22 MED ORDER — PNEUMOCOCCAL VAC POLYVALENT 25 MCG/0.5ML IJ INJ
0.5000 mL | INJECTION | INTRAMUSCULAR | Status: DC
  Filled 2017-12-22: qty 0.5

## 2017-12-22 MED ORDER — IOPAMIDOL (ISOVUE-370) INJECTION 76%
100.0000 mL | Freq: Once | INTRAVENOUS | Status: AC | PRN
  Administered 2017-12-22: 100 mL via INTRAVENOUS

## 2017-12-22 MED ORDER — IPRATROPIUM-ALBUTEROL 0.5-2.5 (3) MG/3ML IN SOLN
3.0000 mL | Freq: Once | RESPIRATORY_TRACT | Status: AC
  Administered 2017-12-22: 3 mL via RESPIRATORY_TRACT
  Filled 2017-12-22: qty 3

## 2017-12-22 NOTE — ED Provider Notes (Signed)
6:57 PM Patient signed out to me at shift change.  Patient with cough for several days, got significantly worse today.  Started feeling short of breath.  Cough is nonproductive, dry, persistent.  When arrived to ED, oxygen saturation was 60% on room air.  In  order to maintain oxygen saturation above 90%, patient had to be started on 10 L of oxygen.  Patient was also given a breathing treatment.  Work-up with no significant abnormalities, CT angiography pending at time of signout.   CT came back unremarkable.  I reassessed patient.  Patient is continuously coughing during my evaluation.  Lungs are clear however.  Cough sounds spastic.  I was able to turn her oxygen down to 3 L, she is maintaining oxygen saturation between 95 and 100%.  She states she is feeling slightly better.  I will give her another breathing treatment and start on steroids.  She will need admission for hypoxia.  She is still tachypneic at this time, but able to speak full sentences.  7:21 PM Spoke with hospitalist, will admit.   Vitals:   12/22/17 1430 12/22/17 1445 12/22/17 1500 12/22/17 1745  BP: (!) 107/58 (!) 107/54 (!) 119/103 (!) 114/58  Pulse: 88 84 93 83  Resp: (!) 34 (!) 34 (!) 28 (!) 23  Temp:      SpO2: 97% 99% 98% 100%      Jaynie CrumbleKirichenko, Siah Kannan, PA-C 12/22/17 Adria [id number]    Knapp, Jon, MD 12/22/17 2307

## 2017-12-22 NOTE — ED Triage Notes (Signed)
Patient states at 1245 today she started coughing and now feels like she is unable to catch her breath. Patient speaking in complete sentences. Complains of productive cough with yellow sputum. Patient alert and oriented at this time.

## 2017-12-22 NOTE — H&P (Addendum)
[facility]                                                                                    Patient Demographics  Sharon Dyer, is a 52 y.o. female  CSN: [id number]  MRN: [id number]  DOB - Oct 29, 1965  Admit Date - 12/22/2017  Outpatient Primary MD for the patient is Patient, No Pcp Per   With History of -  Past Medical History:  Diagnosis Date  . Back pain   . Hypertension   . Obesity       Past Surgical History:  Procedure Laterality Date  . ECTOPIC PREGNANCY SURGERY     2    in for   Chief Complaint  Patient presents with  . Shortness of Breath     HPI  Sharon Dyer  is a 52 y.o. female, past medical history significant for hypertension, chronic back pain and bilateral ankle pain on Motrin daily, presenting with few days history of cough, productive of whitish phlegm with increasing shortness of breath, got worse today.  No history of fever chills.  Reports nausea but no vomiting.  CT of chest came back unremarkable.  Patient was noted to be hypoxemic in the emergency room and she was started on 10 L/min by nasal cannula and taper down to 3 L later.  Patient received steroids in the emergency room with neb treatments.  Patient was in the emergency room 2 days ago for chest pain.    Review of Systems    In addition to the HPI above,  No Fever-chills, No Headache, No changes with Vision or hearing, No problems swallowing food or Liquids, No Chest pain,  No Abdominal pain, No Nausea or Vommitting, Bowel movements are regular, No Blood in stool or Urine, No dysuria, No new skin rashes or bruises, No new joints pains-aches,  No new weakness, tingling, numbness in any extremity, No recent weight gain or loss, No polyuria, polydypsia or polyphagia, No significant Mental Stressors.  A full 10 point Review of Systems was done, except as stated above, all other Review of Systems were negative.   Social History Social History   Tobacco Use   . Smoking status: Current Every Day Smoker    Packs/day: 1.00    Types: Cigarettes  . Smokeless tobacco: Never Used  Substance Use Topics  . Alcohol use: Yes    Comment: twice a year     Family History Significant for a father with throat cancer, a mother with breast cancer  Prior to Admission medications   Medication Sig Start Date End Date Taking? Authorizing Provider  hydrOXYzine (ATARAX/VISTARIL) 25 MG tablet Take 1 tablet (25 mg total) every 6 (six) hours as needed by mouth for anxiety. Patient not taking: Reported on 12/17/2017 04/13/17   Antony Madura, PA-C  ibuprofen (ADVIL,MOTRIN) 200 MG tablet Take 800 mg by mouth every 6 (six) hours as needed for moderate pain.     [provider]  lisinopril-hydrochlorothiazide (ZESTORETIC) 10-12.5 MG tablet Take 1 tablet daily by mouth. 04/13/17   Antony Madura, PA-C  traMADol (ULTRAM) 50 MG tablet Take 1 tablet (50 mg total) by mouth every 6 (six)  hours as needed. Patient not taking: Reported on 12/10/2016 03/27/15   Palumbo, April, MD    No Known Allergies  Physical Exam  Vitals  Blood pressure 138/62, pulse 89, temperature 98.1 F (36.7 C), resp. rate (!) 21, last menstrual period 01/12/2015, SpO2 100 %.   1. General Young, obese, extremely pleasant in moderate respiratory distress  2. Normal affect and insight, Not Suicidal or Homicidal, Awake Alert, Oriented X 3.  3. No F.N deficits, grossly, patient moving all extremities.  4. Ears and Eyes appear Normal, Conjunctivae clear, PERRLA. Moist Oral Mucosa.  5. Supple Neck, No JVD, No cervical lymphadenopathy appriciated, No Carotid Bruits.  6. Symmetrical Chest wall movement, scattered rhonchi, no wheezing.  7. RRR, No Gallops, Rubs or Murmurs, No Parasternal Heave.  8. Positive Bowel Sounds, Abdomen Soft, Non tender, No organomegaly appriciated,No rebound -guarding or rigidity.  9.  No Cyanosis, Normal Skin Turgor, No Skin Rash or Bruise.  10. Good muscle tone,   joints appear normal , no effusions, Normal ROM.    Data Review  CBC Recent Labs  Lab 12/17/17 0913 12/22/17 1344  WBC 8.7 8.8  HGB 13.3 14.0  HCT 41.2 45.6  PLT 179 198  MCV 91.2 94.6  MCH 29.4 29.0  MCHC 32.3 30.7  RDW 13.2 13.2   ------------------------------------------------------------------------------------------------------------------  Chemistries  Recent Labs  Lab 12/17/17 0913 12/22/17 1344  NA 139 143  K 3.7 3.7  CL 103 108  CO2 25 24  GLUCOSE 89 100*  BUN 18 17  CREATININE 0.97 1.38*  CALCIUM 9.0 9.0   ------------------------------------------------------------------------------------------------------------------ estimated creatinine clearance is 69.1 mL/min (A) (by C-G formula based on SCr of 1.38 mg/dL (H)). ------------------------------------------------------------------------------------------------------------------ No results for input(s): TSH, T4TOTAL, T3FREE, THYROIDAB in the last 72 hours.  Invalid input(s): FREET3   Coagulation profile No results for input(s): INR, PROTIME in the last 168 hours. ------------------------------------------------------------------------------------------------------------------- No results for input(s): DDIMER in the last 72 hours. -------------------------------------------------------------------------------------------------------------------  Cardiac Enzymes No results for input(s): CKMB, TROPONINI, MYOGLOBIN in the last 168 hours.  Invalid input(s): CK ------------------------------------------------------------------------------------------------------------------ Invalid input(s): POCBNP   ---------------------------------------------------------------------------------------------------------------  Urinalysis    Component Value Date/Time   COLORURINE YELLOW 03/26/2015 2352   APPEARANCEUR CLOUDY (A) 03/26/2015 2352   LABSPEC 1.019 03/26/2015 2352   PHURINE 5.5 03/26/2015 2352    GLUCOSEU NEGATIVE 03/26/2015 2352   HGBUR LARGE (A) 03/26/2015 2352   BILIRUBINUR NEGATIVE 03/26/2015 2352   KETONESUR NEGATIVE 03/26/2015 2352   PROTEINUR 30 (A) 03/26/2015 2352   UROBILINOGEN 1.0 03/26/2015 2352   NITRITE NEGATIVE 03/26/2015 2352   LEUKOCYTESUR SMALL (A) 03/26/2015 2352    ----------------------------------------------------------------------------------------------------------------  ABG  Arterial Blood Gas result:  pO2 83; pCO2 39.8; pH 7.337;  HCO3 21.3, %O2 Sat 96%.   Imaging results:   Dg Chest 2 View  Result Date: 12/17/2017 CLINICAL DATA:  Chest pain EXAM: CHEST - 2 VIEW COMPARISON:  April 13, 2017. FINDINGS: There is no edema or consolidation. The heart size and pulmonary vascularity are normal. No adenopathy. There is degenerative change in the thoracic spine. IMPRESSION: No edema or consolidation. Electronically Signed   By: Bretta BangWilliam  Woodruff III M.D.   On: 12/17/2017 09:34   Ct Angio Chest Pe W/cm &/or Wo Cm  Result Date: 12/22/2017 CLINICAL DATA:  History of hypertension, now with shortness of breath. Evaluate for pulmonary embolism. EXAM: CT ANGIOGRAPHY CHEST WITH CONTRAST TECHNIQUE: Multidetector CT imaging of the chest was performed using the standard protocol during bolus administration of intravenous contrast. Multiplanar CT image  reconstructions and MIPs were obtained to evaluate the vascular anatomy. CONTRAST:  ISOVUE-370 IOPAMIDOL (ISOVUE-370) INJECTION 76% COMPARISON:  Chest CT-12/17/2017 FINDINGS: Vascular Findings: There is adequate opacification of the pulmonary arterial system with the main pulmonary artery measuring 248 Hounsfield units. There are no discrete filling defects within the pulmonary arterial tree to the level of the bilateral subsegmental pulmonary arteries. Evaluation of the distal subsegmental pulmonary arteries is degraded secondary to suboptimal vessel opacification as well as a combination of patient respiratory artifact  and quantum mottle artifact due to patient body habitus. The main pulmonary artery is enlarged measuring 3.6 cm in diameter (image 39, series 5). Borderline cardiomegaly. Coronary artery calcifications. No pericardial effusion. No evidence of thoracic aortic aneurysm or dissection on this nongated examination. Atherosclerotic plaque within the aortic arch, not resulting in hemodynamically significant stenosis. Bovine configuration of the aortic arch. The branch vessels of the aortic arch appear widely patent throughout their imaged course. Review of the MIP images confirms the above findings. ---------------------------------------------------------------------------------- Nonvascular Findings: Mediastinum/Lymph Nodes: Grossly unchanged mediastinal and hilar lymphadenopathy with index AP window lymph node measuring approximately 1.4 cm in greatest short axis diameter (image 30, series 5, index right hilar lymph node measuring 1.3 cm (image 42, series 5) and index left hilar lymph nodes measuring approximately 1 cm and 1.7 cm in diameter respectively (images 46 and 40, series 5). Bilateral axillary lymph nodes are not enlarged by size criteria. Lungs/Pleura: Minimal dependent subpleural ground-glass atelectasis. No discrete focal airspace opacities. No pleural effusion or pneumothorax. The central pulmonary airways appear patent. Unchanged punctate (approximately 0.5 cm) right upper lobe pulmonary nodules (images 30 and 38, series 7). Upper abdomen: Limited early arterial phase evaluation the upper abdomen is normal. Musculoskeletal: No acute or aggressive osseous abnormalities. Stigmata of DISH within the thoracic spine. A note is made of an approximately 1.9 x 1.6 cm hypoattenuating nodule within the right lobe of the thyroid (image 5, series 5). IMPRESSION: 1. No acute cardiopulmonary disease. Specifically, no evidence of pulmonary embolism to the level the bilateral subsegmental pulmonary arteries. 2. Unchanged  5 mm right upper lobe pulmonary nodules. No follow-up needed if patient is low-risk (and has no known or suspected primary neoplasm). Non-contrast chest CT can be considered in 12 months if patient is high-risk. This recommendation follows the consensus statement: Guidelines for Management of Incidental Pulmonary Nodules Detected on CT Images: From the Fleischner Society 2017; Radiology 2017; 284:228-243. 3. Unchanged mediastinal and hilar lymphadenopathy, while potentially reactive in etiology, inflammatory and malignant etiologies are excluded on the basis of this examination. If not previously performed, referral to pulmonary medicine could be arranged as indicated. 4. Incidentally noted approximately 1.9 cm right-sided thyroid nodule. Further evaluation with dedicated nonemergent thyroid ultrasound could be performed as indicated. 5.  Aortic Atherosclerosis (ICD10-I70.0). Electronically Signed   By: Simonne Come M.D.   On: 12/22/2017 18:43   Ct Angio Chest Pe W And/or Wo Contrast  Result Date: 12/17/2017 CLINICAL DATA:  Chest pain particularly left-sided radiating to the left arm, some nausea and sweating EXAM: CT ANGIOGRAPHY CHEST WITH CONTRAST TECHNIQUE: Multidetector CT imaging of the chest was performed using the standard protocol during bolus administration of intravenous contrast. Multiplanar CT image reconstructions and MIPs were obtained to evaluate the vascular anatomy. CONTRAST:  ISOVUE-370 IOPAMIDOL (ISOVUE-370) INJECTION 76% COMPARISON:  Chest x-ray of 12/17/2016 FINDINGS: Cardiovascular: The pulmonary arteries are moderately well opacified and no evidence of pulmonary embolism is seen. The peripheral branches to the lower lobes are  less well evaluated due to motion obscuring detail. However no definite central embolus is seen. The thoracic aorta is not as well opacified but no acute abnormality is evident. The heart is mildly enlarged and there are coronary artery calcifications primarily  in the distribution of the left anterior descending coronary artery. Mediastinum/Nodes: No mediastinal or hilar adenopathy is noted with small mediastinal lymph nodes present diffusely. One of the larger prevascular anterior mediastinal nodes on image 109 series 7 measures 13 mm. The thyroid gland is unremarkable. Lungs/Pleura: On lung window images mild paraseptal emphysema is noted. A 5 mm noncalcified nodule is present within the apex of the right upper lobe on image 44 series 6. A 6 mm nodule is noted abutting the minor fissure within the anterior inferior right upper lobe possibly representing a perifissural lymph node. No nodules are present throughout the left lung. No pleural effusion is seen. Upper Abdomen: The portion of the upper abdomen that is visualized is unremarkable. Musculoskeletal: There are degenerative changes diffusely throughout the thoracic spine. No compression deformity is seen. Review of the MIP images confirms the above findings. IMPRESSION: 1. No evidence of acute pulmonary embolism. Motion does obscure pulmonary artery branches to the to the lower lobes but no embolus is evident. 2. No pneumonia or pleural effusion. 3. Two noncalcified lung nodules on the right, one of which appears to represent a perifissural lymph node. No follow-up needed if patient is low-risk. Non-contrast chest CT can be considered in 12 months if patient is high-risk. This recommendation follows the consensus statement: Guidelines for Management of Incidental Pulmonary Nodules Detected on CT Images: From the Fleischner Society 2017; Radiology 2017; 284:228-243. Electronically Signed   By: Dwyane Dee M.D.   On: 12/17/2017 14:06   Dg Chest Portable 1 View  Result Date: 12/22/2017 CLINICAL DATA:  chief complaint of dyspnea that began around 12:45 PM. She reports a productive cough with yellow sputum. She is current, every day smoker. EXAM: PORTABLE CHEST 1 VIEW COMPARISON:  7/10/9 FINDINGS: Cardiac silhouette  is normal in size. No mediastinal or hilar masses. Lungs are clear.  No pleural effusion or pneumothorax. Skeletal structures are grossly intact. IMPRESSION: No active disease. Electronically Signed   By: Amie Portland M.D.   On: 12/22/2017 14:11    My personal review of EKG: Rhythm NSR, 93 bpm with evidence of old anteroseptal MI    Assessment & Plan  1.  Hypoxemia , probably viral versus reactive airway disease acute, patient is a smoker      Steroids IV/nebulizer treatments/start IV antibiotics in case of bronchitis      Check lower extremity Dopplers      CT of chest was negative for pneumonia.  No PE. 2.  Old anteroseptal MI by EKG      Check echocardiogram 3.  Bilateral ankle arthritic pains on nonsteroidals     Norco as needed , hold nonsteroidals 4.  Obesity      Advised about weight loss 5.  Hypertension     Continue with lisinopril/hydrochlorothiazide   DVT Prophylaxis Lovenox  AM Labs Ordered, also please review Full Orders    Code Status full  Disposition Plan: Home  Time spent in minutes : 44 minutes  Condition GUARDED   @SIGNATURE @

## 2017-12-22 NOTE — ED Notes (Signed)
Patient transported to CT 

## 2017-12-22 NOTE — ED Provider Notes (Signed)
[facility]   CSN: [id number] Arrival date & time: 12/22/17  1310     History   Chief Complaint Chief Complaint  Patient presents with  . Shortness of Breath    HPI Sharon Dyer is a 52 y.o. female with past medical history of obesity, hypertension, significant smoking history, who presents today for evaluation of shortness of breath.  She reports that at around 1245 she had a coughing fit and then since then has been very short of breath.  In triage she had a pulse ox in the 60s on room air.  She improved to 95% on 4 L nasal cannula.  She denies any known cardiac or pulmonary history.  Was well prior to the coughing fit.  She has a 30 pack year smoking history.  She does not have inhalers at home, has never been diagnosed with COPD.    HPI  Past Medical History:  Diagnosis Date  . Back pain   . Hypertension   . Obesity     There are no active problems to display for this patient.   Past Surgical History:  Procedure Laterality Date  . ECTOPIC PREGNANCY SURGERY     2     OB History   None      Home Medications    Prior to Admission medications   Medication Sig Start Date End Date Taking? Authorizing Provider  hydrOXYzine (ATARAX/VISTARIL) 25 MG tablet Take 1 tablet (25 mg total) every 6 (six) hours as needed by mouth for anxiety. Patient not taking: Reported on 12/17/2017 04/13/17   Antony MaduraHumes, Kelly, PA-C  ibuprofen (ADVIL,MOTRIN) 200 MG tablet Take 800 mg by mouth every 6 (six) hours as needed for moderate pain.     [provider]  lisinopril-hydrochlorothiazide (ZESTORETIC) 10-12.5 MG tablet Take 1 tablet daily by mouth. 04/13/17   Antony MaduraHumes, Kelly, PA-C  traMADol (ULTRAM) 50 MG tablet Take 1 tablet (50 mg total) by mouth every 6 (six) hours as needed. Patient not taking: Reported on 12/10/2016 03/27/15   Palumbo, April, MD    Family History No family history on file.  Social History Social History    Tobacco Use  . Smoking status: Current Every Day Smoker    Packs/day: 1.00    Types: Cigarettes  . Smokeless tobacco: Never Used  Substance Use Topics  . Alcohol use: Yes    Comment: twice a year  . Drug use: No     Allergies   Patient has no known allergies.   Review of Systems Review of Systems  Constitutional: Negative for chills and fever.  Respiratory: Positive for cough and shortness of breath. Negative for chest tightness.   Cardiovascular: Negative for chest pain.  Gastrointestinal: Negative for abdominal pain.  All other systems reviewed and are negative.    Physical Exam Updated Vital Signs BP (!) 119/103   Pulse 93   Temp 98.1 F (36.7 C)   Resp (!) 28   LMP 01/12/2015   SpO2 98%   Physical Exam  Constitutional: She appears well-developed and well-nourished.  HENT:  Head: Normocephalic and atraumatic.  Mouth/Throat: Oropharynx is clear and moist.  Eyes: Conjunctivae are normal.  Neck: Normal range of motion. Neck supple. No JVD present.  Cardiovascular: Normal rate and regular rhythm.  No murmur heard. Pulmonary/Chest: Accessory muscle usage present. Tachypnea noted. She is in respiratory distress. She has wheezes in the right upper field, the right middle field, the left upper field and the  left middle field. She has rhonchi in the right lower field and the left lower field. She has rales in the right lower field and the left lower field. She exhibits no tenderness and no crepitus.  Abdominal: Soft. There is no tenderness.  Musculoskeletal: She exhibits no edema.  1+ pitting edema to bilateral lower legs  Neurological: She is alert.  Skin: Skin is warm and dry.  Psychiatric: She has a normal mood and affect.  Nursing note and vitals reviewed.    ED Treatments / Results  Labs (all labs ordered are listed, but only abnormal results are displayed) Labs Reviewed  BASIC METABOLIC PANEL - Abnormal; Notable for the following components:       Result Value   Glucose, Bld 100 (*)    Creatinine, Ser 1.38 (*)    GFR calc non Af Amer 43 (*)    GFR calc Af Amer 50 (*)    All other components within normal limits  BRAIN NATRIURETIC PEPTIDE - Abnormal; Notable for the following components:   B Natriuretic Peptide 100.7 (*)    All other components within normal limits  I-STAT ARTERIAL BLOOD GAS, ED - Abnormal; Notable for the following components:   pH, Arterial 7.337 (*)    Acid-base deficit 4.0 (*)    All other components within normal limits  CBC  I-STAT TROPONIN, ED    EKG EKG Interpretation  Date/Time:  Monday December 22 2017 13:51:26 EDT Ventricular Rate:  93 PR Interval:    QRS Duration: 82 QT Interval:  376 QTC Calculation: 468 R Axis:   19 Text Interpretation:  Sinus rhythm Low voltage, precordial leads Probable anteroseptal infarct, old No significant change since last tracing Confirmed by Melene Plan [phone]) on 12/22/2017 2:12:48 PM Also confirmed by Melene Plan [phone]), editor Josephine Igo ([id number])  on 12/22/2017 2:36:43 PM   Radiology Dg Chest Portable 1 View  Result Date: 12/22/2017 CLINICAL DATA:  chief complaint of dyspnea that began around 12:45 PM. She reports a productive cough with yellow sputum. She is current, every day smoker. EXAM: PORTABLE CHEST 1 VIEW COMPARISON:  7/10/9 FINDINGS: Cardiac silhouette is normal in size. No mediastinal or hilar masses. Lungs are clear.  No pleural effusion or pneumothorax. Skeletal structures are grossly intact. IMPRESSION: No active disease. Electronically Signed   By: Amie Portland M.D.   On: 12/22/2017 14:11    Procedures Procedures (including critical care time) CRITICAL CARE Performed by: Lyndel Safe Total critical care time: 40 minutes Critical care time was exclusive of separately billable procedures and treating other patients. Critical care was necessary to treat or prevent imminent or life-threatening deterioration. Critical care was time spent  personally by me on the following activities: development of treatment plan with patient and/or surrogate as well as nursing, discussions with consultants, evaluation of patient's response to treatment, examination of patient, obtaining history from patient or surrogate, ordering and performing treatments and interventions, ordering and review of laboratory studies, ordering and review of radiographic studies, pulse oximetry and re-evaluation of patient's condition.  Hypoxia requiring ABG, CTA PE study, oxygen.     Medications Ordered in ED Medications  albuterol (PROVENTIL) (2.5 MG/3ML) 0.083% nebulizer solution 5 mg (5 mg Nebulization Given 12/22/17 1433)     Initial Impression / Assessment and Plan / ED Course  I have reviewed the triage vital signs and the nursing notes.  Pertinent labs & imaging results that were available during my care of the patient were reviewed by me and considered in  my medical decision making (see chart for details).    [name] presents today for evaluation of shortness of breath that began after a coughing fit.  She was initially hypoxic, tachycardic, and hypotensive upon arrival.  She was placed on 10 L/min high flow nasal cannula, which stabilized her condition.  Initial ABG showed borderline hypoxia, as was after she had been on oxygen for about half an hour.  Labs were obtained reviewed.  Plan for CT PE study.   At shift change care was transferred to [facility] who will follow pending studies, re-evaulate and determine disposition.    This patient was seen as a shared visit with Dr. Adela Lank.   Final Clinical Impressions(s) / ED Diagnoses   Final diagnoses:  None    ED Discharge Orders    None       Cristina Gong, PA-C 12/22/17 1602    Melene Plan, DO 12/23/17 [phone]

## 2017-12-22 NOTE — ED Provider Notes (Addendum)
Patient placed in Quick Look pathway, seen and evaluated   Chief Complaint: Dyspnea  HPI:   52 y.o. is a female presenting to the Emergency Department with a chief complaint of dyspnea that began around 12:45 PM. She reports a productive cough with yellow sputum. She is current, every day smoker.  On initial exam, the patient was sitting comfortably in NAD, but several minutes later she became acutely short of breath. SaO2 in the ~60s. She improved to 95% on 4L [place]. Place on non-rebreather and sats improved to the mid-to high 90s.    ROS: Dyspnea, cough  Physical Exam:   Gen: No distress  Neuro: Awake and Alert  Skin: Warm    Focused Exam: Morbidly obese. Bilateral eyes are injected. Heart is RRR, no M/R/G. Mild expiraotory wheezes in the bilateral bases.    Initiation of care has begun. The patient has been counseled on the process, plan, and necessity for staying for the completion/evaluation, and the remainder of the medical screening examination    Barkley BoardsMcDonald, Arriah Wadle A, PA-C 12/22/17 1325    Maecie Sevcik A, PA-C 12/22/17 1331    Charlynne PanderYao, David Hsienta, MD 12/23/17 [phone]

## 2017-12-23 ENCOUNTER — Inpatient Hospital Stay (HOSPITAL_COMMUNITY): Payer: Managed Care, Other (non HMO)

## 2017-12-23 DIAGNOSIS — J9601 Acute respiratory failure with hypoxia: Secondary | ICD-10-CM

## 2017-12-23 DIAGNOSIS — I351 Nonrheumatic aortic (valve) insufficiency: Secondary | ICD-10-CM

## 2017-12-23 LAB — RESPIRATORY PANEL BY PCR
Adenovirus: NOT DETECTED
BORDETELLA PERTUSSIS-RVPCR: NOT DETECTED
CORONAVIRUS HKU1-RVPPCR: NOT DETECTED
Chlamydophila pneumoniae: NOT DETECTED
Coronavirus 229E: NOT DETECTED
Coronavirus NL63: NOT DETECTED
Coronavirus OC43: NOT DETECTED
Influenza A: NOT DETECTED
Influenza B: NOT DETECTED
Metapneumovirus: NOT DETECTED
Mycoplasma pneumoniae: NOT DETECTED
Parainfluenza Virus 1: NOT DETECTED
Parainfluenza Virus 2: NOT DETECTED
Parainfluenza Virus 3: NOT DETECTED
Parainfluenza Virus 4: NOT DETECTED
RHINOVIRUS / ENTEROVIRUS - RVPPCR: NOT DETECTED
Respiratory Syncytial Virus: NOT DETECTED

## 2017-12-23 LAB — TROPONIN I: Troponin I: <0.03 ng/mL (ref <0.03)

## 2017-12-23 LAB — ECHOCARDIOGRAM COMPLETE

## 2017-12-23 LAB — HIV ANTIBODY (ROUTINE TESTING W REFLEX): HIV Screen 4th Generation wRfx: NONREACTIVE

## 2017-12-23 MED ORDER — IPRATROPIUM-ALBUTEROL 0.5-2.5 (3) MG/3ML IN SOLN
3.0000 mL | Freq: Four times a day (QID) | RESPIRATORY_TRACT | Status: DC
  Administered 2017-12-23: 3 mL via RESPIRATORY_TRACT
  Filled 2017-12-23: qty 3

## 2017-12-23 MED ORDER — HYDROCODONE-ACETAMINOPHEN 5-325 MG PO TABS: 1.0000 | ORAL_TABLET | Freq: Four times a day (QID) | ORAL | Status: DC | PRN

## 2017-12-23 MED ORDER — IPRATROPIUM-ALBUTEROL 0.5-2.5 (3) MG/3ML IN SOLN: 3.0000 mL | Freq: Two times a day (BID) | RESPIRATORY_TRACT | Status: DC

## 2017-12-23 MED ORDER — LISINOPRIL-HYDROCHLOROTHIAZIDE 10-12.5 MG PO TABS: 1.0000 | ORAL_TABLET | Freq: Every day | ORAL | Status: DC

## 2017-12-23 MED ORDER — ALBUTEROL SULFATE (2.5 MG/3ML) 0.083% IN NEBU: 2.5000 mg | INHALATION_SOLUTION | RESPIRATORY_TRACT | Status: DC | PRN

## 2017-12-23 MED ORDER — HYDROCHLOROTHIAZIDE 12.5 MG PO CAPS: 12.5000 mg | ORAL_CAPSULE | Freq: Every day | ORAL | Status: DC

## 2017-12-23 MED ORDER — ENOXAPARIN SODIUM 60 MG/0.6ML ~~LOC~~ SOLN
60.0000 mg | SUBCUTANEOUS | Status: DC
Start: 2017-12-23 — End: 2017-12-24
  Administered 2017-12-23: 60 mg via SUBCUTANEOUS
  Filled 2017-12-23: qty 0.6

## 2017-12-23 MED ORDER — ALBUTEROL SULFATE (2.5 MG/3ML) 0.083% IN NEBU: 2.5000 mg | INHALATION_SOLUTION | Freq: Four times a day (QID) | RESPIRATORY_TRACT | Status: DC

## 2017-12-23 MED ORDER — PREDNISONE 50 MG PO TABS
50.0000 mg | ORAL_TABLET | Freq: Every day | ORAL | Status: DC
  Administered 2017-12-24: 50 mg via ORAL
  Filled 2017-12-23: qty 1

## 2017-12-23 MED ORDER — ONDANSETRON HCL 4 MG PO TABS: 4.0000 mg | ORAL_TABLET | Freq: Four times a day (QID) | ORAL | Status: DC | PRN

## 2017-12-23 MED ORDER — SODIUM CHLORIDE 0.9% FLUSH
3.0000 mL | Freq: Two times a day (BID) | INTRAVENOUS | Status: DC
  Administered 2017-12-24: 3 mL via INTRAVENOUS

## 2017-12-23 MED ORDER — ACETAMINOPHEN 325 MG PO TABS
650.0000 mg | ORAL_TABLET | Freq: Four times a day (QID) | ORAL | Status: DC | PRN
  Administered 2017-12-23: 650 mg via ORAL
  Filled 2017-12-23: qty 2

## 2017-12-23 MED ORDER — ONDANSETRON HCL 4 MG/2ML IJ SOLN: 4.0000 mg | Freq: Four times a day (QID) | INTRAMUSCULAR | Status: DC | PRN

## 2017-12-23 MED ORDER — ZOLPIDEM TARTRATE 5 MG PO TABS
5.0000 mg | ORAL_TABLET | Freq: Every evening | ORAL | Status: DC | PRN
  Administered 2017-12-23: 5 mg via ORAL
  Filled 2017-12-23: qty 1

## 2017-12-23 MED ORDER — SODIUM CHLORIDE 0.9 % IV SOLN
INTRAVENOUS | Status: DC
  Administered 2017-12-23 (×2): via INTRAVENOUS

## 2017-12-23 MED ORDER — PNEUMOCOCCAL VAC POLYVALENT 25 MCG/0.5ML IJ INJ
0.5000 mL | INJECTION | Freq: Once | INTRAMUSCULAR | Status: AC
  Administered 2017-12-23: 0.5 mL via INTRAMUSCULAR
  Filled 2017-12-23: qty 0.5

## 2017-12-23 MED ORDER — AZITHROMYCIN 500 MG PO TABS
500.0000 mg | ORAL_TABLET | ORAL | Status: DC
  Administered 2017-12-23 – 2017-12-24 (×2): 500 mg via ORAL
  Filled 2017-12-23 (×2): qty 1

## 2017-12-23 MED ORDER — CALCIUM CARBONATE ANTACID 500 MG PO CHEW
1.0000 | CHEWABLE_TABLET | Freq: Two times a day (BID) | ORAL | Status: DC | PRN
Start: 2017-12-23 — End: 2017-12-24
  Administered 2017-12-23: 200 mg via ORAL
  Filled 2017-12-23: qty 1

## 2017-12-23 MED ORDER — IPRATROPIUM-ALBUTEROL 0.5-2.5 (3) MG/3ML IN SOLN
3.0000 mL | Freq: Two times a day (BID) | RESPIRATORY_TRACT | Status: DC
  Administered 2017-12-23: 3 mL via RESPIRATORY_TRACT
  Filled 2017-12-23: qty 3

## 2017-12-23 MED ORDER — IPRATROPIUM BROMIDE 0.02 % IN SOLN: 0.5000 mg | Freq: Four times a day (QID) | RESPIRATORY_TRACT | Status: DC

## 2017-12-23 MED ORDER — PANTOPRAZOLE SODIUM 20 MG PO TBEC
20.0000 mg | DELAYED_RELEASE_TABLET | Freq: Every day | ORAL | Status: DC
  Administered 2017-12-23 – 2017-12-24 (×2): 20 mg via ORAL
  Filled 2017-12-23 (×2): qty 1

## 2017-12-23 MED ORDER — SODIUM CHLORIDE 0.9 % IV SOLN
1.0000 g | INTRAVENOUS | Status: DC
  Administered 2017-12-23: 1 g via INTRAVENOUS
  Filled 2017-12-23: qty 10

## 2017-12-23 MED ORDER — SODIUM CHLORIDE 0.9% FLUSH: 3.0000 mL | INTRAVENOUS | Status: DC | PRN

## 2017-12-23 MED ORDER — SODIUM CHLORIDE 0.9 % IV SOLN: 250.0000 mL | INTRAVENOUS | Status: DC | PRN

## 2017-12-23 MED ORDER — METHYLPREDNISOLONE SODIUM SUCC 125 MG IJ SOLR
60.0000 mg | Freq: Four times a day (QID) | INTRAMUSCULAR | Status: DC
  Administered 2017-12-23: 60 mg via INTRAVENOUS
  Filled 2017-12-23: qty 2

## 2017-12-23 MED ORDER — ACETAMINOPHEN 650 MG RE SUPP: 650.0000 mg | Freq: Four times a day (QID) | RECTAL | Status: DC | PRN

## 2017-12-23 MED ORDER — LISINOPRIL 10 MG PO TABS: 10.0000 mg | ORAL_TABLET | Freq: Every day | ORAL | Status: DC

## 2017-12-23 NOTE — Progress Notes (Signed)
  Echocardiogram 2D Echocardiogram has been performed.  Mattalyn Anderegg T Journey Ratterman 12/23/2017, 2:21 PM

## 2017-12-23 NOTE — Progress Notes (Signed)
PROGRESS NOTE    Sharon Dyer  ZOX:[id number] DOB: Oct 17, 1965 DOA: 12/22/2017 PCP: Patient, No Pcp Per     Brief Narrative:  Sharon Dyer  is a 52 y.o. female, past medical history significant for hypertension, chronic back pain and bilateral ankle pain on Motrin daily, presenting with few days history of cough, productive of whitish phlegm with increasing shortness of breath.  CT of chest came back unremarkable. Patient was noted to be hypoxemic in the emergency room and she was started on 10 L/min by nasal cannula and taper down to 3 L later.  Patient received steroids in the emergency room with neb treatments.  Patient was in the emergency room 2 days ago for chest pain; was discharged home after normal work up and instructed to follow up with cardiology for possible stress test.   New events last 24 hours / Subjective: States that her breathing is much better today.  Denies any chest pain since last week.  Breathing has improved, but has not been out of bed yet.  Continues to cough, now no longer productive.  Assessment & Plan:   Active Problems:   Hypoxemia  Acute hypoxemic respiratory failure -Could be viral bronchitis in nature, respiratory PCR panel pending -CTA chest negative for pulmonary embolism -Continue azithromycin, wean solumedrol to prednisone -Wean O2   Chest pain -No complaints of CP today -Trop negative x3 -Echocardiogram pending  Essential hypertension -Hold lisinopril, hydrochlorothiazide  AKI on CKD stage 3 -Baseline Cr 0.9 -IVF   5 mm right upper lobe pulmonary nodules -Follow up outpatient  Incidental 1.9 cm right-sided thyroid nodule -Follow up outpatient   DVT prophylaxis: Lovenox Code Status: Full Family Communication: At bedside Disposition Plan: Pending clinical improvement    Consultants:   None  Procedures:   None   Antimicrobials:  Anti-infectives (From admission, onward)   Start     Dose/Rate Route Frequency Ordered Stop     12/23/17 0600  azithromycin (ZITHROMAX) tablet 500 mg     500 mg Oral Every 24 hours 12/23/17 0448 12/30/17 0559   12/23/17 0448  cefTRIAXone (ROCEPHIN) 1 g in sodium chloride 0.9 % 100 mL IVPB     1 g 200 mL/hr over 30 Minutes Intravenous Every 24 hours 12/23/17 0448 12/30/17 0459        Objective: Vitals:   12/22/17 1920 12/22/17 2009 12/23/17 0512 12/23/17 1015  BP:  (!) 134/56 (!) 155/82 137/77  Pulse:  95 85 84  Resp: (!) 21 18 18 18   Temp:  98.9 F (37.2 C) 98.6 F (37 C) 98 F (36.7 C)  TempSrc:  Oral  Oral  SpO2:  100% 100% 99%    Intake/Output Summary (Last 24 hours) at 12/23/2017 1119 Last data filed at 12/23/2017 8119 Gross per 24 hour  Intake 0 ml  Output 0 ml  Net 0 ml   There were no vitals filed for this visit.  Examination:  General exam: Appears calm and comfortable  Respiratory system: Diminished breath sounds, no wheezing, on [place] O2 Respiratory effort normal. Cardiovascular system: S1 & S2 heard, RRR. No JVD, murmurs, rubs, gallops or clicks. No pedal edema. Gastrointestinal system: Abdomen is nondistended, soft and nontender. No organomegaly or masses felt. Normal bowel sounds heard. Central nervous system: Alert and oriented. No focal neurological deficits. Extremities: Symmetric 5 x 5 power. Skin: No rashes, lesions or ulcers Psychiatry: Judgement and insight appear normal. Mood & affect appropriate.   Data Reviewed: I have personally reviewed following labs and  imaging studies  CBC: Recent Labs  Lab 12/17/17 0913 12/22/17 1344  WBC 8.7 8.8  HGB 13.3 14.0  HCT 41.2 45.6  MCV 91.2 94.6  PLT 179 198   Basic Metabolic Panel: Recent Labs  Lab 12/17/17 0913 12/22/17 1344  NA 139 143  K 3.7 3.7  CL 103 108  CO2 25 24  GLUCOSE 89 100*  BUN 18 17  CREATININE 0.97 1.38*  CALCIUM 9.0 9.0   GFR: Estimated Creatinine Clearance: 69.1 mL/min (A) (by C-G formula based on SCr of 1.38 mg/dL (H)). Liver Function Tests: No results for  input(s): AST, ALT, ALKPHOS, BILITOT, PROT, ALBUMIN in the last 168 hours. No results for input(s): LIPASE, AMYLASE in the last 168 hours. No results for input(s): AMMONIA in the last 168 hours. Coagulation Profile: No results for input(s): INR, PROTIME in the last 168 hours. Cardiac Enzymes: Recent Labs  Lab 12/23/17 0540  TROPONINI <0.03   BNP (last 3 results) No results for input(s): PROBNP in the last 8760 hours. HbA1C: No results for input(s): HGBA1C in the last 72 hours. CBG: No results for input(s): GLUCAP in the last 168 hours. Lipid Profile: No results for input(s): CHOL, HDL, LDLCALC, TRIG, CHOLHDL, LDLDIRECT in the last 72 hours. Thyroid Function Tests: No results for input(s): TSH, T4TOTAL, FREET4, T3FREE, THYROIDAB in the last 72 hours. Anemia Panel: No results for input(s): VITAMINB12, FOLATE, FERRITIN, TIBC, IRON, RETICCTPCT in the last 72 hours. Sepsis Labs: No results for input(s): PROCALCITON, LATICACIDVEN in the last 168 hours.  No results found for this or any previous visit (from the past 240 hour(s)).     Radiology Studies: Ct Angio Chest Pe W/cm &/or Wo Cm  Result Date: 12/22/2017 CLINICAL DATA:  History of hypertension, now with shortness of breath. Evaluate for pulmonary embolism. EXAM: CT ANGIOGRAPHY CHEST WITH CONTRAST TECHNIQUE: Multidetector CT imaging of the chest was performed using the standard protocol during bolus administration of intravenous contrast. Multiplanar CT image reconstructions and MIPs were obtained to evaluate the vascular anatomy. CONTRAST:  ISOVUE-370 IOPAMIDOL (ISOVUE-370) INJECTION 76% COMPARISON:  Chest CT-12/17/2017 FINDINGS: Vascular Findings: There is adequate opacification of the pulmonary arterial system with the main pulmonary artery measuring 248 Hounsfield units. There are no discrete filling defects within the pulmonary arterial tree to the level of the bilateral subsegmental pulmonary arteries. Evaluation of the  distal subsegmental pulmonary arteries is degraded secondary to suboptimal vessel opacification as well as a combination of patient respiratory artifact and quantum mottle artifact due to patient body habitus. The main pulmonary artery is enlarged measuring 3.6 cm in diameter (image 39, series 5). Borderline cardiomegaly. Coronary artery calcifications. No pericardial effusion. No evidence of thoracic aortic aneurysm or dissection on this nongated examination. Atherosclerotic plaque within the aortic arch, not resulting in hemodynamically significant stenosis. Bovine configuration of the aortic arch. The branch vessels of the aortic arch appear widely patent throughout their imaged course. Review of the MIP images confirms the above findings. ---------------------------------------------------------------------------------- Nonvascular Findings: Mediastinum/Lymph Nodes: Grossly unchanged mediastinal and hilar lymphadenopathy with index AP window lymph node measuring approximately 1.4 cm in greatest short axis diameter (image 30, series 5, index right hilar lymph node measuring 1.3 cm (image 42, series 5) and index left hilar lymph nodes measuring approximately 1 cm and 1.7 cm in diameter respectively (images 46 and 40, series 5). Bilateral axillary lymph nodes are not enlarged by size criteria. Lungs/Pleura: Minimal dependent subpleural ground-glass atelectasis. No discrete focal airspace opacities. No pleural effusion or pneumothorax.  The central pulmonary airways appear patent. Unchanged punctate (approximately 0.5 cm) right upper lobe pulmonary nodules (images 30 and 38, series 7). Upper abdomen: Limited early arterial phase evaluation the upper abdomen is normal. Musculoskeletal: No acute or aggressive osseous abnormalities. Stigmata of DISH within the thoracic spine. A note is made of an approximately 1.9 x 1.6 cm hypoattenuating nodule within the right lobe of the thyroid (image 5, series 5). IMPRESSION: 1. No  acute cardiopulmonary disease. Specifically, no evidence of pulmonary embolism to the level the bilateral subsegmental pulmonary arteries. 2. Unchanged 5 mm right upper lobe pulmonary nodules. No follow-up needed if patient is low-risk (and has no known or suspected primary neoplasm). Non-contrast chest CT can be considered in 12 months if patient is high-risk. This recommendation follows the consensus statement: Guidelines for Management of Incidental Pulmonary Nodules Detected on CT Images: From the Fleischner Society 2017; Radiology 2017; 284:228-243. 3. Unchanged mediastinal and hilar lymphadenopathy, while potentially reactive in etiology, inflammatory and malignant etiologies are excluded on the basis of this examination. If not previously performed, referral to pulmonary medicine could be arranged as indicated. 4. Incidentally noted approximately 1.9 cm right-sided thyroid nodule. Further evaluation with dedicated nonemergent thyroid ultrasound could be performed as indicated. 5.  Aortic Atherosclerosis (ICD10-I70.0). Electronically Signed   By: Simonne ComeJohn  Watts M.D.   On: 12/22/2017 18:43   Dg Chest Portable 1 View  Result Date: 12/22/2017 CLINICAL DATA:  chief complaint of dyspnea that began around 12:45 PM. She reports a productive cough with yellow sputum. She is current, every day smoker. EXAM: PORTABLE CHEST 1 VIEW COMPARISON:  7/10/9 FINDINGS: Cardiac silhouette is normal in size. No mediastinal or hilar masses. Lungs are clear.  No pleural effusion or pneumothorax. Skeletal structures are grossly intact. IMPRESSION: No active disease. Electronically Signed   By: Amie Portlandavid  Ormond M.D.   On: 12/22/2017 14:11      Scheduled Meds: . azithromycin  500 mg Oral Q24H  . enoxaparin (LOVENOX) injection  60 mg Subcutaneous Q24H  . lisinopril  10 mg Oral Daily   And  . hydrochlorothiazide  12.5 mg Oral Daily  . ipratropium-albuterol  3 mL Nebulization Q6H  . methylPREDNISolone (SOLU-MEDROL) injection  60  mg Intravenous Q6H  . pneumococcal 23 valent vaccine  0.5 mL Intramuscular Tomorrow-1000  . sodium chloride flush  3 mL Intravenous Q12H   Continuous Infusions: . sodium chloride    . cefTRIAXone (ROCEPHIN)  IV 1 g (12/23/17 0537)     LOS: 1 day    Time spent: 35minutes   Noralee StainJennifer Sylvester Minton, DO [facility] [email or web address] Password [email or web address] 12/23/2017, 11:19 AM

## 2017-12-23 NOTE — Progress Notes (Signed)
LE venous duplex prelim: negative for DVT in visualized veins.  Anyra Kaufman Eunice, RDMS, RVT  

## 2017-12-24 DIAGNOSIS — R0902 Hypoxemia: Secondary | ICD-10-CM

## 2017-12-24 DIAGNOSIS — J40 Bronchitis, not specified as acute or chronic: Secondary | ICD-10-CM

## 2017-12-24 DIAGNOSIS — R0602 Shortness of breath: Secondary | ICD-10-CM

## 2017-12-24 LAB — BASIC METABOLIC PANEL
ANION GAP: 7 (ref 5–15)
BUN: 24 mg/dL — ABNORMAL HIGH (ref 6–20)
CALCIUM: 9.1 mg/dL (ref 8.9–10.3)
CHLORIDE: 109 mmol/L (ref 98–111)
CO2: 25 mmol/L (ref 22–32)
Creatinine, Ser: 0.91 mg/dL (ref 0.44–1.00)
GFR calc Af Amer: >60 mL/min (ref >60)
GFR calc non Af Amer: >60 mL/min (ref >60)
GLUCOSE: 115 mg/dL — AB (ref 70–99)
Potassium: 4.2 mmol/L (ref 3.5–5.1)
Sodium: 141 mmol/L (ref 135–145)

## 2017-12-24 LAB — CBC
HEMATOCRIT: 40.6 % (ref 36.0–46.0)
HEMOGLOBIN: 12.7 g/dL (ref 12.0–15.0)
MCH: 29.5 pg (ref 26.0–34.0)
MCHC: 31.3 g/dL (ref 30.0–36.0)
MCV: 94.2 fL (ref 78.0–100.0)
Platelets: 164 10*3/uL (ref 150–400)
RBC: 4.31 MIL/uL (ref 3.87–5.11)
RDW: 13.6 % (ref 11.5–15.5)
WBC: 25.2 10*3/uL — AB (ref 4.0–10.5)

## 2017-12-24 MED ORDER — LISINOPRIL 10 MG PO TABS
10.0000 mg | ORAL_TABLET | Freq: Every day | ORAL | Status: DC
  Administered 2017-12-24: 10 mg via ORAL
  Filled 2017-12-24: qty 1

## 2017-12-24 MED ORDER — AZITHROMYCIN 500 MG PO TABS: 500.0000 mg | ORAL_TABLET | ORAL | 0 refills | Status: DC

## 2017-12-24 MED ORDER — HYDRALAZINE HCL 20 MG/ML IJ SOLN
10.0000 mg | Freq: Once | INTRAMUSCULAR | Status: AC
  Administered 2017-12-24: 10 mg via INTRAVENOUS
  Filled 2017-12-24: qty 1

## 2017-12-24 MED ORDER — ALBUTEROL SULFATE (2.5 MG/3ML) 0.083% IN NEBU: 2.5000 mg | INHALATION_SOLUTION | RESPIRATORY_TRACT | 12 refills | Status: DC | PRN

## 2017-12-24 MED ORDER — PREDNISONE 10 MG PO TABS: ORAL_TABLET | ORAL | 0 refills | Status: DC

## 2017-12-24 MED ORDER — PANTOPRAZOLE SODIUM 20 MG PO TBEC: 20.0000 mg | DELAYED_RELEASE_TABLET | Freq: Every day | ORAL | 0 refills | Status: DC

## 2017-12-24 NOTE — Progress Notes (Signed)
Discharge instruction and prescriptions given.  Pt verbalized understanding. VSS. Denies pain.  Pt left floor via wheelchair accompanied by staff and family.

## 2017-12-24 NOTE — Progress Notes (Signed)
Dr. Blake DivineAkula on floor, V/O ok to discharge patient home.

## 2017-12-24 NOTE — Progress Notes (Signed)
Pt BP last three, 195/102, 155, and 130's.  Pt stated has not had bp meds since admit.  Paged Dr. Tally DueAkula FYI.

## 2017-12-24 NOTE — Care Management Note (Signed)
Case Management Note  Patient Details  Name: Sharon Dyer MRN: [id number] Date of Birth: Oct 27, 1965  Subjective/Objective:                    Action/Plan:  [facility] which will be delivered to patient's hospital room before discharge. Patient aware.   Patient does not have a PCP , she uses Urgent Care when she needs " something".   Explained she can call number on her insurance card and be provided with a list of PCP's in network with her insurance.   Patient states that some friends have recommended [facility] and [facility] , so she is going to call them to see if they take her insurance and taking new patients at this time.   Patient voiced understanding to all of the above. Expected Discharge Date:  12/24/17               Expected Discharge Plan:  Home/Self Care  In-House Referral:  PCP / [facility]  Discharge planning Services  CM Consult  Post Acute Care Choice:  Durable Medical Equipment Choice offered to:  Patient  DME Arranged:  Nebulizer/meds DME Agency:  [facility]  HH Arranged:  NA HH Agency:  NA  Status of Service:  Completed, signed off  If discussed at Long Length of Stay Meetings, dates discussed:    Additional Comments:  Kingsley PlanWile, Nykia Turko Marie, RN 12/24/2017, 8:27 AM

## 2017-12-24 NOTE — Discharge Summary (Addendum)
Physician Discharge Summary  Sharon Dyer ZOX:[id number] DOB: 1966-03-30 DOA: 12/22/2017  PCP: Patient, No Pcp Per  Admit date: 12/22/2017 Discharge date: 12/24/2017  Admitted From: Home.  Disposition:  HOme.   Recommendations for Outpatient Follow-up:  1. Follow up with PCP in 1-2 weeks 2. Please obtain BMP/CBC in one week Please follow up with pulmonary clinic in 1 to 2 weeks for follow up of the pulmonary nodules and pulmonary hypertension.  Please follow up with PCP regarding the thyroid nodule.   Discharge Condition: stable.  CODE STATUS:full code.  Diet recommendation: Heart Healthy   Brief/Interim Summary:  ReneeBowersis a51 y.o.female,past medical history significant for hypertension, chronic back pain and bilateral ankle pain on Motrin daily, presenting with few days history of cough, productive of whitish phlegm with increasing shortness of breath. CT of chest came back unremarkable. Patient was noted to be hypoxemic in the emergency room and she was started on 10 L/min by nasal cannula and taper down to 3 L later. Patient received steroids in the emergency room with neb treatments. Patient was in the emergency room 2 days ago for chest pain; was discharged home after normal work up and instructed to follow up with cardiology for possible stress test.  On the day of discharge, she was weaned off oxygen, she denies any sob or chest pain and wanted to go home.  Dicussed the results of the echocardiogram.   Discharge Diagnoses:  Active Problems:   Hypoxemia  Acute hypoxemic respiratory failure DIFFERENTIAL include bronchitis vs copd exacerbation vs pulmonary hypertension,  respiratory PCR panel negative. Differential include pulmonary hypertension.  -CTA chest negative for pulmonary embolism -Continue azithromycin, wean solumedrol to prednisone - discharge home on steroid taper and azithromycin.  - outpatient follow up with pulmonary clinic for management of pulmonary  hypertension and evaluation of sleep apnea.   Chest pain -No complaints of CP today -Trop negative x3 -Echocardiogram shows mod pulm hypertension.   Essential hypertension Resume home meds on discharge.  AKI on CKD stage 3 -Baseline Cr 0.9   5 mm right upper lobe pulmonary nodules -Follow up outpatient  Incidental 1.9 cm right-sided thyroid nodule -Follow up outpatient     Discharge Instructions  Discharge Instructions    Diet - low sodium heart healthy   Complete by:  As directed    Discharge instructions   Complete by:  As directed    Please follow up with PCP in one week.  Please follow up with pulmonary clinic as recommended in 1 to 2 weeks. Please follow up with PCP regarding the thyroid nodule.     Allergies as of 12/24/2017   No Known Allergies     Medication List    STOP taking these medications   ibuprofen 200 MG tablet Commonly known as:  ADVIL,MOTRIN   traMADol 50 MG tablet Commonly known as:  ULTRAM     TAKE these medications   albuterol (2.5 MG/3ML) 0.083% nebulizer solution Commonly known as:  PROVENTIL Take 3 mLs (2.5 mg total) by nebulization every 2 (two) hours as needed for wheezing.   azithromycin 500 MG tablet Commonly known as:  ZITHROMAX Take 1 tablet (500 mg total) by mouth daily. Start taking on:  12/25/2017   hydrOXYzine 25 MG tablet Commonly known as:  ATARAX/VISTARIL Take 1 tablet (25 mg total) every 6 (six) hours as needed by mouth for anxiety.   lisinopril-hydrochlorothiazide 10-12.5 MG tablet Commonly known as:  ZESTORETIC Take 1 tablet daily by mouth.   pantoprazole 20  MG tablet Commonly known as:  PROTONIX Take 1 tablet (20 mg total) by mouth daily.   predniSONE 10 MG tablet Commonly known as:  DELTASONE Prednisone 50 mg daily for 2 days followed by  Prednisone 40 mg daily for 3 days followed by  Prednisone 20 mg daily for 3 days            Durable Medical Equipment  (From admission, onward)         Start     Ordered   12/24/17 0800  For home use only DME Nebulizer machine  Once    Question:  Patient needs a nebulizer to treat with the following condition  Answer:  COPD exacerbation (HCC)   12/24/17 0759     Follow-up Information    [facility]. Schedule an appointment as soon as possible for a visit in 1 week(s).   Specialty:  Pulmonology Why:  for pulmonary hypertension Contact information: [address] [place] [address] [phone]         No Known Allergies  Consultations:  None.    Procedures/Studies: Dg Chest 2 View  Result Date: 12/17/2017 CLINICAL DATA:  Chest pain EXAM: CHEST - 2 VIEW COMPARISON:  April 13, 2017. FINDINGS: There is no edema or consolidation. The heart size and pulmonary vascularity are normal. No adenopathy. There is degenerative change in the thoracic spine. IMPRESSION: No edema or consolidation. Electronically Signed   By: Bretta Bang III M.D.   On: 12/17/2017 09:34   Ct Angio Chest Pe W/cm &/or Wo Cm  Result Date: 12/22/2017 CLINICAL DATA:  History of hypertension, now with shortness of breath. Evaluate for pulmonary embolism. EXAM: CT ANGIOGRAPHY CHEST WITH CONTRAST TECHNIQUE: Multidetector CT imaging of the chest was performed using the standard protocol during bolus administration of intravenous contrast. Multiplanar CT image reconstructions and MIPs were obtained to evaluate the vascular anatomy. CONTRAST:  ISOVUE-370 IOPAMIDOL (ISOVUE-370) INJECTION 76% COMPARISON:  Chest CT-12/17/2017 FINDINGS: Vascular Findings: There is adequate opacification of the pulmonary arterial system with the main pulmonary artery measuring 248 Hounsfield units. There are no discrete filling defects within the pulmonary arterial tree to the level of the bilateral subsegmental pulmonary arteries. Evaluation of the distal subsegmental pulmonary arteries is degraded secondary to suboptimal vessel opacification as well as a  combination of patient respiratory artifact and quantum mottle artifact due to patient body habitus. The main pulmonary artery is enlarged measuring 3.6 cm in diameter (image 39, series 5). Borderline cardiomegaly. Coronary artery calcifications. No pericardial effusion. No evidence of thoracic aortic aneurysm or dissection on this nongated examination. Atherosclerotic plaque within the aortic arch, not resulting in hemodynamically significant stenosis. Bovine configuration of the aortic arch. The branch vessels of the aortic arch appear widely patent throughout their imaged course. Review of the MIP images confirms the above findings. ---------------------------------------------------------------------------------- Nonvascular Findings: Mediastinum/Lymph Nodes: Grossly unchanged mediastinal and hilar lymphadenopathy with index AP window lymph node measuring approximately 1.4 cm in greatest short axis diameter (image 30, series 5, index right hilar lymph node measuring 1.3 cm (image 42, series 5) and index left hilar lymph nodes measuring approximately 1 cm and 1.7 cm in diameter respectively (images 46 and 40, series 5). Bilateral axillary lymph nodes are not enlarged by size criteria. Lungs/Pleura: Minimal dependent subpleural ground-glass atelectasis. No discrete focal airspace opacities. No pleural effusion or pneumothorax. The central pulmonary airways appear patent. Unchanged punctate (approximately 0.5 cm) right upper lobe pulmonary nodules (images 30 and 38, series 7). Upper  abdomen: Limited early arterial phase evaluation the upper abdomen is normal. Musculoskeletal: No acute or aggressive osseous abnormalities. Stigmata of DISH within the thoracic spine. A note is made of an approximately 1.9 x 1.6 cm hypoattenuating nodule within the right lobe of the thyroid (image 5, series 5). IMPRESSION: 1. No acute cardiopulmonary disease. Specifically, no evidence of pulmonary embolism to the level the bilateral  subsegmental pulmonary arteries. 2. Unchanged 5 mm right upper lobe pulmonary nodules. No follow-up needed if patient is low-risk (and has no known or suspected primary neoplasm). Non-contrast chest CT can be considered in 12 months if patient is high-risk. This recommendation follows the consensus statement: Guidelines for Management of Incidental Pulmonary Nodules Detected on CT Images: From the Fleischner Society 2017; Radiology 2017; 284:228-243. 3. Unchanged mediastinal and hilar lymphadenopathy, while potentially reactive in etiology, inflammatory and malignant etiologies are excluded on the basis of this examination. If not previously performed, referral to pulmonary medicine could be arranged as indicated. 4. Incidentally noted approximately 1.9 cm right-sided thyroid nodule. Further evaluation with dedicated nonemergent thyroid ultrasound could be performed as indicated. 5.  Aortic Atherosclerosis (ICD10-I70.0). Electronically Signed   By: Simonne Come M.D.   On: 12/22/2017 18:43   Ct Angio Chest Pe W And/or Wo Contrast  Result Date: 12/17/2017 CLINICAL DATA:  Chest pain particularly left-sided radiating to the left arm, some nausea and sweating EXAM: CT ANGIOGRAPHY CHEST WITH CONTRAST TECHNIQUE: Multidetector CT imaging of the chest was performed using the standard protocol during bolus administration of intravenous contrast. Multiplanar CT image reconstructions and MIPs were obtained to evaluate the vascular anatomy. CONTRAST:  ISOVUE-370 IOPAMIDOL (ISOVUE-370) INJECTION 76% COMPARISON:  Chest x-ray of 12/17/2016 FINDINGS: Cardiovascular: The pulmonary arteries are moderately well opacified and no evidence of pulmonary embolism is seen. The peripheral branches to the lower lobes are less well evaluated due to motion obscuring detail. However no definite central embolus is seen. The thoracic aorta is not as well opacified but no acute abnormality is evident. The heart is mildly enlarged and there  are coronary artery calcifications primarily in the distribution of the left anterior descending coronary artery. Mediastinum/Nodes: No mediastinal or hilar adenopathy is noted with small mediastinal lymph nodes present diffusely. One of the larger prevascular anterior mediastinal nodes on image 109 series 7 measures 13 mm. The thyroid gland is unremarkable. Lungs/Pleura: On lung window images mild paraseptal emphysema is noted. A 5 mm noncalcified nodule is present within the apex of the right upper lobe on image 44 series 6. A 6 mm nodule is noted abutting the minor fissure within the anterior inferior right upper lobe possibly representing a perifissural lymph node. No nodules are present throughout the left lung. No pleural effusion is seen. Upper Abdomen: The portion of the upper abdomen that is visualized is unremarkable. Musculoskeletal: There are degenerative changes diffusely throughout the thoracic spine. No compression deformity is seen. Review of the MIP images confirms the above findings. IMPRESSION: 1. No evidence of acute pulmonary embolism. Motion does obscure pulmonary artery branches to the to the lower lobes but no embolus is evident. 2. No pneumonia or pleural effusion. 3. Two noncalcified lung nodules on the right, one of which appears to represent a perifissural lymph node. No follow-up needed if patient is low-risk. Non-contrast chest CT can be considered in 12 months if patient is high-risk. This recommendation follows the consensus statement: Guidelines for Management of Incidental Pulmonary Nodules Detected on CT Images: From the Fleischner Society 2017; Radiology 2017; 284:228-243.  Electronically Signed   By: Dwyane DeePaul  Barry M.D.   On: 12/17/2017 14:06   Dg Chest Portable 1 View  Result Date: 12/22/2017 CLINICAL DATA:  chief complaint of dyspnea that began around 12:45 PM. She reports a productive cough with yellow sputum. She is current, every day smoker. EXAM: PORTABLE CHEST 1 VIEW  COMPARISON:  7/10/9 FINDINGS: Cardiac silhouette is normal in size. No mediastinal or hilar masses. Lungs are clear.  No pleural effusion or pneumothorax. Skeletal structures are grossly intact. IMPRESSION: No active disease. Electronically Signed   By: Amie Portlandavid  Ormond M.D.   On: 12/22/2017 14:11    Echocardiogram.    Subjective: Breathing better, no chest pain, no sob, no nausea, vomiting, headache or dizziness.   Discharge Exam: Vitals:   12/23/17 2056 12/24/17 0512  BP: (!) 151/69 (!) 150/93  Pulse: 83 80  Resp: 18 18  Temp: (!) 97.4 F (36.3 C) (!) 97.5 F (36.4 C)  SpO2: 92% 95%   Vitals:   12/23/17 1715 12/23/17 2031 12/23/17 2056 12/24/17 0512  BP: (!) 159/67  (!) 151/69 (!) 150/93  Pulse: 83  83 80  Resp: 20  18 18   Temp: 98.4 F (36.9 C)  (!) 97.4 F (36.3 C) (!) 97.5 F (36.4 C)  TempSrc: Oral  Oral Oral  SpO2:  98% 92% 95%  Weight: 136.1 kg (300 lb)     Height: 5\' 8"  (1.727 m)       General: Pt is alert, awake, not in acute distress Cardiovascular: RRR, S1/S2 +, no rubs, no gallops Respiratory: CTA bilaterally, no wheezing, no rhonchi Abdominal: Soft, NT, ND, bowel sounds + Extremities: no edema, no cyanosis    The results of significant diagnostics from this hospitalization (including imaging, microbiology, ancillary and laboratory) are listed below for reference.     Microbiology: Recent Results (from the past 240 hour(s))  Respiratory Panel by PCR     Status: None   Collection Time: 12/23/17  7:31 AM  Result Value Ref Range Status   Adenovirus NOT DETECTED NOT DETECTED Final   Coronavirus 229E NOT DETECTED NOT DETECTED Final   Coronavirus HKU1 NOT DETECTED NOT DETECTED Final   Coronavirus NL63 NOT DETECTED NOT DETECTED Final   Coronavirus OC43 NOT DETECTED NOT DETECTED Final   Metapneumovirus NOT DETECTED NOT DETECTED Final   Rhinovirus / Enterovirus NOT DETECTED NOT DETECTED Final   Influenza A NOT DETECTED NOT DETECTED Final   Influenza B NOT  DETECTED NOT DETECTED Final   Parainfluenza Virus 1 NOT DETECTED NOT DETECTED Final   Parainfluenza Virus 2 NOT DETECTED NOT DETECTED Final   Parainfluenza Virus 3 NOT DETECTED NOT DETECTED Final   Parainfluenza Virus 4 NOT DETECTED NOT DETECTED Final   Respiratory Syncytial Virus NOT DETECTED NOT DETECTED Final   Bordetella pertussis NOT DETECTED NOT DETECTED Final   Chlamydophila pneumoniae NOT DETECTED NOT DETECTED Final   Mycoplasma pneumoniae NOT DETECTED NOT DETECTED Final    Comment: Performed at [facility], [address] [place], [place] [id number]     Labs: BNP (last 3 results) Recent Labs    12/22/17 1344  BNP 100.7*   Basic Metabolic Panel: Recent Labs  Lab 12/17/17 0913 12/22/17 1344 12/24/17 0607  NA 139 143 141  K 3.7 3.7 4.2  CL 103 108 109  CO2 25 24 25   GLUCOSE 89 100* 115*  BUN 18 17 24*  CREATININE 0.97 1.38* 0.91  CALCIUM 9.0 9.0 9.1   Liver Function Tests: No  results for input(s): AST, ALT, ALKPHOS, BILITOT, PROT, ALBUMIN in the last 168 hours. No results for input(s): LIPASE, AMYLASE in the last 168 hours. No results for input(s): AMMONIA in the last 168 hours. CBC: Recent Labs  Lab 12/17/17 0913 12/22/17 1344 12/24/17 0607  WBC 8.7 8.8 25.2*  HGB 13.3 14.0 12.7  HCT 41.2 45.6 40.6  MCV 91.2 94.6 94.2  PLT 179 198 164   Cardiac Enzymes: Recent Labs  Lab 12/23/17 0540 12/23/17 1039  TROPONINI <0.03 <0.03   BNP: Invalid input(s): POCBNP CBG: No results for input(s): GLUCAP in the last 168 hours. D-Dimer No results for input(s): DDIMER in the last 72 hours. Hgb A1c No results for input(s): HGBA1C in the last 72 hours. Lipid Profile No results for input(s): CHOL, HDL, LDLCALC, TRIG, CHOLHDL, LDLDIRECT in the last 72 hours. Thyroid function studies No results for input(s): TSH, T4TOTAL, T3FREE, THYROIDAB in the last 72 hours.  Invalid input(s): FREET3 Anemia work up No results for input(s): VITAMINB12, FOLATE, FERRITIN,  TIBC, IRON, RETICCTPCT in the last 72 hours. Urinalysis    Component Value Date/Time   COLORURINE YELLOW 03/26/2015 2352   APPEARANCEUR CLOUDY (A) 03/26/2015 2352   LABSPEC 1.019 03/26/2015 2352   PHURINE 5.5 03/26/2015 2352   GLUCOSEU NEGATIVE 03/26/2015 2352   HGBUR LARGE (A) 03/26/2015 2352   BILIRUBINUR NEGATIVE 03/26/2015 2352   KETONESUR NEGATIVE 03/26/2015 2352   PROTEINUR 30 (A) 03/26/2015 2352   UROBILINOGEN 1.0 03/26/2015 2352   NITRITE NEGATIVE 03/26/2015 2352   LEUKOCYTESUR SMALL (A) 03/26/2015 2352   Sepsis Labs Invalid input(s): PROCALCITONIN,  WBC,  LACTICIDVEN Microbiology Recent Results (from the past 240 hour(s))  Respiratory Panel by PCR     Status: None   Collection Time: 12/23/17  7:31 AM  Result Value Ref Range Status   Adenovirus NOT DETECTED NOT DETECTED Final   Coronavirus 229E NOT DETECTED NOT DETECTED Final   Coronavirus HKU1 NOT DETECTED NOT DETECTED Final   Coronavirus NL63 NOT DETECTED NOT DETECTED Final   Coronavirus OC43 NOT DETECTED NOT DETECTED Final   Metapneumovirus NOT DETECTED NOT DETECTED Final   Rhinovirus / Enterovirus NOT DETECTED NOT DETECTED Final   Influenza A NOT DETECTED NOT DETECTED Final   Influenza B NOT DETECTED NOT DETECTED Final   Parainfluenza Virus 1 NOT DETECTED NOT DETECTED Final   Parainfluenza Virus 2 NOT DETECTED NOT DETECTED Final   Parainfluenza Virus 3 NOT DETECTED NOT DETECTED Final   Parainfluenza Virus 4 NOT DETECTED NOT DETECTED Final   Respiratory Syncytial Virus NOT DETECTED NOT DETECTED Final   Bordetella pertussis NOT DETECTED NOT DETECTED Final   Chlamydophila pneumoniae NOT DETECTED NOT DETECTED Final   Mycoplasma pneumoniae NOT DETECTED NOT DETECTED Final    Comment: Performed at [facility], [address] [place], [place] [address]     Time coordinating discharge: 32 minutes  SIGNED:   Kathlen Mody, MD  [facility] 12/24/2017, 8:04 AM Pager   If 7PM-7AM, please contact  night-coverage [email or web address] Password [email or web address]

## 2017-12-24 NOTE — Progress Notes (Signed)
Paged Dr. Levin ErpAkukla does she still want to dc home.

## 2017-12-28 LAB — CULTURE, BLOOD (ROUTINE X 2)
CULTURE: NO GROWTH
Culture: NO GROWTH

## 2018-01-29 ENCOUNTER — Encounter (HOSPITAL_COMMUNITY): Payer: Self-pay | Admitting: Emergency Medicine

## 2018-01-29 ENCOUNTER — Other Ambulatory Visit: Payer: Self-pay

## 2018-01-29 ENCOUNTER — Emergency Department (HOSPITAL_COMMUNITY): Payer: Managed Care, Other (non HMO)

## 2018-01-29 ENCOUNTER — Observation Stay (HOSPITAL_COMMUNITY)
Admission: EM | Admit: 2018-01-29 | Discharge: 2018-01-30 | Disposition: A | Discharge status: Home / Self Care | Payer: Managed Care, Other (non HMO) | Attending: Internal Medicine | Admitting: Internal Medicine

## 2018-01-29 DIAGNOSIS — M549 Dorsalgia, unspecified: Secondary | ICD-10-CM | POA: Insufficient documentation

## 2018-01-29 DIAGNOSIS — Z79899 Other long term (current) drug therapy: Secondary | ICD-10-CM | POA: Insufficient documentation

## 2018-01-29 DIAGNOSIS — Z6841 Body Mass Index (BMI) 40.0 and over, adult: Secondary | ICD-10-CM | POA: Insufficient documentation

## 2018-01-29 DIAGNOSIS — M25572 Pain in left ankle and joints of left foot: Secondary | ICD-10-CM | POA: Insufficient documentation

## 2018-01-29 DIAGNOSIS — J9801 Acute bronchospasm: Secondary | ICD-10-CM

## 2018-01-29 DIAGNOSIS — I10 Essential (primary) hypertension: Secondary | ICD-10-CM | POA: Insufficient documentation

## 2018-01-29 DIAGNOSIS — G8929 Other chronic pain: Secondary | ICD-10-CM | POA: Insufficient documentation

## 2018-01-29 DIAGNOSIS — I700 Atherosclerosis of aorta: Secondary | ICD-10-CM | POA: Insufficient documentation

## 2018-01-29 DIAGNOSIS — Z720 Tobacco use: Secondary | ICD-10-CM | POA: Diagnosis present

## 2018-01-29 DIAGNOSIS — E876 Hypokalemia: Secondary | ICD-10-CM | POA: Insufficient documentation

## 2018-01-29 DIAGNOSIS — F17210 Nicotine dependence, cigarettes, uncomplicated: Secondary | ICD-10-CM | POA: Insufficient documentation

## 2018-01-29 DIAGNOSIS — N179 Acute kidney failure, unspecified: Principal | ICD-10-CM | POA: Insufficient documentation

## 2018-01-29 DIAGNOSIS — R0603 Acute respiratory distress: Secondary | ICD-10-CM | POA: Insufficient documentation

## 2018-01-29 DIAGNOSIS — J9600 Acute respiratory failure, unspecified whether with hypoxia or hypercapnia: Secondary | ICD-10-CM

## 2018-01-29 DIAGNOSIS — F419 Anxiety disorder, unspecified: Secondary | ICD-10-CM | POA: Insufficient documentation

## 2018-01-29 DIAGNOSIS — E669 Obesity, unspecified: Secondary | ICD-10-CM | POA: Insufficient documentation

## 2018-01-29 HISTORY — DX: Dyspnea, unspecified: R06.00

## 2018-01-29 HISTORY — DX: Unspecified osteoarthritis, unspecified site: M19.90

## 2018-01-29 LAB — CBC
HCT: 44.4 % (ref 36.0–46.0)
HEMATOCRIT: 45.2 % (ref 36.0–46.0)
Hemoglobin: 14.5 g/dL (ref 12.0–15.0)
Hemoglobin: 13.7 g/dL (ref 12.0–15.0)
MCH: 29.8 pg (ref 26.0–34.0)
MCH: 28.8 pg (ref 26.0–34.0)
MCHC: 32.1 g/dL (ref 30.0–36.0)
MCHC: 30.9 g/dL (ref 30.0–36.0)
MCV: 92.8 fL (ref 78.0–100.0)
MCV: 93.5 fL (ref 78.0–100.0)
Platelets: 194 10*3/uL (ref 150–400)
Platelets: 190 10*3/uL (ref 150–400)
RBC: 4.87 MIL/uL (ref 3.87–5.11)
RBC: 4.75 MIL/uL (ref 3.87–5.11)
RDW: 12.8 % (ref 11.5–15.5)
RDW: 12.7 % (ref 11.5–15.5)
WBC: 15.8 10*3/uL — ABNORMAL HIGH (ref 4.0–10.5)
WBC: 15.9 10*3/uL — ABNORMAL HIGH (ref 4.0–10.5)

## 2018-01-29 LAB — BASIC METABOLIC PANEL
Anion gap: 10 (ref 5–15)
BUN: 29 mg/dL — AB (ref 6–20)
CALCIUM: 8.9 mg/dL (ref 8.9–10.3)
CO2: 30 mmol/L (ref 22–32)
Chloride: 99 mmol/L (ref 98–111)
Creatinine, Ser: 2.04 mg/dL — ABNORMAL HIGH (ref 0.44–1.00)
GFR calc Af Amer: 31 mL/min — ABNORMAL LOW (ref >60)
GFR, EST NON AFRICAN AMERICAN: 26 mL/min — AB (ref >60)
GLUCOSE: 135 mg/dL — AB (ref 70–99)
Potassium: 2.8 mmol/L — ABNORMAL LOW (ref 3.5–5.1)
Sodium: 139 mmol/L (ref 135–145)

## 2018-01-29 LAB — CREATININE, SERUM
CREATININE: 2.21 mg/dL — AB (ref 0.44–1.00)
GFR calc Af Amer: 28 mL/min — ABNORMAL LOW (ref >60)
GFR calc non Af Amer: 24 mL/min — ABNORMAL LOW (ref >60)

## 2018-01-29 LAB — MAGNESIUM: Magnesium: 1.6 mg/dL — ABNORMAL LOW (ref 1.7–2.4)

## 2018-01-29 MED ORDER — HEPARIN SODIUM (PORCINE) 5000 UNIT/ML IJ SOLN
5000.0000 [IU] | Freq: Three times a day (TID) | INTRAMUSCULAR | Status: DC
  Administered 2018-01-29 – 2018-01-30 (×2): 5000 [IU] via SUBCUTANEOUS
  Filled 2018-01-29 (×2): qty 1

## 2018-01-29 MED ORDER — POTASSIUM CHLORIDE CRYS ER 20 MEQ PO TBCR
40.0000 meq | EXTENDED_RELEASE_TABLET | Freq: Once | ORAL | Status: AC
  Administered 2018-01-29: 40 meq via ORAL
  Filled 2018-01-29: qty 2

## 2018-01-29 MED ORDER — ALBUTEROL (5 MG/ML) CONTINUOUS INHALATION SOLN
10.0000 mg/h | INHALATION_SOLUTION | Freq: Once | RESPIRATORY_TRACT | Status: AC
  Administered 2018-01-29: 10 mg/h via RESPIRATORY_TRACT
  Filled 2018-01-29: qty 20

## 2018-01-29 MED ORDER — SENNOSIDES-DOCUSATE SODIUM 8.6-50 MG PO TABS: 1.0000 | ORAL_TABLET | Freq: Every evening | ORAL | Status: DC | PRN

## 2018-01-29 MED ORDER — ACETAMINOPHEN 325 MG PO TABS: 650.0000 mg | ORAL_TABLET | Freq: Four times a day (QID) | ORAL | Status: DC | PRN

## 2018-01-29 MED ORDER — ALBUTEROL SULFATE (2.5 MG/3ML) 0.083% IN NEBU: 2.5000 mg | INHALATION_SOLUTION | RESPIRATORY_TRACT | Status: DC | PRN

## 2018-01-29 MED ORDER — ONDANSETRON HCL 4 MG/2ML IJ SOLN
4.0000 mg | Freq: Four times a day (QID) | INTRAMUSCULAR | Status: DC | PRN
  Administered 2018-01-30: 4 mg via INTRAVENOUS
  Filled 2018-01-29: qty 2

## 2018-01-29 MED ORDER — PREDNISONE 20 MG PO TABS
40.0000 mg | ORAL_TABLET | Freq: Every day | ORAL | Status: DC
  Administered 2018-01-30: 40 mg via ORAL
  Filled 2018-01-29: qty 2

## 2018-01-29 MED ORDER — ONDANSETRON HCL 4 MG PO TABS: 4.0000 mg | ORAL_TABLET | Freq: Four times a day (QID) | ORAL | Status: DC | PRN

## 2018-01-29 MED ORDER — SODIUM CHLORIDE 0.9 % IV BOLUS
1000.0000 mL | Freq: Once | INTRAVENOUS | Status: AC
  Administered 2018-01-29: 1000 mL via INTRAVENOUS

## 2018-01-29 MED ORDER — ACETAMINOPHEN 650 MG RE SUPP: 650.0000 mg | Freq: Four times a day (QID) | RECTAL | Status: DC | PRN

## 2018-01-29 MED ORDER — POTASSIUM CHLORIDE 20 MEQ PO PACK
40.0000 meq | PACK | ORAL | Status: AC
  Filled 2018-01-29 (×2): qty 2

## 2018-01-29 MED ORDER — ALBUTEROL SULFATE (2.5 MG/3ML) 0.083% IN NEBU
5.0000 mg | INHALATION_SOLUTION | Freq: Once | RESPIRATORY_TRACT | Status: AC
  Administered 2018-01-29: 5 mg via RESPIRATORY_TRACT
  Filled 2018-01-29: qty 6

## 2018-01-29 MED ORDER — MAGNESIUM SULFATE 2 GM/50ML IV SOLN
2.0000 g | Freq: Once | INTRAVENOUS | Status: DC
  Filled 2018-01-29: qty 50

## 2018-01-29 MED ORDER — POTASSIUM CHLORIDE 10 MEQ/100ML IV SOLN
10.0000 meq | Freq: Once | INTRAVENOUS | Status: AC
  Administered 2018-01-29: 10 meq via INTRAVENOUS
  Filled 2018-01-29: qty 100

## 2018-01-29 MED ORDER — IPRATROPIUM BROMIDE 0.02 % IN SOLN
0.5000 mg | Freq: Once | RESPIRATORY_TRACT | Status: AC
  Administered 2018-01-29: 0.5 mg via RESPIRATORY_TRACT
  Filled 2018-01-29: qty 2.5

## 2018-01-29 MED ORDER — BUPROPION HCL ER (XL) 150 MG PO TB24
150.0000 mg | ORAL_TABLET | Freq: Every day | ORAL | Status: DC
  Administered 2018-01-30: 150 mg via ORAL
  Filled 2018-01-29: qty 1

## 2018-01-29 NOTE — H&P (Addendum)
History and Physical  LANEE CHAIN VQQ:[id number] DOB: [birth year] DOA: 01/29/2018  Referring physician: Jodi Geralds, PA Patient coming from:Home At her baseline ambulates independently  Chief Complaint: Increased fatigue and difficulty breathing  HPI: Sharon Dyer is a 52 y.o. female with medical history significant for HTN, chronic back pain and bilateral ankle pain, current tobacco use who presents on 01/29/2018 with 2 days of worsening fatigue and shortness of breath.  She was last admitted on 12/22/2017-12/24/2017 for shortness of breath requiring up to 10 L of oxygen during hospital stay for presumed COPD flare (undiagnosed) versus pulmonary hypertension.  Patient states she has been doing well since then but not requiring any supplemental oxygen.  She was recently prescribed albuterol nebulizers by her PCP to use as needed.  She states a few days prior to this admission she noticed that she was getting more easily tired most noticeably having to sit down while cooking.  She went up the stairs to her home and became more short of breath prompting her spouse to call EMS.  She reports her breathing improved with a nebulizer treatment with her home machine but still wanted to go to the ED in case it happened again.  She denied any preceding chest pain, palpitations.  Denies any lower leg swelling, no PND orthopnea (sleeps on 2 pillows for comfort), no recent sick contacts, no fevers or chills, no change in chronic cough.   ED Course: She received nebulized albuterol and Atrovent as well as Solu-Medrol EMS.  Initially on arrival to ED symptoms had greatly improved however had another acute episode with increased work of breathing associated with shortness of breath witnessed by providers.  Chest x-ray showed mild interstitial prominence that may reflect component of bronchiti change.  She did not require any supplemental oxygen and maintain normal oxygen saturation on room air.  Hemodynamically she  remained stable.  Lab work was notable for hypokalemia of 2.8 and creatinine of 2.04, WBC 15.8.  She was even given albuterol nebs and Atrovent as well as 1 IV 10 mEq of potassium and admitted to [facility]  Review of Systems:As mentioned in the history of present illness.Review of systems are otherwise negative Patient seen in the ED .   Past Medical History:  Diagnosis Date  . Anxiety   . Hypertension   . Obesity    Past Surgical History:  Procedure Laterality Date  . ECTOPIC PREGNANCY SURGERY  X 2   No Known Allergies Social History:  reports that she has been smoking cigarettes. She has a 37.00 pack-year smoking history. She has never used smokeless tobacco. She reports that she drinks alcohol. She reports that she does not use drugs. Family History  Problem Relation Age of Onset  . Hypertension Mother       Prior to Admission medications   Medication Sig Start Date End Date Taking? Authorizing Provider  albuterol (PROVENTIL) (2.5 MG/3ML) 0.083% nebulizer solution Take 3 mLs (2.5 mg total) by nebulization every 2 (two) hours as needed for wheezing. 12/24/17   Kathlen Mody, MD  azithromycin (ZITHROMAX) 500 MG tablet Take 1 tablet (500 mg total) by mouth daily. 12/25/17   Kathlen Mody, MD  hydrOXYzine (ATARAX/VISTARIL) 25 MG tablet Take 1 tablet (25 mg total) every 6 (six) hours as needed by mouth for anxiety. Patient not taking: Reported on 12/23/2017 04/13/17   Antony Madura, PA-C  lisinopril-hydrochlorothiazide (ZESTORETIC) 10-12.5 MG tablet Take 1 tablet daily by mouth. 04/13/17   Antony Madura, PA-C  pantoprazole (PROTONIX) 20 MG tablet Take 1 tablet (20 mg total) by mouth daily. 12/24/17   Kathlen ModyAkula, Vijaya, MD  predniSONE (DELTASONE) 10 MG tablet Prednisone 50 mg daily for 2 days followed by  Prednisone 40 mg daily for 3 days followed by  Prednisone 20 mg daily for 3 days 12/24/17   Kathlen ModyAkula, Vijaya, MD    Physical Exam: BP (!) 144/69   Pulse (!) 105   Resp (!) 24    Ht 5\' 8"  (1.727 m)   Wt 127.5 kg   LMP 01/12/2015   SpO2 94%   BMI 42.73 kg/m   Constitutional obese female, no acute distress Eyes: EOMI, anicteric, normal conjunctivae ENMT: Oropharynx with moist mucous membranes, normal dentition Cardiovascular: Regular rhythm, tachycardic no MRGs, with no peripheral edema Respiratory: Normal respiratory effort on room air, no appreciable crackles or wheezing, able to speak in complete sentences, no signs of respiratory distress Abdomen: Soft,non-tender,  Skin: No rash ulcers, or lesions. Without skin tenting  Neurologic: Grossly no focal neuro deficit. Psychiatric:Appropriate affect, and mood. Mental status AAOx3          Labs on Admission:  Basic Metabolic Panel: Recent Labs  Lab 01/29/18 1319  NA 139  K 2.8*  CL 99  CO2 30  GLUCOSE 135*  BUN 29*  CREATININE 2.04*  CALCIUM 8.9   Liver Function Tests: No results for input(s): AST, ALT, ALKPHOS, BILITOT, PROT, ALBUMIN in the last 168 hours. No results for input(s): LIPASE, AMYLASE in the last 168 hours. No results for input(s): AMMONIA in the last 168 hours. CBC: Recent Labs  Lab 01/29/18 1319  WBC 15.8*  HGB 14.5  HCT 45.2  MCV 92.8  PLT 194   Cardiac Enzymes: No results for input(s): CKTOTAL, CKMB, CKMBINDEX, TROPONINI in the last 168 hours.  BNP (last 3 results) Recent Labs    12/22/17 1344  BNP 100.7*    ProBNP (last 3 results) No results for input(s): PROBNP in the last 8760 hours.  CBG: No results for input(s): GLUCAP in the last 168 hours.  Radiological Exams on Admission: Dg Chest 2 View  Result Date: 01/29/2018 CLINICAL DATA:  Dyspnea EXAM: CHEST - 2 VIEW COMPARISON:  None. FINDINGS: The heart size and mediastinal contours are within normal limits. Mild interstitial prominence of the lungs which may reflect bronchitic change. No pulmonary consolidations. Mild aortic atherosclerosis is noted. No acute osseous abnormality. Mild multilevel slight disc space  narrowing of the included thoracic spine. IMPRESSION: Mild interstitial prominence of the lungs which may reflect a component of bronchitic change. Minimal aortic atherosclerosis. Electronically Signed   By: Tollie Ethavid  Kwon M.D.   On: 01/29/2018 15:29    EKG: Independently reviewed. Normal EKG no acute ischemic changes Assessment/Plan Present on Admission: . AKI (acute kidney injury) (HCC) . Hypokalemia . Respiratory distress  Active Problems:   AKI (acute kidney injury) (HCC)   Hypokalemia   Respiratory distress   Acute respiratory distress, stable.  Suspect likely bronchospasm. TTE from 12/2017 shows grade 1 diastolic dysfunction but no signs of volume overload on exam or chest x-ray.  Slightly tachycardic but in setting of albuterol nebs, normal EKG, no risk factors for PE.  Chest x-ray unremarkable does not suspect pneumonia, no changes in cough to suspect COPD exacerbation either.     Doing well on room air currently.  Previously 2 witnessed episodes of increased work of breathing requiring nebulized treatments in the ED.  Will observe in stepdown unit.  Lung exam is within normal  limits for me. S/p Solumedrol with EMS, start prednisone, PRN IV alb nebs.  AKI, unclear etiology.?  Prerenal?.  Creatinine of 2 (baseline less than 1) has not had any diminished oral intake, no hypotension.  Additionally no new medications.  Will hold off on home lisinopril/HCTZ combo pill.  Repeat BMP in a.m.  Hypokalemia.  Repeat BMP in a.m.  IV and oral supplementation.  Check Mg  Tobacco use.  Seems interested in cessation as patient was recently prescribed Wellbutrin by PCP.  Previous 1 pack/day times almost 40 years.  Continue Wellbutrin here.    DVT prophylaxis: Heparin  Code Status: Full code  Family Communication: Spouse at bedside and updated appropriately at the time of interview.   Disposition Plan: Admit for observation stepdown unit.  Anticipate discharge next day on monitoring respiratory  status and improvement AKI  Consults called: None    Laverna Peace MD [facility]  Pager [phone]  If 7PM-7AM, please contact night-coverage [email or web address] Password [facility]  01/29/2018, 5:52 PM

## 2018-01-29 NOTE — ED Notes (Signed)
Patient transported to X-ray 

## 2018-01-29 NOTE — ED Triage Notes (Signed)
Patient arrived from home via EMS reporting shortness of breathe on exersion with her throat "closing up"  EMS reports that patient received Neb X 2 (Albuterol and Atrovent), she also received 125mg  Solu-medrol, and 700ml of Normal Saline

## 2018-01-29 NOTE — ED Provider Notes (Signed)
[facility]   CSN: [id number] Arrival date & time: 01/29/18  1245     History   Chief Complaint Chief Complaint  Patient presents with  . Shortness of Breath    HPI Sharon Dyer is a 52 y.o. female.  Sharon SchlatterRenee A Dyer is a 52 y.o. Female with a history of hypertension, anxiety and hypoxemia, presents to the emergency department for evaluation of respiratory distress.  Patient reports acute onset of shortness of breath, and sensation that her throat was closing up.  Patient reports for the past 3 days she has had worsening shortness of breath and intermittent cough that is nonproductive, but this episode today was worse than usual, she has breathing treatments at home but these did not seem to help.  Patient called EMS who gave nebulized albuterol and Atrovent as well as 125 of Solu-Medrol and fluid bolus and patient reports improvement in symptoms.  Patient had very similar presentation in July, where she became significantly hypoxic.  She reports her chest feels tight but she denies chest pain associated with the events and has not had any chest pain in the days leading up to her presentation.  No fevers.  No abdominal pain, nausea or vomiting.  No lower extremity swelling.  Initially on arrival symptoms improved, but while talking with the patient she had another acute episode of shortness of breath with increased work of breathing.  Patient does report she is still smoking, but trying to cut back, her PCP just prescribed her a medication to help with smoking cessation which she has not started yet.  The history is provided by the patient and the EMS personnel.    Past Medical History:  Diagnosis Date  . Anxiety   . Hypertension   . Obesity     Patient Active Problem List   Diagnosis Date Noted  . AKI (acute kidney injury) (HCC) 01/29/2018  . Hypokalemia 01/29/2018  . Respiratory distress 01/29/2018  . Hypoxemia 12/22/2017     Past Surgical History:  Procedure Laterality Date  . ECTOPIC PREGNANCY SURGERY  X 2     OB History   None      Home Medications    Prior to Admission medications   Medication Sig Start Date End Date Taking? Authorizing Provider  albuterol (PROVENTIL) (2.5 MG/3ML) 0.083% nebulizer solution Take 3 mLs (2.5 mg total) by nebulization every 2 (two) hours as needed for wheezing. 12/24/17  Yes Kathlen ModyAkula, Vijaya, MD  azithromycin (ZITHROMAX) 500 MG tablet Take 1 tablet (500 mg total) by mouth daily. Patient not taking: Reported on 01/29/2018 12/25/17   Kathlen ModyAkula, Vijaya, MD  hydrOXYzine (ATARAX/VISTARIL) 25 MG tablet Take 1 tablet (25 mg total) every 6 (six) hours as needed by mouth for anxiety. Patient not taking: Reported on 12/23/2017 04/13/17   Antony MaduraHumes, Kelly, PA-C  lisinopril-hydrochlorothiazide (ZESTORETIC) 10-12.5 MG tablet Take 1 tablet daily by mouth. Patient not taking: Reported on 01/29/2018 04/13/17   Antony MaduraHumes, Kelly, PA-C  pantoprazole (PROTONIX) 20 MG tablet Take 1 tablet (20 mg total) by mouth daily. Patient not taking: Reported on 01/29/2018 12/24/17   Kathlen ModyAkula, Vijaya, MD  predniSONE (DELTASONE) 10 MG tablet Prednisone 50 mg daily for 2 days followed by  Prednisone 40 mg daily for 3 days followed by  Prednisone 20 mg daily for 3 days Patient not taking: Reported on 01/29/2018 12/24/17   Kathlen ModyAkula, Vijaya, MD    Family History No family history on file.  Social History Social History  Tobacco Use  . Smoking status: Current Every Day Smoker    Packs/day: 1.00    Years: 37.00    Pack years: 37.00    Types: Cigarettes  . Smokeless tobacco: Never Used  Substance Use Topics  . Alcohol use: Yes    Comment: 12/22/2017 "maybe twice a year"  . Drug use: Never     Allergies   Patient has no known allergies.   Review of Systems Review of Systems  Constitutional: Negative for chills.  HENT: Negative for congestion, trouble swallowing and voice change.   Eyes: Negative for visual  disturbance.  Respiratory: Positive for chest tightness and stridor.   Cardiovascular: Negative for palpitations.  Gastrointestinal: Negative for nausea.  Genitourinary: Negative for dysuria and frequency.  Musculoskeletal: Negative for arthralgias and myalgias.  Skin: Negative for color change.  Neurological: Negative for dizziness, syncope and light-headedness.     Physical Exam Updated Vital Signs BP 96/63   Pulse (!) 102   Resp 12   Ht 5\' 8"  (1.727 m)   Wt 127.5 kg   LMP 01/12/2015   SpO2 99%   BMI 42.73 kg/m   Physical Exam  Constitutional: She appears well-developed and well-nourished. No distress.  HENT:  Head: Normocephalic and atraumatic.  Mouth/Throat: Oropharynx is clear and moist.  Posterior oropharynx is clear without edema, erythema or exudates, normal phonation, no trismus, patient tolerating secretions without difficulty  Eyes: Pupils are equal, round, and reactive to light. EOM are normal. Right eye exhibits no discharge. Left eye exhibits no discharge.  Neck: Normal range of motion. Neck supple. No tracheal deviation present.  No stridor  Cardiovascular: Normal rate, regular rhythm, normal heart sounds and intact distal pulses. Exam reveals no gallop and no friction rub.  No murmur heard. Pulmonary/Chest: No respiratory distress. She has wheezes. She exhibits no tenderness.  Patient is mildly tachypneic with some increased respiratory effort, able to speak in short sentences.  On auscultation faint wheezes heard throughout, and lung sounds diminished bilaterally.  Abdominal: Soft. Bowel sounds are normal. She exhibits no distension and no mass. There is no tenderness. There is no guarding.  Abdomen soft, nondistended, nontender to palpation in all quadrants without guarding or peritoneal signs  Musculoskeletal:       Right lower leg: Normal. She exhibits no tenderness and no edema.       Left lower leg: Normal. She exhibits no tenderness and no edema.   Neurological: She is alert. Coordination normal.  Skin: Skin is warm and dry. Capillary refill takes less than 2 seconds. She is not diaphoretic.  Psychiatric: She has a normal mood and affect. Her behavior is normal.  Nursing note and vitals reviewed.    ED Treatments / Results  Labs (all labs ordered are listed, but only abnormal results are displayed) Labs Reviewed  BASIC METABOLIC PANEL - Abnormal; Notable for the following components:      Result Value   Potassium 2.8 (*)    Glucose, Bld 135 (*)    BUN 29 (*)    Creatinine, Ser 2.04 (*)    GFR calc non Af Amer 26 (*)    GFR calc Af Amer 31 (*)    All other components within normal limits  CBC - Abnormal; Notable for the following components:   WBC 15.8 (*)    All other components within normal limits  I-STAT TROPONIN, ED    EKG EKG Interpretation  Date/Time:  Thursday January 29 2018 12:49:05 EDT Ventricular Rate:  97  PR Interval:    QRS Duration: 89 QT Interval:  392 QTC Calculation: 498 R Axis:   3 Text Interpretation:  Sinus rhythm Low voltage, precordial leads Borderline prolonged QT interval When compared to prior, lonsger QTc.  no STEMI Confirmed by Theda Belfast ([id number]) on 01/29/2018 3:16:07 PM   Radiology Dg Chest 2 View  Result Date: 01/29/2018 CLINICAL DATA:  Dyspnea EXAM: CHEST - 2 VIEW COMPARISON:  None. FINDINGS: The heart size and mediastinal contours are within normal limits. Mild interstitial prominence of the lungs which may reflect bronchitic change. No pulmonary consolidations. Mild aortic atherosclerosis is noted. No acute osseous abnormality. Mild multilevel slight disc space narrowing of the included thoracic spine. IMPRESSION: Mild interstitial prominence of the lungs which may reflect a component of bronchitic change. Minimal aortic atherosclerosis. Electronically Signed   By: Tollie Eth M.D.   On: 01/29/2018 15:29    Procedures .Critical Care Performed by: Dartha Lodge, PA-C Authorized  by: Dartha Lodge, PA-C   Critical care provider statement:    Critical care time (minutes):  45   Critical care time was exclusive of:  Separately billable procedures and treating other patients   Critical care was necessary to treat or prevent imminent or life-threatening deterioration of the following conditions:  Respiratory failure   Critical care was time spent personally by me on the following activities:  Discussions with consultants, evaluation of patient's response to treatment, examination of patient, ordering and performing treatments and interventions, ordering and review of laboratory studies, ordering and review of radiographic studies, pulse oximetry, re-evaluation of patient's condition, obtaining history from patient or surrogate and review of old charts   (including critical care time)  Medications Ordered in ED Medications  magnesium sulfate IVPB 2 g 50 mL (has no administration in time range)  potassium chloride 10 mEq in 100 mL IVPB (10 mEq Intravenous New Bag/Given 01/29/18 1535)  sodium chloride 0.9 % bolus 1,000 mL (1,000 mLs Intravenous New Bag/Given 01/29/18 1534)  albuterol (PROVENTIL) (2.5 MG/3ML) 0.083% nebulizer solution 5 mg (5 mg Nebulization Given 01/29/18 1334)  ipratropium (ATROVENT) nebulizer solution 0.5 mg (0.5 mg Nebulization Given 01/29/18 1334)  albuterol (PROVENTIL,VENTOLIN) solution continuous neb (10 mg/hr Nebulization Given 01/29/18 1347)  potassium chloride SA (K-DUR,KLOR-CON) CR tablet 40 mEq (40 mEq Oral Given 01/29/18 1530)     Initial Impression / Assessment and Plan / ED Course  I have reviewed the triage vital signs and the nursing notes.  Pertinent labs & imaging results that were available during my care of the patient were reviewed by me and considered in my medical decision making (see chart for details).  Clinical Course as of Jan 30 1603  Thu Jan 29, 2018  1320 While evaluating patient she had acute episode of wheezing and difficulty  breathing, faint stridor heard on exam, but when auscultating over the trachea appears to be moving good air   [KF]  1330 Leukocytosis of 15.8, normal hgb.  Leukocytosis may be acute stress reaction or in response to steroids.  Chest x-ray pending to assess for pneumonia, no other acute infectious symptoms present.  CBC(!) [KF]  1330 Significant hypokalemia of 2.8, no obvious EKG changes.  Patient also with AKI with creatinine of 2.04, patient's baseline appears to be 0.9.  No other electrolyte derangements requiring intervention.  Will start fluid bolus.  P.o. potassium as well as IV potassium for replenishment.  Patient will also be given 2 g IV Mag to help with breathing.  Basic metabolic  panel(!) [KF]  1400 Trop 0.05, EKG without concerning changes  I-stat troponin, ED [KF]  1530 After 1 Hour of CAT patient reports improvement in her breathing, her air movement is improved on exam but she still sounds somewhat tight throughout with intermittent faint wheezing.  No stridor noted.   [KF]  1543 Chest x-ray with some bronchitic changes, no pneumonia  DG Chest 2 View [KF]  1600 Will admit for intermittent bronchospasm with increased work of breathing as well as AKI and hypokalemia.  Discussed with Dr. Caleb Popp with [facility] who will see and admit the patient, I feel patient will require stepdown bed for close monitoring respiratory status.   [KF]    Clinical Course User Index [KF] Dartha Lodge, PA-C    Case discussed with Dr. Rush Landmark who is in agreement with plan.  Final Clinical Impressions(s) / ED Diagnoses   Final diagnoses:  Bronchospasm  Acute respiratory failure, unspecified whether with hypoxia or hypercapnia (HCC)  AKI (acute kidney injury) [facility])  Hypokalemia    ED Discharge Orders    None       Dartha Lodge, [place] 01/29/18 1622    Tegeler, Canary Brim, MD 01/29/18 2028

## 2018-01-30 DIAGNOSIS — R0603 Acute respiratory distress: Secondary | ICD-10-CM

## 2018-01-30 DIAGNOSIS — J9600 Acute respiratory failure, unspecified whether with hypoxia or hypercapnia: Secondary | ICD-10-CM | POA: Diagnosis not present

## 2018-01-30 DIAGNOSIS — J9801 Acute bronchospasm: Secondary | ICD-10-CM | POA: Diagnosis not present

## 2018-01-30 DIAGNOSIS — E876 Hypokalemia: Secondary | ICD-10-CM | POA: Diagnosis not present

## 2018-01-30 DIAGNOSIS — N179 Acute kidney failure, unspecified: Secondary | ICD-10-CM

## 2018-01-30 DIAGNOSIS — Z720 Tobacco use: Secondary | ICD-10-CM | POA: Diagnosis not present

## 2018-01-30 LAB — BASIC METABOLIC PANEL
ANION GAP: 10 (ref 5–15)
BUN: 36 mg/dL — ABNORMAL HIGH (ref 6–20)
CO2: 26 mmol/L (ref 22–32)
Calcium: 9.5 mg/dL (ref 8.9–10.3)
Chloride: 101 mmol/L (ref 98–111)
Creatinine, Ser: 1.35 mg/dL — ABNORMAL HIGH (ref 0.44–1.00)
GFR calc non Af Amer: 44 mL/min — ABNORMAL LOW (ref >60)
GFR, EST AFRICAN AMERICAN: 51 mL/min — AB (ref >60)
GLUCOSE: 116 mg/dL — AB (ref 70–99)
POTASSIUM: 4.1 mmol/L (ref 3.5–5.1)
Sodium: 137 mmol/L (ref 135–145)

## 2018-01-30 LAB — BRAIN NATRIURETIC PEPTIDE: B Natriuretic Peptide: 221.8 pg/mL — ABNORMAL HIGH (ref 0.0–100.0)

## 2018-01-30 LAB — CBC
HEMATOCRIT: 43.5 % (ref 36.0–46.0)
HEMOGLOBIN: 14.2 g/dL (ref 12.0–15.0)
MCH: 29.6 pg (ref 26.0–34.0)
MCHC: 32.6 g/dL (ref 30.0–36.0)
MCV: 90.8 fL (ref 78.0–100.0)
Platelets: 185 10*3/uL (ref 150–400)
RBC: 4.79 MIL/uL (ref 3.87–5.11)
RDW: 12.8 % (ref 11.5–15.5)
WBC: 23.7 10*3/uL — ABNORMAL HIGH (ref 4.0–10.5)

## 2018-01-30 LAB — D-DIMER, QUANTITATIVE (NOT AT ARMC): D DIMER QUANT: 1.85 ug{FEU}/mL — AB (ref 0.00–0.50)

## 2018-01-30 MED ORDER — BUPROPION HCL ER (XL) 150 MG PO TB24: 150.0000 mg | ORAL_TABLET | Freq: Every day | ORAL | 0 refills | Status: DC

## 2018-01-30 MED ORDER — LOSARTAN POTASSIUM-HCTZ 100-25 MG PO TABS: 1.0000 | ORAL_TABLET | Freq: Every day | ORAL | 0 refills | Status: DC

## 2018-01-30 MED ORDER — HYDROXYZINE HCL 25 MG PO TABS: 25.0000 mg | ORAL_TABLET | Freq: Four times a day (QID) | ORAL | 0 refills | Status: AC | PRN

## 2018-01-30 MED ORDER — PANTOPRAZOLE SODIUM 20 MG PO TBEC: 20.0000 mg | DELAYED_RELEASE_TABLET | Freq: Every day | ORAL | 0 refills | Status: DC

## 2018-01-30 MED ORDER — POTASSIUM CHLORIDE 20 MEQ/15ML (10%) PO SOLN
40.0000 meq | Freq: Once | ORAL | Status: AC
  Administered 2018-01-30: 40 meq via ORAL
  Filled 2018-01-30: qty 30

## 2018-01-30 NOTE — Progress Notes (Signed)
Sharon Dyer to be D/C'd Home per MD order.  Discussed prescriptions and follow up appointments with the patient. Prescriptions given to patient, medication list explained in detail. Pt verbalized understanding.  Allergies as of 01/30/2018   No Known Allergies     Medication List    STOP taking these medications   azithromycin 500 MG tablet Commonly known as:  ZITHROMAX   lisinopril-hydrochlorothiazide 10-12.5 MG tablet Commonly known as:  PRINZIDE,ZESTORETIC   predniSONE 10 MG tablet Commonly known as:  DELTASONE     TAKE these medications   albuterol (2.5 MG/3ML) 0.083% nebulizer solution Commonly known as:  PROVENTIL Take 3 mLs (2.5 mg total) by nebulization every 2 (two) hours as needed for wheezing.   buPROPion 150 MG 24 hr tablet Commonly known as:  WELLBUTRIN XL Take 1 tablet (150 mg total) by mouth daily.   hydrOXYzine 25 MG tablet Commonly known as:  ATARAX/VISTARIL Take 1 tablet (25 mg total) by mouth every 6 (six) hours as needed for anxiety.   losartan-hydrochlorothiazide 100-25 MG tablet Commonly known as:  HYZAAR Take 1 tablet by mouth daily.   pantoprazole 20 MG tablet Commonly known as:  PROTONIX Take 1 tablet (20 mg total) by mouth daily.       Vitals:   01/30/18 0415 01/30/18 1008  BP:  111/75  Pulse:  65  Resp:  15  Temp: 97.6 F (36.4 C)   SpO2:  95%    Skin clean, dry and intact without evidence of skin break down, no evidence of skin tears noted. IV catheter discontinued intact. Site without signs and symptoms of complications. Dressing and pressure applied. Pt denies pain at this time. No complaints noted.  An After Visit Summary was printed and given to the patient. Patient escorted via WC, and D/C home via private auto.  Fara BorosSara Jaydynn Wolford BSN, RN [employer] [facility] Phone [phone]

## 2018-01-30 NOTE — Progress Notes (Signed)
New admission to floor at 2010, alert and oriented x 4, denies pain and shortness of breath with assess.  Patient on bedside telemetry in ST.  Patient express that she has shortness of breath periodically with strenuous activity.  She also express concerns about being able to afford prescribed medications.  She had an uneventful night, she slept 95% with not respiratory distress. Will continue to monitor.

## 2018-01-30 NOTE — Discharge Summary (Signed)
Physician Discharge Summary  Sharon Dyer ZOX:[id number] DOB: 25-Apr-1966 DOA: 01/29/2018  PCP: No primary care provider on file.  Admit date: 01/29/2018 Discharge date: 01/30/2018  Admitted From: Home  Disposition:  Home   Recommendations for Outpatient Follow-up:  1. Follow up with PCP in 1-2 weeks 2. Please obtain BMP/CBC in one week your next doctors visit to ensure Renal function remains stable 3. Needs outpatient Evaluation by ENT.  4. Would benefit from outpatient Sleep Study  5. Needs outpatient referral with Allergy/Immunology.   Home Health: Home  Equipment/Devices: None  Discharge Condition: Stable CODE STATUS: Full Diet recommendation: 2g Na diet.   Brief/Interim Summary: 52 yo female with hx of tobacco use, HTN, chronic back pain and b/l Ankle pain came to the hospital with complaints of shortness of breath. She described it as something in back of her throat after eating. Denies eating anything out of unusual. Smiliar thing happened about a month ago. By the time EMS arrived, her shortness of breath has resolved and felt back to her baseline.  She had an echo about a month ago which showed g1DD otherwise normal. CXR shows some chronic bronchitic changes. Her initial labs showed AKI with Cr of 2.0, which was up from baseline of 1.0. She was started on IVF which she tolerated well. The following day did not have any complaints. Saturating well on room air, ambulating without any issues.   I recommended she quits smoking, follow up with PCP and get referral for ENT, Allergy/Immunology, and Sleep study. She understands and will take care of it.   Reached max benefit from hostpial stay and is stable to be discharged.    Discharge Diagnoses:  Active Problems:   AKI (acute kidney injury) (HCC)   Hypokalemia   Respiratory distress   Tobacco abuse  Acute Respiratory Distress; resolved -possible concern for some sort of allergy but denies any known triggers. Advised  outpatient referral to ENT/Allergy/Immunology. Sleep study as well. Feels back to baseline. Would benefit from possible laryngoscopy outpatient, defer this to ENT.  -recent echo reviewed.   Acute Kidney Injury, resolved -resolved with IVF. Cr has trended down to 1.3 with IVF. It had peaked around 2.0 up from baseline of 1.0.   Tobacco Use -counseled to quit smoking, She has prescription for wellbutrin.   Hypokalemia -resolved.   HepSQ while she was here Full Code Discharge today Discharge Instructions   Allergies as of 01/30/2018   No Known Allergies     Medication List    TAKE these medications   albuterol (2.5 MG/3ML) 0.083% nebulizer solution Commonly known as:  PROVENTIL Take 3 mLs (2.5 mg total) by nebulization every 2 (two) hours as needed for wheezing.   azithromycin 500 MG tablet Commonly known as:  ZITHROMAX Take 1 tablet (500 mg total) by mouth daily.   buPROPion 150 MG 24 hr tablet Commonly known as:  WELLBUTRIN XL Take 150 mg by mouth daily.   hydrOXYzine 25 MG tablet Commonly known as:  ATARAX/VISTARIL Take 1 tablet (25 mg total) every 6 (six) hours as needed by mouth for anxiety.   lisinopril-hydrochlorothiazide 10-12.5 MG tablet Commonly known as:  PRINZIDE,ZESTORETIC Take 1 tablet daily by mouth.   losartan-hydrochlorothiazide 100-25 MG tablet Commonly known as:  HYZAAR Take 1 tablet by mouth daily.   pantoprazole 20 MG tablet Commonly known as:  PROTONIX Take 1 tablet (20 mg total) by mouth daily.   predniSONE 10 MG tablet Commonly known as:  DELTASONE Prednisone 50 mg daily  for 2 days followed by  Prednisone 40 mg daily for 3 days followed by  Prednisone 20 mg daily for 3 days      Follow-up Information    Ralene Ok, MD. Schedule an appointment as soon as possible for a visit in 1 week(s).   Specialty:  Internal Medicine Contact information: [address] [place] [address] [phone]          No Known  Allergies  You were cared for by a hospitalist during your hospital stay. If you have any questions about your discharge medications or the care you received while you were in the hospital after you are discharged, you can call the unit and asked to speak with the hospitalist on call if the hospitalist that took care of you is not available. Once you are discharged, your primary care physician will handle any further medical issues. Please note that no refills for any discharge medications will be authorized once you are discharged, as it is imperative that you return to your primary care physician (or establish a relationship with a primary care physician if you do not have one) for your aftercare needs so that they can reassess your need for medications and monitor your lab values.  Consultations:  None   Procedures/Studies: Dg Chest 2 View  Result Date: 01/29/2018 CLINICAL DATA:  Dyspnea EXAM: CHEST - 2 VIEW COMPARISON:  None. FINDINGS: The heart size and mediastinal contours are within normal limits. Mild interstitial prominence of the lungs which may reflect bronchitic change. No pulmonary consolidations. Mild aortic atherosclerosis is noted. No acute osseous abnormality. Mild multilevel slight disc space narrowing of the included thoracic spine. IMPRESSION: Mild interstitial prominence of the lungs which may reflect a component of bronchitic change. Minimal aortic atherosclerosis. Electronically Signed   By: Tollie Eth M.D.   On: 01/29/2018 15:29      Subjective: Patient wishes to go home today, she feels back to herself.   General = no fevers, chills, dizziness, malaise, fatigue HEENT/EYES = negative for pain, redness, loss of vision, double vision, blurred vision, loss of hearing, sore throat, hoarseness, dysphagia Cardiovascular= negative for chest pain, palpitation, murmurs, lower extremity swelling Respiratory/lungs= negative for shortness of breath, cough, hemoptysis, wheezing,  mucus production Gastrointestinal= negative for nausea, vomiting,, abdominal pain, melena, hematemesis Genitourinary= negative for Dysuria, Hematuria, Change in Urinary Frequency MSK = Negative for arthralgia, myalgias, Back Pain, Joint swelling  Neurology= Negative for headache, seizures, numbness, tingling  Psychiatry= Negative for anxiety, depression, suicidal and homocidal ideation Allergy/Immunology= Medication/Food allergy as listed  Skin= Negative for Rash, lesions, ulcers, itching   Discharge Exam: Vitals:   01/30/18 0415 01/30/18 1008  BP:  111/75  Pulse:  65  Resp:  15  Temp: 97.6 F (36.4 C)   SpO2:  95%   Vitals:   01/29/18 2157 01/29/18 2200 01/30/18 0415 01/30/18 1008  BP:    111/75  Pulse:    65  Resp:    15  Temp: 98.3 F (36.8 C)  97.6 F (36.4 C)   TempSrc: Oral  Oral   SpO2:    95%  Weight:  129.9 kg    Height:  5' 7.99" (1.727 m)      General: Pt is alert, awake, not in acute distress Cardiovascular: RRR, S1/S2 +, no rubs, no gallops Respiratory: CTA bilaterally, no wheezing, no rhonchi Abdominal: Soft, NT, ND, bowel sounds + Extremities: no edema, no cyanosis    The results of significant diagnostics from this hospitalization (  including imaging, microbiology, ancillary and laboratory) are listed below for reference.     Microbiology: No results found for this or any previous visit (from the past 240 hour(s)).   Labs: BNP (last 3 results) Recent Labs    12/22/17 1344 01/30/18 0806  BNP 100.7* 221.8*   Basic Metabolic Panel: Recent Labs  Lab 01/29/18 1319 01/29/18 1853 01/30/18 0549  NA 139  --  137  K 2.8*  --  4.1  CL 99  --  101  CO2 30  --  26  GLUCOSE 135*  --  116*  BUN 29*  --  36*  CREATININE 2.04* 2.21* 1.35*  CALCIUM 8.9  --  9.5  MG  --  1.6*  --    Liver Function Tests: No results for input(s): AST, ALT, ALKPHOS, BILITOT, PROT, ALBUMIN in the last 168 hours. No results for input(s): LIPASE, AMYLASE in the last  168 hours. No results for input(s): AMMONIA in the last 168 hours. CBC: Recent Labs  Lab 01/29/18 1319 01/29/18 1853 01/30/18 0549  WBC 15.8* 15.9* 23.7*  HGB 14.5 13.7 14.2  HCT 45.2 44.4 43.5  MCV 92.8 93.5 90.8  PLT 194 190 185   Cardiac Enzymes: No results for input(s): CKTOTAL, CKMB, CKMBINDEX, TROPONINI in the last 168 hours. BNP: Invalid input(s): POCBNP CBG: No results for input(s): GLUCAP in the last 168 hours. D-Dimer Recent Labs    01/30/18 0806  DDIMER 1.85*   Hgb A1c No results for input(s): HGBA1C in the last 72 hours. Lipid Profile No results for input(s): CHOL, HDL, LDLCALC, TRIG, CHOLHDL, LDLDIRECT in the last 72 hours. Thyroid function studies No results for input(s): TSH, T4TOTAL, T3FREE, THYROIDAB in the last 72 hours.  Invalid input(s): FREET3 Anemia work up No results for input(s): VITAMINB12, FOLATE, FERRITIN, TIBC, IRON, RETICCTPCT in the last 72 hours. Urinalysis    Component Value Date/Time   COLORURINE YELLOW 03/26/2015 2352   APPEARANCEUR CLOUDY (A) 03/26/2015 2352   LABSPEC 1.019 03/26/2015 2352   PHURINE 5.5 03/26/2015 2352   GLUCOSEU NEGATIVE 03/26/2015 2352   HGBUR LARGE (A) 03/26/2015 2352   BILIRUBINUR NEGATIVE 03/26/2015 2352   KETONESUR NEGATIVE 03/26/2015 2352   PROTEINUR 30 (A) 03/26/2015 2352   UROBILINOGEN 1.0 03/26/2015 2352   NITRITE NEGATIVE 03/26/2015 2352   LEUKOCYTESUR SMALL (A) 03/26/2015 2352   Sepsis Labs Invalid input(s): PROCALCITONIN,  WBC,  LACTICIDVEN Microbiology No results found for this or any previous visit (from the past 240 hour(s)).   Time coordinating discharge:  I have spent 35 minutes face to face with the patient and on the ward discussing the patients care, assessment, plan and disposition with other care givers. >50% of the time was devoted counseling the patient about the risks and benefits of treatment/Discharge disposition and coordinating care.   SIGNED:   Dimple Nanas,  MD  [facility] 01/30/2018, 10:31 AM Pager   If 7PM-7AM, please contact night-coverage [email or web address] Password [email or web address]

## 2018-01-30 NOTE — Plan of Care (Signed)

## 2018-03-10 ENCOUNTER — Other Ambulatory Visit: Payer: Self-pay | Admitting: Internal Medicine

## 2018-03-10 DIAGNOSIS — E041 Nontoxic single thyroid nodule: Secondary | ICD-10-CM

## 2018-03-16 ENCOUNTER — Ambulatory Visit
Admission: RE | Admit: 2018-03-16 | Discharge: 2018-03-16 | Disposition: A | Discharge status: Home / Self Care | Payer: Managed Care, Other (non HMO) | Source: Ambulatory Visit | Attending: Internal Medicine | Admitting: Internal Medicine

## 2018-03-16 DIAGNOSIS — E041 Nontoxic single thyroid nodule: Secondary | ICD-10-CM

## 2018-03-23 ENCOUNTER — Ambulatory Visit (INDEPENDENT_AMBULATORY_CARE_PROVIDER_SITE_OTHER): Payer: PRIVATE HEALTH INSURANCE | Admitting: Orthopedic Surgery

## 2018-03-30 ENCOUNTER — Other Ambulatory Visit: Payer: Self-pay | Admitting: Internal Medicine

## 2018-03-30 DIAGNOSIS — E041 Nontoxic single thyroid nodule: Secondary | ICD-10-CM

## 2018-03-31 ENCOUNTER — Other Ambulatory Visit: Payer: Self-pay | Admitting: Internal Medicine

## 2018-03-31 DIAGNOSIS — E049 Nontoxic goiter, unspecified: Secondary | ICD-10-CM

## 2018-04-13 ENCOUNTER — Other Ambulatory Visit: Payer: Self-pay | Admitting: Internal Medicine

## 2018-04-13 DIAGNOSIS — Z1231 Encounter for screening mammogram for malignant neoplasm of breast: Secondary | ICD-10-CM

## 2018-05-05 ENCOUNTER — Inpatient Hospital Stay
Admission: RE | Admit: 2018-05-05 | Discharge: 2018-05-05 | Disposition: A | Discharge status: Home / Self Care | Payer: Managed Care, Other (non HMO) | Source: Ambulatory Visit | Attending: Internal Medicine | Admitting: Internal Medicine

## 2019-01-21 ENCOUNTER — Emergency Department (HOSPITAL_COMMUNITY): Payer: Self-pay

## 2019-01-21 ENCOUNTER — Other Ambulatory Visit: Payer: Self-pay

## 2019-01-21 ENCOUNTER — Emergency Department (HOSPITAL_COMMUNITY)
Admission: EM | Admit: 2019-01-21 | Discharge: 2019-01-21 | Disposition: A | Discharge status: Home / Self Care | Payer: Self-pay | Attending: Emergency Medicine | Admitting: Emergency Medicine

## 2019-01-21 DIAGNOSIS — Z79899 Other long term (current) drug therapy: Secondary | ICD-10-CM | POA: Insufficient documentation

## 2019-01-21 DIAGNOSIS — I10 Essential (primary) hypertension: Secondary | ICD-10-CM | POA: Insufficient documentation

## 2019-01-21 DIAGNOSIS — F17210 Nicotine dependence, cigarettes, uncomplicated: Secondary | ICD-10-CM | POA: Insufficient documentation

## 2019-01-21 DIAGNOSIS — R51 Headache: Secondary | ICD-10-CM | POA: Insufficient documentation

## 2019-01-21 LAB — CBC WITH DIFFERENTIAL/PLATELET
Abs Immature Granulocytes: 0.04 10*3/uL (ref 0.00–0.07)
Basophils Absolute: 0.1 10*3/uL (ref 0.0–0.1)
Basophils Relative: 1 %
Eosinophils Absolute: 0.2 10*3/uL (ref 0.0–0.5)
Eosinophils Relative: 2 %
HCT: 46.7 % — ABNORMAL HIGH (ref 36.0–46.0)
Hemoglobin: 14.9 g/dL (ref 12.0–15.0)
Immature Granulocytes: 0 %
Lymphocytes Relative: 21 %
Lymphs Abs: 2 10*3/uL (ref 0.7–4.0)
MCH: 29.1 pg (ref 26.0–34.0)
MCHC: 31.9 g/dL (ref 30.0–36.0)
MCV: 91.2 fL (ref 80.0–100.0)
Monocytes Absolute: 0.8 10*3/uL (ref 0.1–1.0)
Monocytes Relative: 9 %
Neutro Abs: 6.3 10*3/uL (ref 1.7–7.7)
Neutrophils Relative %: 67 %
Platelets: 225 10*3/uL (ref 150–400)
RBC: 5.12 MIL/uL — ABNORMAL HIGH (ref 3.87–5.11)
RDW: 13.3 % (ref 11.5–15.5)
WBC: 9.4 10*3/uL (ref 4.0–10.5)
nRBC: 0 % (ref 0.0–0.2)

## 2019-01-21 LAB — BASIC METABOLIC PANEL
Anion gap: 11 (ref 5–15)
BUN: 9 mg/dL (ref 6–20)
CO2: 25 mmol/L (ref 22–32)
Calcium: 9.3 mg/dL (ref 8.9–10.3)
Chloride: 102 mmol/L (ref 98–111)
Creatinine, Ser: 0.7 mg/dL (ref 0.44–1.00)
GFR calc Af Amer: >60 mL/min (ref >60)
GFR calc non Af Amer: >60 mL/min (ref >60)
Glucose, Bld: 96 mg/dL (ref 70–99)
Potassium: 3.9 mmol/L (ref 3.5–5.1)
Sodium: 138 mmol/L (ref 135–145)

## 2019-01-21 MED ORDER — DIPHENHYDRAMINE HCL 50 MG/ML IJ SOLN
25.0000 mg | Freq: Once | INTRAMUSCULAR | Status: AC
  Administered 2019-01-21: 16:00:00 25 mg via INTRAVENOUS
  Filled 2019-01-21: qty 1

## 2019-01-21 MED ORDER — METOCLOPRAMIDE HCL 5 MG/ML IJ SOLN
10.0000 mg | Freq: Once | INTRAMUSCULAR | Status: AC
  Administered 2019-01-21: 10 mg via INTRAVENOUS
  Filled 2019-01-21: qty 2

## 2019-01-21 MED ORDER — LOSARTAN POTASSIUM-HCTZ 100-25 MG PO TABS: 1.0000 | ORAL_TABLET | Freq: Every day | ORAL | 0 refills | Status: DC

## 2019-01-21 NOTE — Progress Notes (Addendum)
TOC CM received consult for medication assistance. Most blood pressure meds are $4 on the [facility] $4 list or she can use the [facility] and [place] were the meds are $4 or $10 (this pharmacy does not fill narcotics). [facility] closes at 5 pm. Will arrange appt for pt to follow up at [facility]. Appt scheduled for 02/05/2019 at 78 am.  Jonnie Finner RN CCM Case Mgmt phone [phone]

## 2019-01-21 NOTE — ED Provider Notes (Signed)
[facility]   CSN: [id number] Arrival date & time: 01/21/19  1151    History   Chief Complaint Chief Complaint  Patient presents with  . Hypertension    HPI Sharon Dyer is a 53 y.o. female.     53 y.o female with a PMH of HTN, Obesity presents to the ED with a chief complaint of headache times today.  She describes this headache as an ache worse on the occipital region, states she had taken some pain pills to help with this pain which improved.  She also endorses some blurry vision during this episode, states is more difficult to see from afar.  She does wear glasses at baseline, but does not use them.  Patient reports she has a history of hypertension, was originally on Hyzaar 100-325 mg, however, has not had this medication filled in several months.  She reports she is currently taking her boyfriend's losartan, last took this prior to arrival 50 mg.  She reports she did not have the money to fill her medication.  She denies any dizziness, chest pain, shortness of breath, nausea or vomiting.  No trauma.  The history is provided by the patient and medical records.  Hypertension Associated symptoms include headaches and shortness of breath (at baseline). Pertinent negatives include no chest pain and no abdominal pain.    Past Medical History:  Diagnosis Date  . Anxiety   . Arthritis    Patient verbalizes she has athritis to right ankle  . Dyspnea   . Hypertension   . Obesity     Patient Active Problem List   Diagnosis Date Noted  . AKI (acute kidney injury) (HCC) 01/29/2018  . Hypokalemia 01/29/2018  . Respiratory distress 01/29/2018  . Tobacco abuse 01/29/2018  . Hypoxemia 12/22/2017    Past Surgical History:  Procedure Laterality Date  . ECTOPIC PREGNANCY SURGERY  X 2     OB History   No obstetric history on file.      Home Medications    Prior to Admission medications   Medication Sig Start Date End  Date Taking? Authorizing Provider  acetaminophen (TYLENOL) 500 MG tablet Take 500 mg by mouth every 6 (six) hours as needed for moderate pain.   Yes [provider]  albuterol (PROVENTIL) (2.5 MG/3ML) 0.083% nebulizer solution Take 3 mLs (2.5 mg total) by nebulization every 2 (two) hours as needed for wheezing. 12/24/17   Kathlen ModyAkula, Vijaya, MD  buPROPion (WELLBUTRIN XL) 150 MG 24 hr tablet Take 1 tablet (150 mg total) by mouth daily. 01/30/18 03/01/18  Amin, Loura HaltAnkit Chirag, MD  losartan-hydrochlorothiazide (HYZAAR) 100-25 MG tablet Take 1 tablet by mouth daily. 01/30/18 03/01/18  Amin, Loura HaltAnkit Chirag, MD  pantoprazole (PROTONIX) 20 MG tablet Take 1 tablet (20 mg total) by mouth daily. 01/30/18 03/01/18  Dimple NanasAmin, Ankit Chirag, MD    Family History Family History  Problem Relation Age of Onset  . Hypertension Mother     Social History Social History   Tobacco Use  . Smoking status: Current Every Day Smoker    Packs/day: 1.00    Years: 37.00    Pack years: 37.00    Types: Cigarettes  . Smokeless tobacco: Never Used  Substance Use Topics  . Alcohol use: Yes    Comment: 12/22/2017 "maybe twice a year"  . Drug use: Never     Allergies   Patient has no known allergies.   Review of Systems Review of Systems  Constitutional:  Negative for chills and fever.  HENT: Negative for ear pain and sore throat.   Eyes: Negative for pain and visual disturbance.  Respiratory: Positive for shortness of breath (at baseline). Negative for cough.   Cardiovascular: Negative for chest pain and palpitations.  Gastrointestinal: Negative for abdominal pain and vomiting.  Genitourinary: Negative for dysuria and hematuria.  Musculoskeletal: Negative for arthralgias and back pain.  Skin: Negative for color change and rash.  Neurological: Positive for headaches. Negative for seizures, syncope and light-headedness.  All other systems reviewed and are negative.    Physical Exam Updated Vital Signs BP (!)  144/81   Pulse 61   Temp 98.3 F (36.8 C) (Oral)   Resp 16   LMP 01/12/2015   SpO2 99%   Physical Exam Vitals signs and nursing note reviewed.  Constitutional:      General: She is not in acute distress.    Appearance: She is well-developed.  HENT:     Head: Normocephalic and atraumatic.     Mouth/Throat:     Pharynx: No oropharyngeal exudate.  Eyes:     Pupils: Pupils are equal, round, and reactive to light.  Neck:     Musculoskeletal: Normal range of motion.  Cardiovascular:     Rate and Rhythm: Regular rhythm.     Comments: No bilateral leg edema. Pulmonary:     Effort: Pulmonary effort is normal. No respiratory distress.     Breath sounds: Normal breath sounds.     Comments: Lungs are clear to auscultation. Abdominal:     General: Bowel sounds are normal. There is no distension.     Palpations: Abdomen is soft.     Tenderness: There is no abdominal tenderness.     Comments: Abdomen is soft, bowel sounds present.  No guarding or rebound.  Musculoskeletal:        General: No tenderness or deformity.     Right lower leg: No edema.     Left lower leg: No edema.  Skin:    General: Skin is warm and dry.  Neurological:     Mental Status: She is alert and oriented to person, place, and time.      ED Treatments / Results  Labs (all labs ordered are listed, but only abnormal results are displayed) Labs Reviewed  CBC WITH DIFFERENTIAL/PLATELET - Abnormal; Notable for the following components:      Result Value   RBC 5.12 (*)    HCT 46.7 (*)    All other components within normal limits  BASIC METABOLIC PANEL    EKG None  Radiology Dg Chest 2 View  Result Date: 01/21/2019 CLINICAL DATA:  Shortness of breath EXAM: CHEST - 2 VIEW COMPARISON:  01/29/2018 FINDINGS: The heart size and mediastinal contours are within normal limits. Both lungs are clear. Mild degenerative changes of the spine. IMPRESSION: No active cardiopulmonary disease. Electronically Signed   By:  Jasmine PangKim  Fujinaga M.D.   On: 01/21/2019 15:16    Procedures Procedures (including critical care time)  Medications Ordered in ED Medications  metoCLOPramide (REGLAN) injection 10 mg (has no administration in time range)  diphenhydrAMINE (BENADRYL) injection 25 mg (has no administration in time range)     Initial Impression / Assessment and Plan / ED Course  I have reviewed the triage vital signs and the nursing notes.  Pertinent labs & imaging results that were available during my care of the patient were reviewed by me and considered in my medical decision making (see chart for  details).  Clinical Course as of Jan 21 1527  Thu Jan 20, 6557  4310 53 year old female with history of hypertension who has not been able to afford her meds here with elevated blood pressures and some headache.  She is nontoxic appearing and has a nonfocal exam.  Blood pressures are actually trending down here.  Checking some screening labs and likely will need some assistance in getting her medication.   [MB]    Clinical Course User Index [MB] Hayden Rasmussen, MD     Patient with a past medical history of hypertension, currently not compliant in medication presents to the ED with a pressure of 200s over 108, reports she has been out of her blood pressure medication for several months, has been taking her boyfriend's losartan 50 mg prior to arrival.  Also endorses a headache, neuro exam is reassuring without any focal deficits.  Does report some blurry vision, does wear glasses but does like to use them.  Will monitor patient along with obtain blood to check for any signs of endorgan damage.  We will also attempt to get patient set up with better follow-up for her medical conditions.  3:27 PM spoke to social work Arbie Cookey, will attempt to consult case management to help patient with funding blood pressure medication.  Blood work was obtained to further evaluate patient's condition for any signs of endorgan damage.   BMP showed no electrolyte derangement, creatinine level is unremarkable.  CBC showed no leukocytosis, hemoglobin is within normal limits.  Chest x-ray showed no consolidation, pneumothorax, pleural effusion.   Patient does have an appointment established with [facility] on August 28 of this month.  We will provide her with her blood pressure medication prescription now, this will be printed as patient reports she would like to take this to a [facility] nearby.  Blood pressure has improved, last reading was 144/81.  Patient provided with a month worth of blood pressure medication.  EKG without any changes of ischemia.  Patient stable for discharge.   Portions of this note were generated with Lobbyist. Dictation errors may occur despite best attempts at proofreading.  Final Clinical Impressions(s) / ED Diagnoses   Final diagnoses:  Essential hypertension    ED Discharge Orders    None       Janeece Fitting, Hershal Coria 01/21/19 1542    Hayden Rasmussen, MD 01/21/19 1726

## 2019-01-21 NOTE — ED Triage Notes (Signed)
Pt endorses hypertension/ha, denies any other sx. Pt ran out of her BP meds sometime ago and never got meds refilled due to loss of insurance. Axox4. Ambulatory. Took one of her Boyfriend's losaartan this morning. BP 208/109 in triage.

## 2019-01-21 NOTE — Discharge Instructions (Addendum)
Your laboratory results are within normal limits today.  An appointment has been scheduled for you at [facility] 8/28 in order to obtain further follow-up for your medical conditions such as high blood pressure.  If you experience any chest pain, shortness of breath or worsening symptoms please return to the emergency department.

## 2019-01-21 NOTE — ED Notes (Signed)
Pt ambulated with steady gait in room. Alert and oriented x4.

## 2019-01-21 NOTE — Progress Notes (Signed)
CSW received call stating that this patient needed assistance obtaining her medications. CSW notified Edwin Cap, RN CM to request her assistance.  Madilyn Fireman, MSW, LCSW-A Clinical Social Worker [facility] [phone]

## 2019-02-05 ENCOUNTER — Ambulatory Visit (INDEPENDENT_AMBULATORY_CARE_PROVIDER_SITE_OTHER): Payer: Self-pay | Admitting: Primary Care

## 2019-02-05 ENCOUNTER — Encounter (INDEPENDENT_AMBULATORY_CARE_PROVIDER_SITE_OTHER): Payer: Self-pay | Admitting: Primary Care

## 2019-02-05 ENCOUNTER — Other Ambulatory Visit: Payer: Self-pay

## 2019-02-05 VITALS — BP 142/98 | HR 69 | Temp 98.2°F | Ht 68.0 in | Wt 303.2 lb

## 2019-02-05 DIAGNOSIS — Z1211 Encounter for screening for malignant neoplasm of colon: Secondary | ICD-10-CM

## 2019-02-05 DIAGNOSIS — I10 Essential (primary) hypertension: Secondary | ICD-10-CM

## 2019-02-05 DIAGNOSIS — M25561 Pain in right knee: Secondary | ICD-10-CM

## 2019-02-05 DIAGNOSIS — Z09 Encounter for follow-up examination after completed treatment for conditions other than malignant neoplasm: Secondary | ICD-10-CM

## 2019-02-05 DIAGNOSIS — Z720 Tobacco use: Secondary | ICD-10-CM

## 2019-02-05 DIAGNOSIS — F32A Depression, unspecified: Secondary | ICD-10-CM

## 2019-02-05 DIAGNOSIS — G8929 Other chronic pain: Secondary | ICD-10-CM

## 2019-02-05 DIAGNOSIS — Z6841 Body Mass Index (BMI) 40.0 and over, adult: Secondary | ICD-10-CM

## 2019-02-05 DIAGNOSIS — E66813 Obesity, class 3: Secondary | ICD-10-CM

## 2019-02-05 DIAGNOSIS — Z114 Encounter for screening for human immunodeficiency virus [HIV]: Secondary | ICD-10-CM

## 2019-02-05 DIAGNOSIS — Z1159 Encounter for screening for other viral diseases: Secondary | ICD-10-CM

## 2019-02-05 DIAGNOSIS — F329 Major depressive disorder, single episode, unspecified: Secondary | ICD-10-CM

## 2019-02-05 DIAGNOSIS — F17210 Nicotine dependence, cigarettes, uncomplicated: Secondary | ICD-10-CM

## 2019-02-05 MED ORDER — LOSARTAN POTASSIUM-HCTZ 100-25 MG PO TABS: 1.0000 | ORAL_TABLET | Freq: Every day | ORAL | 0 refills | Status: DC

## 2019-02-05 MED ORDER — AMLODIPINE BESYLATE 10 MG PO TABS: 10.0000 mg | ORAL_TABLET | Freq: Every day | ORAL | 0 refills | Status: DC

## 2019-02-05 MED FILL — LOSARTAN-HCTZ 100-25 MG TAB: 100-25 | 30 days supply | Qty: 30 | Fill #0

## 2019-02-05 MED FILL — AMLODIPINE BESYLATE 10 MG T: 10 | 30 days supply | Qty: 30 | Fill #0

## 2019-02-05 NOTE — Progress Notes (Signed)
New Patient Office Visit  Subjective:  Patient ID: Sharon Dyer, female    DOB: 1966/01/09  Age: 53 y.o. MRN: [id number]  CC: No chief complaint on file.   HPI [name] presents today history of hypertension who has not been able to afford her meds here with elevated blood pressures and some headache.  She is nontoxic appearing and has a nonfocal exam.  Blood pressures are actually trending down here.  Checking some screening labs and likely will need some assistance in getting her medication  Past Medical History:  Diagnosis Date  . Anxiety   . Arthritis    Patient verbalizes she has athritis to right ankle  . Dyspnea   . Hypertension   . Obesity     Past Surgical History:  Procedure Laterality Date  . ECTOPIC PREGNANCY SURGERY  X 2    Family History  Problem Relation Age of Onset  . Hypertension Mother     Social History   Socioeconomic History  . Marital status: Divorced    Spouse name: Not on file  . Number of children: Not on file  . Years of education: Not on file  . Highest education level: Not on file  Occupational History  . Not on file  Social Needs  . Financial resource strain: Not on file  . Food insecurity    Worry: Not on file    Inability: Not on file  . Transportation needs    Medical: Not on file    Non-medical: Not on file  Tobacco Use  . Smoking status: Current Every Day Smoker    Packs/day: 1.00    Years: 37.00    Pack years: 37.00    Types: Cigarettes  . Smokeless tobacco: Never Used  Substance and Sexual Activity  . Alcohol use: Yes    Comment: 12/22/2017 "maybe twice a year"  . Drug use: Never  . Sexual activity: Yes    Birth control/protection: None  Lifestyle  . Physical activity    Days per week: Not on file    Minutes per session: Not on file  . Stress: Not on file  Relationships  . Social Herbalist on phone: Not on file    Gets together: Not on file    Attends religious service: Not on file     Active member of club or organization: Not on file    Attends meetings of clubs or organizations: Not on file    Relationship status: Not on file  . Intimate partner violence    Fear of current or ex partner: Not on file    Emotionally abused: Not on file    Physically abused: Not on file    Forced sexual activity: Not on file  Other Topics Concern  . Not on file  Social History Narrative  . Not on file    ROS Review of Systems  Musculoskeletal: Positive for arthralgias.       Knee pain  All other systems reviewed and are negative.   Objective:   Today's Vitals: BP (!) 181/88 (BP Location: Right Arm, Patient Position: Sitting, Cuff Size: Large)   Pulse 69   Temp 98.2 F (36.8 C) (Tympanic)   Ht 5' 8"  (1.727 m)   Wt (!) 303 lb 3.2 oz (137.5 kg)   LMP 01/12/2015   SpO2 94%   BMI 46.10 kg/m   Physical Exam Vitals signs reviewed.  Constitutional:      Appearance: Normal  appearance.  HENT:     Head: Normocephalic.     Nose: Nose normal.  Eyes:     Extraocular Movements: Extraocular movements intact.     Pupils: Pupils are equal, round, and reactive to light.  Neck:     Musculoskeletal: Normal range of motion and neck supple.  Cardiovascular:     Rate and Rhythm: Normal rate and regular rhythm.     Pulses: Normal pulses.     Heart sounds: Normal heart sounds.  Pulmonary:     Effort: Pulmonary effort is normal.     Breath sounds: Normal breath sounds.  Abdominal:     General: Abdomen is flat. There is distension.     Palpations: Abdomen is soft.  Musculoskeletal: Normal range of motion.  Skin:    General: Skin is warm.  Neurological:     General: No focal deficit present.     Mental Status: She is alert.  Psychiatric:        Mood and Affect: Mood normal.     Assessment & Plan:   Diagnoses and all orders for this visit:  [name] presents to the ED with complaint of headache daily.  She describes this headache as an ache worse on the  occipital region and some blurry vision. Unable to afford Bp medication taking her friends   -Ups: Follow up with [facility] ([facility]); appt scheduled for 02/05/2019 at 8:30 am to establish with a Primary Care Physician  -     CBC with Differential -     HIV Antibody (routine testing w rflx)  Essential hypertension Counseled on blood pressure goal of less than 130/80, low-sodium, DASH diet, medication compliance, 150 minutes of moderate intensity exercise per week. Discussed medication compliance, adverse effects. -     CMP14+EGFR  Depression, unspecified depression type Depression is when you feel down, blue or sad for at least 2 weeks in a row. You may experience increaset increase in sleeping, eating or  loss of interest in doing things that once gave you pleasure. Feeling worthless, guilty, nervous and low self esteem, avoiding interaction with other people or increase agitation.  Class 3 severe obesity due to excess calories with body mass index (BMI) of 40.0 to 44.9 in adult, unspecified whether serious comorbidity present (HCC) Morbid Obesity is BMI greater than 40 indicating an excess in caloric intake or underlining conditions. This may lead to other co-morbidities. Lifestyle modifications of diet and exercise may reduce obesity.  -     Lipid Panel -     TSH + free T4  Chronic pain of right knee Work on losing weight to help reduce joint pain. May alternate with heat and ice application for pain relief. May also alternate with acetaminophen and Ibuprofen as prescribed pain relief. Other alternatives include massage, acupuncture and water aerobics.  You must stay active and avoid a sedentary lifestyle.  Tobacco abuse Nicotine affect every organ in the body second leading cause of death.  Increased risk for lung cancer and other respiratory diseases recommend cessation.  This will be reminded at each clinical visit.   Encounter for screening for  HIV/ Need for hepatitis C screening test Health care  gap  USPSTF  Recommended screening for 2020 Hepatitis C Virus Infection in Adolescents and Adults: Screening Grade B Recommended screening for 2019 Human Immunodeficiency Virus (HIV) Infection: Screening Grade A Adolescent, Adult, Senior Category Infectious Disease   -     Hepatitis C Antibody  Special screening for malignant neoplasms, colon Normal colon cancer screening.  CDC recommends colorectal screening from ages 64-75. Screening can begin at 79 or earlier in some cases. This screening is used for a disease when no symptoms are present . Diagnostic test is used for symptoms examples blood in stool, colorectal polyps or coloector cancer, family history or inflammatory bowel disease - chron's or ulcerative colitis .(USPSTF) -     Fecal occult blood, imunochemical; Future  Other orders -     amLODipine (NORVASC) 10 MG tablet; Take 1 tablet (10 mg total) by mouth daily. -     Discontinue: losartan-hydrochlorothiazide (HYZAAR) 100-25 MG tablet; Take 1 tablet by mouth daily. -     losartan-hydrochlorothiazide (HYZAAR) 100-25 MG tablet; Take 1 tablet by mouth daily.      Outpatient Encounter Medications as of 02/05/2019  Medication Sig  . acetaminophen (TYLENOL) 500 MG tablet Take 500 mg by mouth every 6 (six) hours as needed for moderate pain.  Marland Kitchen losartan-hydrochlorothiazide (HYZAAR) 100-25 MG tablet Take 1 tablet by mouth daily.  . [DISCONTINUED] losartan-hydrochlorothiazide (HYZAAR) 100-25 MG tablet Take 1 tablet by mouth daily.  Marland Kitchen amLODipine (NORVASC) 10 MG tablet Take 1 tablet (10 mg total) by mouth daily.  Marland Kitchen buPROPion (WELLBUTRIN XL) 150 MG 24 hr tablet Take 1 tablet (150 mg total) by mouth daily.  . pantoprazole (PROTONIX) 20 MG tablet Take 1 tablet (20 mg total) by mouth daily.   No facility-administered encounter medications on file as of 02/05/2019.     Follow-up: Return in about 3 weeks (around 02/26/2019) for Bp check  added new Bp med.   Kerin Perna, NP

## 2019-02-05 NOTE — Patient Instructions (Signed)

## 2019-02-06 LAB — CBC WITH DIFFERENTIAL/PLATELET
Basophils Absolute: 0 10*3/uL (ref 0.0–0.2)
Basos: 0 %
EOS (ABSOLUTE): 0.2 10*3/uL (ref 0.0–0.4)
Eos: 2 %
Hematocrit: 41.4 % (ref 34.0–46.6)
Hemoglobin: 13.8 g/dL (ref 11.1–15.9)
Immature Grans (Abs): 0 10*3/uL (ref 0.0–0.1)
Immature Granulocytes: 0 %
Lymphocytes Absolute: 2.4 10*3/uL (ref 0.7–3.1)
Lymphs: 25 %
MCH: 28.6 pg (ref 26.6–33.0)
MCHC: 33.3 g/dL (ref 31.5–35.7)
MCV: 86 fL (ref 79–97)
Monocytes Absolute: 1 10*3/uL — ABNORMAL HIGH (ref 0.1–0.9)
Monocytes: 11 %
Neutrophils Absolute: 5.8 10*3/uL (ref 1.4–7.0)
Neutrophils: 62 %
Platelets: 193 10*3/uL (ref 150–450)
RBC: 4.83 x10E6/uL (ref 3.77–5.28)
RDW: 13.2 % (ref 11.7–15.4)
WBC: 9.5 10*3/uL (ref 3.4–10.8)

## 2019-02-06 LAB — CMP14+EGFR
ALT: 11 IU/L (ref 0–32)
AST: 13 IU/L (ref 0–40)
Albumin/Globulin Ratio: 1.3 (ref 1.2–2.2)
Albumin: 4.1 g/dL (ref 3.8–4.9)
Alkaline Phosphatase: 69 IU/L (ref 39–117)
BUN/Creatinine Ratio: 21 (ref 9–23)
BUN: 16 mg/dL (ref 6–24)
Bilirubin Total: 0.2 mg/dL (ref 0.0–1.2)
CO2: 24 mmol/L (ref 20–29)
Calcium: 9.2 mg/dL (ref 8.7–10.2)
Chloride: 100 mmol/L (ref 96–106)
Creatinine, Ser: 0.77 mg/dL (ref 0.57–1.00)
GFR calc Af Amer: 101 mL/min/{1.73_m2} (ref >59)
GFR calc non Af Amer: 87 mL/min/{1.73_m2} (ref >59)
Globulin, Total: 3.2 g/dL (ref 1.5–4.5)
Glucose: 78 mg/dL (ref 65–99)
Potassium: 4.1 mmol/L (ref 3.5–5.2)
Sodium: 139 mmol/L (ref 134–144)
Total Protein: 7.3 g/dL (ref 6.0–8.5)

## 2019-02-06 LAB — TSH+FREE T4
Free T4: 1.41 ng/dL (ref 0.82–1.77)
TSH: 1.24 u[IU]/mL (ref 0.450–4.500)

## 2019-02-06 LAB — LIPID PANEL
Chol/HDL Ratio: 4.2 ratio (ref 0.0–4.4)
Cholesterol, Total: 186 mg/dL (ref 100–199)
HDL: 44 mg/dL (ref >39)
LDL Calculated: 123 mg/dL — ABNORMAL HIGH (ref 0–99)
Triglycerides: 95 mg/dL (ref 0–149)
VLDL Cholesterol Cal: 19 mg/dL (ref 5–40)

## 2019-02-06 LAB — HIV ANTIBODY (ROUTINE TESTING W REFLEX): HIV Screen 4th Generation wRfx: NONREACTIVE

## 2019-02-06 LAB — HEPATITIS C ANTIBODY: Hep C Virus Ab: 0.1 s/co ratio (ref 0.0–0.9)

## 2019-02-19 ENCOUNTER — Encounter (INDEPENDENT_AMBULATORY_CARE_PROVIDER_SITE_OTHER): Payer: Self-pay

## 2019-02-26 ENCOUNTER — Other Ambulatory Visit: Payer: Self-pay

## 2019-02-26 ENCOUNTER — Ambulatory Visit (INDEPENDENT_AMBULATORY_CARE_PROVIDER_SITE_OTHER): Payer: Self-pay | Admitting: Primary Care

## 2019-02-26 ENCOUNTER — Encounter (INDEPENDENT_AMBULATORY_CARE_PROVIDER_SITE_OTHER): Payer: Self-pay | Admitting: Primary Care

## 2019-02-26 VITALS — BP 128/84 | HR 77 | Temp 98.1°F | Ht 68.0 in | Wt 303.4 lb

## 2019-02-26 DIAGNOSIS — Z1211 Encounter for screening for malignant neoplasm of colon: Secondary | ICD-10-CM

## 2019-02-26 DIAGNOSIS — M25561 Pain in right knee: Secondary | ICD-10-CM

## 2019-02-26 DIAGNOSIS — G8929 Other chronic pain: Secondary | ICD-10-CM

## 2019-02-26 DIAGNOSIS — I10 Essential (primary) hypertension: Secondary | ICD-10-CM

## 2019-02-26 DIAGNOSIS — Z720 Tobacco use: Secondary | ICD-10-CM

## 2019-02-26 DIAGNOSIS — Z6841 Body Mass Index (BMI) 40.0 and over, adult: Secondary | ICD-10-CM

## 2019-02-26 DIAGNOSIS — F17200 Nicotine dependence, unspecified, uncomplicated: Secondary | ICD-10-CM

## 2019-02-26 DIAGNOSIS — M25562 Pain in left knee: Secondary | ICD-10-CM

## 2019-02-26 MED ORDER — AMLODIPINE BESYLATE 10 MG PO TABS: 10.0000 mg | ORAL_TABLET | Freq: Every day | ORAL | 0 refills | Status: DC

## 2019-02-26 NOTE — Progress Notes (Signed)
Established Patient Office Visit  Subjective:  Patient ID: Sharon Dyer, female    DOB: 1965/06/29  Age: 53 y.o. MRN: [id number]  CC:  Chief Complaint  Patient presents with  . Hypertension  . Knee Pain    HPI Sharon Dyer presents for  Bp follow up on previous visit added a amlodipine. Today Bp is unremakable  128/84. She denies shortness of breath, headaches, chest pain or lower extremity edema. Complains of bilateral knee pain the pain alternates from one to another.  Past Medical History:  Diagnosis Date  . Anxiety   . Arthritis    Patient verbalizes she has athritis to right ankle  . Dyspnea   . Hypertension   . Obesity     Past Surgical History:  Procedure Laterality Date  . ECTOPIC PREGNANCY SURGERY  X 2    Family History  Problem Relation Age of Onset  . Hypertension Mother     Social History   Socioeconomic History  . Marital status: Divorced    Spouse name: Not on file  . Number of children: Not on file  . Years of education: Not on file  . Highest education level: Not on file  Occupational History  . Not on file  Social Needs  . Financial resource strain: Not on file  . Food insecurity    Worry: Not on file    Inability: Not on file  . Transportation needs    Medical: Not on file    Non-medical: Not on file  Tobacco Use  . Smoking status: Current Every Day Smoker    Packs/day: 1.00    Years: 37.00    Pack years: 37.00    Types: Cigarettes  . Smokeless tobacco: Never Used  Substance and Sexual Activity  . Alcohol use: Yes    Comment: 12/22/2017 "maybe twice a year"  . Drug use: Never  . Sexual activity: Yes    Birth control/protection: None  Lifestyle  . Physical activity    Days per week: Not on file    Minutes per session: Not on file  . Stress: Not on file  Relationships  . Social Herbalist on phone: Not on file    Gets together: Not on file    Attends religious service: Not on file    Active member of club or  organization: Not on file    Attends meetings of clubs or organizations: Not on file    Relationship status: Not on file  . Intimate partner violence    Fear of current or ex partner: Not on file    Emotionally abused: Not on file    Physically abused: Not on file    Forced sexual activity: Not on file  Other Topics Concern  . Not on file  Social History Narrative  . Not on file    Outpatient Medications Prior to Visit  Medication Sig Dispense Refill  . acetaminophen (TYLENOL) 500 MG tablet Take 500 mg by mouth every 6 (six) hours as needed for moderate pain.    Marland Kitchen amLODipine (NORVASC) 10 MG tablet Take 1 tablet (10 mg total) by mouth daily. 30 tablet 0  . losartan-hydrochlorothiazide (HYZAAR) 100-25 MG tablet Take 1 tablet by mouth daily. 90 tablet 0  . buPROPion (WELLBUTRIN XL) 150 MG 24 hr tablet Take 1 tablet (150 mg total) by mouth daily. 30 tablet 0  . pantoprazole (PROTONIX) 20 MG tablet Take 1 tablet (20 mg total) by mouth daily. Reader  tablet 0   No facility-administered medications prior to visit.     No Known Allergies  ROS Review of Systems  Musculoskeletal: Positive for gait problem.       Bilateral knee pain crepitus   All other systems reviewed and are negative.     Objective:    Physical Exam  Constitutional: She is oriented to person, place, and time. She appears well-developed and well-nourished.  Neck: Neck supple.  Cardiovascular: Normal rate and regular rhythm.  Abdominal: Soft. Bowel sounds are normal. She exhibits distension.  Musculoskeletal: Normal range of motion.     Comments: Bilateral knee crepitus   Neurological: She is oriented to person, place, and time.  Skin: Skin is warm.  Psychiatric: She has a normal mood and affect.    BP 128/84 (BP Location: Left Arm, Patient Position: Sitting, Cuff Size: Large)   Pulse 77   Temp 98.1 F (36.7 C) (Oral)   Ht 5\' 8"  (1.727 m)   Wt (!) 303 lb 6.4 oz (137.6 kg)   LMP 01/12/2015   SpO2 95%   BMI  46.13 kg/m  Wt Readings from Last 3 Encounters:  02/26/19 (!) 303 lb 6.4 oz (137.6 kg)  02/05/19 (!) 303 lb 3.2 oz (137.5 kg)  01/29/18 286 lb 6 oz (129.9 kg)     Health Maintenance Due  Topic Date Due  . PAP SMEAR-Modifier  01/26/1985  . MAMMOGRAM  01/26/2014  . COLONOSCOPY  01/26/2014    There are no preventive care reminders to display for this patient.  Lab Results  Component Value Date   TSH 1.240 02/05/2019   Lab Results  Component Value Date   WBC 9.5 02/05/2019   HGB 13.8 02/05/2019   HCT 41.4 02/05/2019   MCV 86 02/05/2019   PLT 193 02/05/2019   Lab Results  Component Value Date   NA 139 02/05/2019   K 4.1 02/05/2019   CO2 24 02/05/2019   GLUCOSE 78 02/05/2019   BUN 16 02/05/2019   CREATININE 0.77 02/05/2019   BILITOT 0.2 02/05/2019   ALKPHOS 69 02/05/2019   AST 13 02/05/2019   ALT 11 02/05/2019   PROT 7.3 02/05/2019   ALBUMIN 4.1 02/05/2019   CALCIUM 9.2 02/05/2019   ANIONGAP 11 01/21/2019   Lab Results  Component Value Date   CHOL 186 02/05/2019   Lab Results  Component Value Date   HDL 44 02/05/2019   Lab Results  Component Value Date   LDLCALC 123 (H) 02/05/2019   Lab Results  Component Value Date   TRIG 95 02/05/2019   Lab Results  Component Value Date   CHOLHDL 4.2 02/05/2019   No results found for: HGBA1C  Past Medical History:  Diagnosis Date  . Anxiety   . Arthritis    Patient verbalizes she has athritis to right ankle  . Dyspnea   . Hypertension   . Obesity    Past Surgical History:  Procedure Laterality Date  . ECTOPIC PREGNANCY SURGERY  X 2    Current Outpatient Medications:  .  acetaminophen (TYLENOL) 500 MG tablet, Take 500 mg by mouth every 6 (six) hours as needed for moderate pain., Disp: , Rfl:  .  amLODipine (NORVASC) 10 MG tablet, Take 1 tablet (10 mg total) by mouth daily., Disp: 30 tablet, Rfl: 0 .  losartan-hydrochlorothiazide (HYZAAR) 100-25 MG tablet, Take 1 tablet by mouth daily., Disp: 90 tablet,  Rfl: 0 .  pantoprazole (PROTONIX) 20 MG tablet, Take 1 tablet (20 mg total) by  mouth daily., Disp: 30 tablet, Rfl: 0 .  buPROPion (WELLBUTRIN XL) 150 MG 24 hr tablet, Take 1 tablet (150 mg total) by mouth daily. (Patient not taking: Reported on 02/26/2019), Disp: 30 tablet, Rfl: 0 No Known Allergies  reports that she has been smoking cigarettes. She has a 37.00 pack-year smoking history. She has never used smokeless tobacco. She reports current alcohol use. She reports that she does not use drugs. Family History  Problem Relation Age of Onset  . Hypertension Mother    Luster LandsbergRenee was seen today for hypertension and knee pain.  Diagnoses and all orders for this visit:  Essential hypertension Blood pressure goal is meet. She  has improved with addition of amlodipine . She is on a  low-sodium, DASH diet, medication compliance, 150 minutes of moderate intensity exercise per week. Discussed medication compliance, adverse effects.  Special screening for malignant neoplasms, colon Normal colon cancer screening.  CDC recommends colorectal screening from ages 9250-75. Screening can begin at 50 or earlier in some cases. This screening is used for a disease when no symptoms are present . Diagnostic test is used for symptoms examples blood in stool, colorectal polyps or coloector cancer, family history or inflammatory bowel disease - chron's or ulcerative colitis .(USPSTF) -     Fecal occult blood, imunochemical  Tobacco abuse Ready to quit: No Counseling given: Yes Tobacco products contain nicotine.Triggers, that make the body feel stimulated and works on areas of the brain to make you feel good.  It is very difficult to stop smoking without help or medication.  Smoking can affect every organ in the body.  It is also primary cause of lung cancer and other respiratory diseases.  Morbid obesity (HCC) Obesity is 30-39 indicating an excess in caloric intake or underlining conditions. This may lead to other  co-morbidities. Lifestyle modifications of diet and exercise may reduce obesity.   Chronic pain of both knee Knee: Normal to inspection with no erythema or effusion or obvious bony abnormalities. Palpation normal with no warmth or joint line tenderness or patellar tenderness or condyle tenderness. ROM normal in flexion and extension and lower leg rotation. With crepitus present -     Ambulatory referral to Orthopedics

## 2019-02-26 NOTE — Patient Instructions (Addendum)
Place knee pain patient instructions here.  Steps to Quit Smoking Smoking tobacco is the leading cause of preventable death. It can affect almost every organ in the body. Smoking puts you and those around you at risk for developing many serious chronic diseases. Quitting smoking can be difficult, but it is one of the best things that you can do for your health. It is never too late to quit. How do I get ready to quit? When you decide to quit smoking, create a plan to help you succeed. Before you quit:  Pick a date to quit. Set a date within the next 2 weeks to give you time to prepare.  Write down the reasons why you are quitting. Keep this list in places where you will see it often.  Tell your family, friends, and co-workers that you are quitting. Support from your loved ones can make quitting easier.  Talk with your health care provider about your options for quitting smoking.  Find out what treatment options are covered by your health insurance.  Identify people, places, things, and activities that make you want to smoke (triggers). Avoid them. What first steps can I take to quit smoking?  Throw away all cigarettes at home, at work, and in your car.  Throw away smoking accessories, such as Set designerashtrays and lighters.  Clean your car. Make sure to empty the ashtray.  Clean your home, including curtains and carpets. What strategies can I use to quit smoking? Talk with your health care provider about combining strategies, such as taking medicines while you are also receiving in-person counseling. Using these two strategies together makes you more likely to succeed in quitting than if you used either strategy on its own.  If you are pregnant or breastfeeding, talk with your health care provider about finding counseling or other support strategies to quit smoking. Do not take medicine to help you quit smoking unless your health care provider tells you to do so. To quit smoking: Quit right  away  Quit smoking completely, instead of gradually reducing how much you smoke over a period of time. Research shows that stopping smoking right away is more successful than gradually quitting.  Attend in-person counseling to help you build problem-solving skills. You are more likely to succeed in quitting if you attend counseling sessions regularly. Even short sessions of 10 minutes can be effective. Take medicine You may take medicines to help you quit smoking. Some medicines require a prescription and some you can purchase over-the-counter. Medicines may have nicotine in them to replace the nicotine in cigarettes. Medicines may:  Help to stop cravings.  Help to relieve withdrawal symptoms. Your health care provider may recommend:  Nicotine patches, gum, or lozenges.  Nicotine inhalers or sprays.  Non-nicotine medicine that is taken by mouth. Find resources Find resources and support systems that can help you to quit smoking and remain smoke-free after you quit. These resources are most helpful when you use them often. They include:  Online chats with a Veterinary surgeoncounselor.  Telephone quitlines.  Printed Materials engineerself-help materials.  Support groups or group counseling.  Text messaging programs.  Mobile phone apps or applications. Use apps that can help you stick to your quit plan by providing reminders, tips, and encouragement. There are many free apps for mobile devices as well as websites. Examples include Quit Guide from the [employer] and smokefree.gov What things can I do to make it easier to quit?   Reach out to your family and friends for support  and encouragement. Call telephone quitlines (1-800-QUIT-NOW), reach out to support groups, or work with a counselor for support.  Ask people who smoke to avoid smoking around you.  Avoid places that trigger you to smoke, such as bars, parties, or smoke-break areas at work.  Spend time with people who do not smoke.  Lessen the stress in your life.  Stress can be a smoking trigger for some people. To lessen stress, try: ? Exercising regularly. ? Doing deep-breathing exercises. ? Doing yoga. ? Meditating. ? Performing a body scan. This involves closing your eyes, scanning your body from head to toe, and noticing which parts of your body are particularly tense. Try to relax the muscles in those areas. How will I feel when I quit smoking? Day 1 to 3 weeks Within the first 24 hours of quitting smoking, you may start to feel withdrawal symptoms. These symptoms are usually most noticeable 2-3 days after quitting, but they usually do not last for more than 2-3 weeks. You may experience these symptoms:  Mood swings.  Restlessness, anxiety, or irritability.  Trouble concentrating.  Dizziness.  Strong cravings for sugary foods and nicotine.  Mild weight gain.  Constipation.  Nausea.  Coughing or a sore throat.  Changes in how the medicines that you take for unrelated issues work in your body.  Depression.  Trouble sleeping (insomnia). Week 3 and afterward After the first 2-3 weeks of quitting, you may start to notice more positive results, such as:  Improved sense of smell and taste.  Decreased coughing and sore throat.  Slower heart rate.  Lower blood pressure.  Clearer skin.  The ability to breathe more easily.  Fewer sick days. Quitting smoking can be very challenging. Do not get discouraged if you are not successful the first time. Some people need to make many attempts to quit before they achieve long-term success. Do your best to stick to your quit plan, and talk with your health care provider if you have any questions or concerns. Summary  Smoking tobacco is the leading cause of preventable death. Quitting smoking is one of the best things that you can do for your health.  When you decide to quit smoking, create a plan to help you succeed.  Quit smoking right away, not slowly over a period of time.  When  you start quitting, seek help from your health care provider, family, or friends. This information is not intended to replace advice given to you by your health care provider. Make sure you discuss any questions you have with your health care provider. Document Released: 05/21/2001 Document Revised: 08/14/2018 Document Reviewed: 08/15/2018 Elsevier Patient Education  2020 [employer].

## 2019-02-27 LAB — FECAL OCCULT BLOOD, IMMUNOCHEMICAL: Fecal Occult Bld: NEGATIVE

## 2019-03-10 ENCOUNTER — Ambulatory Visit: Payer: Medicaid Other

## 2019-03-11 MED FILL — AMLODIPINE BESYLATE 10 MG T: 10 | 30 days supply | Qty: 30 | Fill #0

## 2019-03-23 ENCOUNTER — Ambulatory Visit: Payer: Self-pay | Attending: Family Medicine

## 2019-03-23 ENCOUNTER — Other Ambulatory Visit: Payer: Self-pay

## 2019-03-29 MED FILL — LOSARTAN-HCTZ 100-25 MG TAB: 100-25 | 30 days supply | Qty: 30 | Fill #1

## 2019-04-20 MED FILL — AMLODIPINE BESYLATE 10 MG T: 10 | 30 days supply | Qty: 30 | Fill #1

## 2019-04-23 ENCOUNTER — Encounter: Payer: Self-pay | Admitting: Orthopedic Surgery

## 2019-04-23 ENCOUNTER — Ambulatory Visit (INDEPENDENT_AMBULATORY_CARE_PROVIDER_SITE_OTHER): Payer: Medicaid Other | Admitting: Orthopedic Surgery

## 2019-04-23 ENCOUNTER — Ambulatory Visit (INDEPENDENT_AMBULATORY_CARE_PROVIDER_SITE_OTHER): Payer: Self-pay

## 2019-04-23 ENCOUNTER — Ambulatory Visit: Payer: Self-pay

## 2019-04-23 ENCOUNTER — Other Ambulatory Visit: Payer: Self-pay

## 2019-04-23 VITALS — Ht 68.0 in | Wt 300.2 lb

## 2019-04-23 DIAGNOSIS — M25561 Pain in right knee: Secondary | ICD-10-CM

## 2019-04-23 DIAGNOSIS — M25562 Pain in left knee: Secondary | ICD-10-CM

## 2019-04-23 DIAGNOSIS — M170 Bilateral primary osteoarthritis of knee: Secondary | ICD-10-CM

## 2019-04-23 MED ORDER — LIDOCAINE HCL 1 % IJ SOLN
5.0000 mL | INTRAMUSCULAR | Status: AC | PRN
  Administered 2019-04-23: 5 mL

## 2019-04-23 MED ORDER — METHYLPREDNISOLONE ACETATE 40 MG/ML IJ SUSP
40.0000 mg | INTRAMUSCULAR | Status: AC | PRN
  Administered 2019-04-23: 40 mg via INTRA_ARTICULAR

## 2019-04-23 MED ORDER — BUPIVACAINE HCL 0.25 % IJ SOLN
4.0000 mL | INTRAMUSCULAR | Status: AC | PRN
  Administered 2019-04-23: 4 mL via INTRA_ARTICULAR

## 2019-04-23 MED ORDER — METHYLPREDNISOLONE ACETATE 40 MG/ML IJ SUSP
40.0000 mg | INTRAMUSCULAR | Status: AC | PRN
  Administered 2019-04-23: 23:00:00 40 mg via INTRA_ARTICULAR

## 2019-04-23 NOTE — Progress Notes (Signed)
Office Visit Note   Patient: Sharon Dyer           Date of Birth: 09-11-1965           MRN: [id number] Visit Date: 04/23/2019 Requested by: Kerin Perna, NP [address] [place],  [place] [address] PCP: Kerin Perna, NP  Subjective: Chief Complaint  Patient presents with  . Right Knee - Pain  . Left Knee - Pain    HPI: [name] is a patient with bilateral knee pain.  She reports chronic pain for a year.  She has good and bad days.  She has worse pain the day after any activity.  Does report swelling and weakness.  At times she has to walk with a cane.  Takes over-the-counter ibuprofen and Tylenol for symptoms.  She can walk for about 10 minutes but then has to stop.  Currently is not working.  She fell at work 2 years ago.              ROS: All systems reviewed are negative as they relate to the chief complaint within the history of present illness.  Patient denies  fevers or chills.   Assessment & Plan: Visit Diagnoses:  1. Pain in both knees, unspecified chronicity   2. Primary osteoarthritis of both knees     Plan: Impression is bilateral knee arthritis.  Difficult situation because the medial compartment arthritis is pretty severe in both knees.  She also has significantly increased body mass index weighing 300 pounds at 5 foot 8 inches.  This really precludes any type of reconstructive surgery for her.  We talked about factors within her control which she can adjust including exercising in a way that does not put load across the knees as well as getting the legs stronger in the quadriceps region as well as diet modification.  Both knees were injected today.  Could see her back in about 4 months for repeat injection if they give her enough relief that she feels like it is worthwhile.  Follow-Up Instructions: Return if symptoms worsen or fail to improve.   Orders:  Orders Placed This Encounter  Procedures  . XR Knee 1-2 Views Right  . XR KNEE 3 VIEW LEFT    No orders of the defined types were placed in this encounter.     Procedures: Large Joint Inj: bilateral knee on 04/23/2019 10:48 PM Indications: diagnostic evaluation, joint swelling and pain Details: 18 G 1.5 in needle, superolateral approach  Arthrogram: No  Medications (Right): 5 mL lidocaine 1 %; 4 mL bupivacaine 0.25 %; 40 mg methylPREDNISolone acetate 40 MG/ML Medications (Left): 5 mL lidocaine 1 %; 4 mL bupivacaine 0.25 %; 40 mg methylPREDNISolone acetate 40 MG/ML Outcome: tolerated well, no immediate complications Procedure, treatment alternatives, risks and benefits explained, specific risks discussed. Consent was given by the patient. Immediately prior to procedure a time out was called to verify the correct patient, procedure, equipment, support staff and site/side marked as required. Patient was prepped and draped in the usual sterile fashion.       Clinical Data: No additional findings.  Objective: Vital Signs: Ht 5\' 8"  (1.727 m)   Wt (!) 300 lb 3.2 oz (136.2 kg)   LMP 01/12/2015   BMI 45.65 kg/m   Physical Exam:   Constitutional: Patient appears well-developed HEENT:  Head: Normocephalic Eyes:EOM are normal Neck: Normal range of motion Cardiovascular: Normal rate Pulmonary/chest: Effort normal Neurologic: Patient is alert Skin: Skin is warm Psychiatric: Patient  has normal mood and affect    Ortho Exam: Ortho exam demonstrates increased body mass index with slight varus alignment bilateral lower extremities.  Pedal pulses palpable.  She does not really have an effusion in either knee.  Has pretty good motion from 5-1 15 of flexion.  Collateral and cruciate ligaments are stable.  Extensor mechanism is intact bilaterally.  No masses lymphadenopathy or skin changes noted in the knee region.  Specialty Comments:  No specialty comments available.  Imaging: Xr Knee 1-2 Views Right  Result Date: 04/23/2019 AP lateral merchant right knee reviewed.  Mild  varus alignment is present.  Severe end-stage tricompartmental arthritis is present worse in the medial compartment.  No acute fracture or dislocation.  No loose bodies.  Xr Knee 3 View Left  Result Date: 04/23/2019 AP lateral merchant left knee reviewed.  Severe end-stage tricompartmental arthritis is present but it is most prevalent in the medial compartment.  Lateral compartment has some spurring but the joint space does appear to be maintained there as well as within the patellofemoral compartment also with some spurring peripherally.  Mild varus alignment is present.  No acute fracture.    PMFS History: Patient Active Problem List   Diagnosis Date Noted  . AKI (acute kidney injury) ([facility]) 01/29/2018  . Hypokalemia 01/29/2018  . Respiratory distress 01/29/2018  . Tobacco abuse 01/29/2018  . Hypoxemia 12/22/2017   Past Medical History:  Diagnosis Date  . Anxiety   . Arthritis    Patient verbalizes she has athritis to right ankle  . Dyspnea   . Hypertension   . Obesity     Family History  Problem Relation Age of Onset  . Hypertension Mother     Past Surgical History:  Procedure Laterality Date  . ECTOPIC PREGNANCY SURGERY  X 2   Social History   Occupational History  . Not on file  Tobacco Use  . Smoking status: Current Every Day Smoker    Packs/day: 1.00    Years: 37.00    Pack years: 37.00    Types: Cigarettes  . Smokeless tobacco: Never Used  Substance and Sexual Activity  . Alcohol use: Yes    Comment: 12/22/2017 "maybe twice a year"  . Drug use: Never  . Sexual activity: Yes    Birth control/protection: None

## 2019-05-14 IMAGING — DX DG CHEST 1V PORT
1 series · 1 of 1 positions shown · non-contrast
Comparison: [DATE]

CLINICAL DATA: chief complaint of dyspnea that began around [DATE]. She reports a productive cough with yellow sputum. She is
current, every day smoker.

EXAM:
PORTABLE CHEST 1 VIEW

[chest]
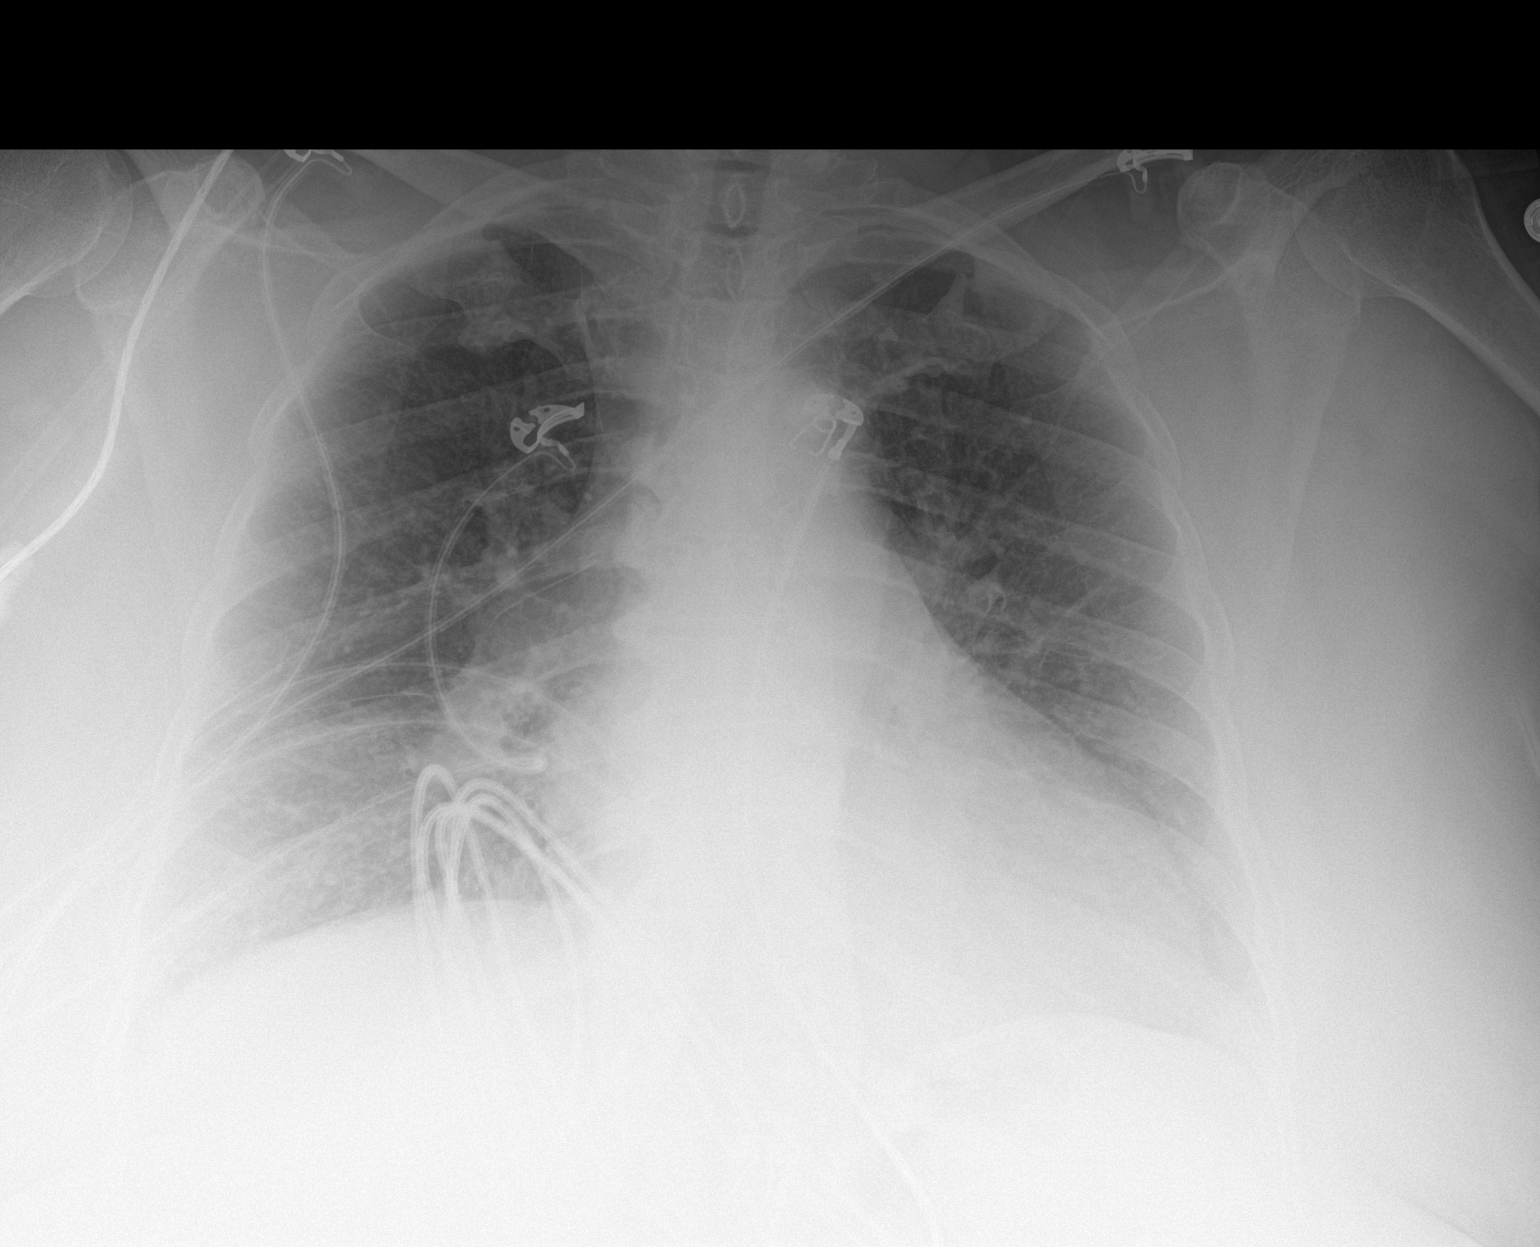

[1 of 1 positions shown; findings below may reference images not displayed]

FINDINGS: Cardiac silhouette is normal in size. No mediastinal or hilar
masses.

Lungs are clear.  No pleural effusion or pneumothorax.

Skeletal structures are grossly intact.
IMPRESSION: No active disease.

## 2019-05-14 IMAGING — CT CT ANGIO CHEST
2 of 8 series · 17 of 46 positions shown · IV contrast (APPLIED)
Comparison: Chest CT-12/17/2017

CLINICAL DATA: History of hypertension, now with shortness of
breath. Evaluate for pulmonary embolism.

EXAM:
CT ANGIOGRAPHY CHEST WITH CONTRAST
TECHNIQUE: Multidetector CT imaging of the chest was performed using the
standard protocol during bolus administration of intravenous
contrast. Multiplanar CT image reconstructions and MIPs were
obtained to evaluate the vascular anatomy.
CONTRAST:  100mL 3Q70V6-XYS IOPAMIDOL (3Q70V6-XYS) INJECTION 76%

[Series 6: thins · axial · 0.73mm/px · z∈[+1022,+1294]mm · 14 of 300 slices shown]
[im 14/300  lung]
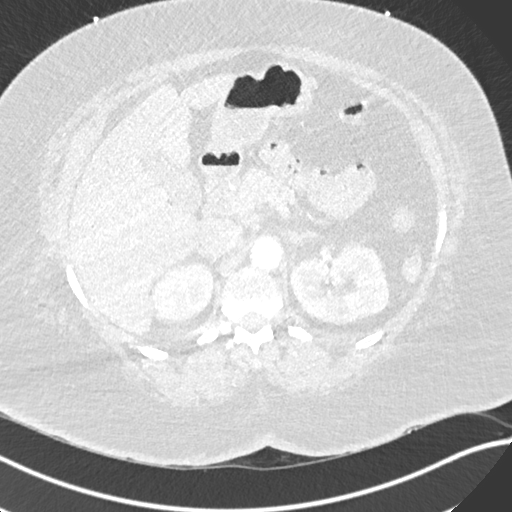
[im 41/300  soft-tissue]
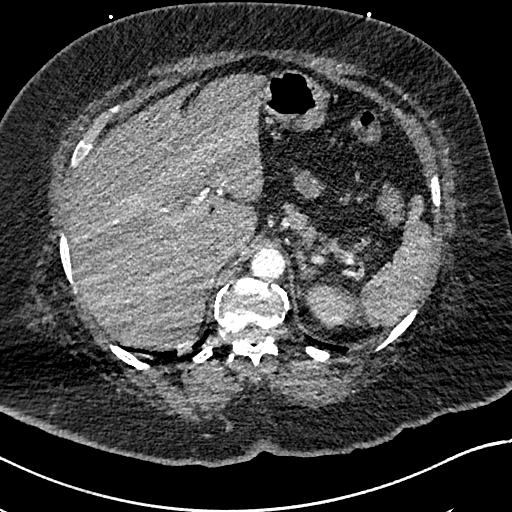
[im 55/300  lung]
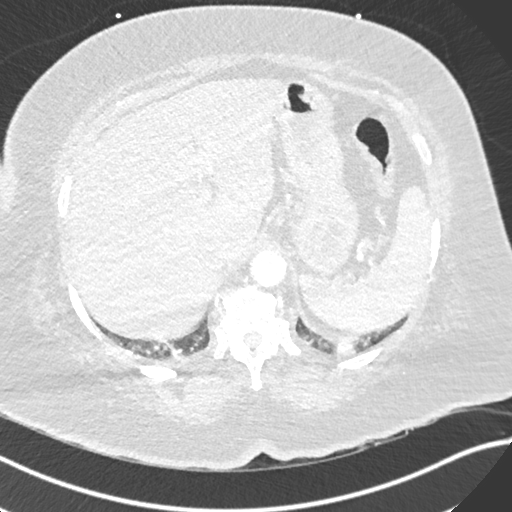
[im 82/300  soft-tissue]
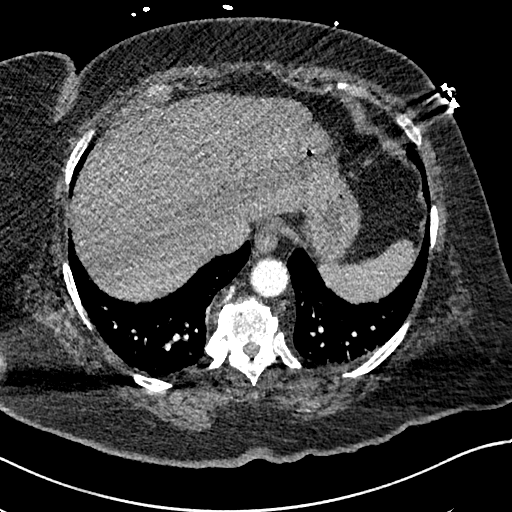
[im 96/300  lung]
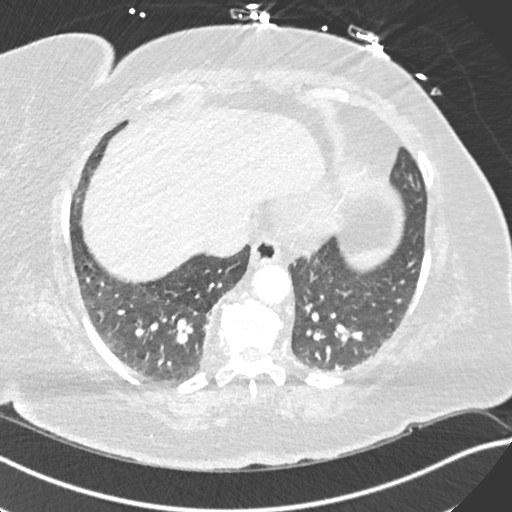
[im 123/300  soft-tissue]
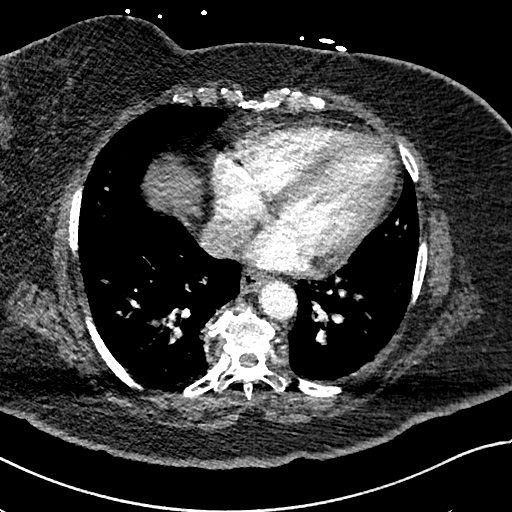
[im 136/300  lung]
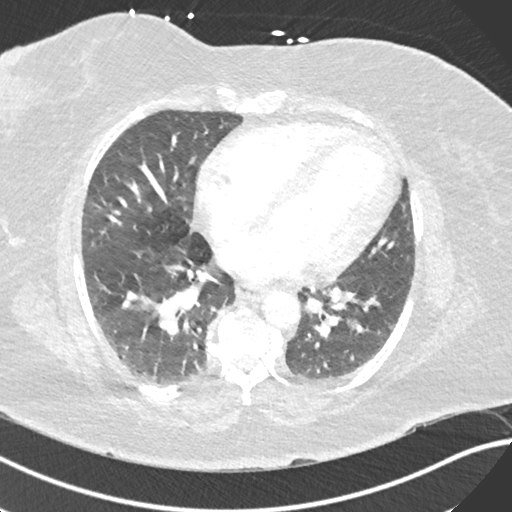
[im 164/300  soft-tissue]
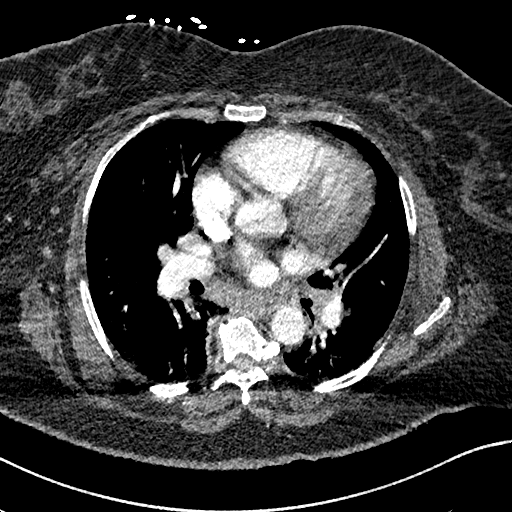
[im 177/300  lung]
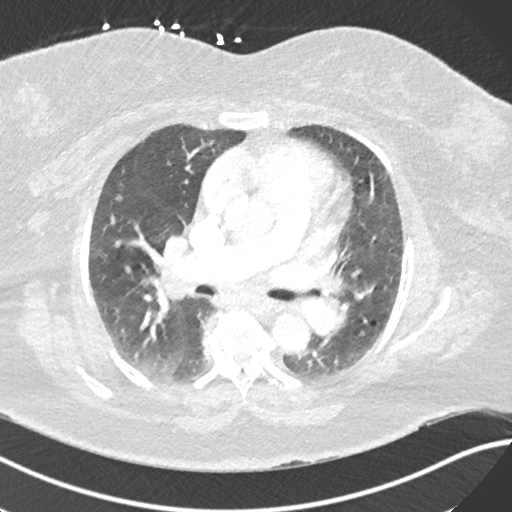
[im 204/300  soft-tissue]
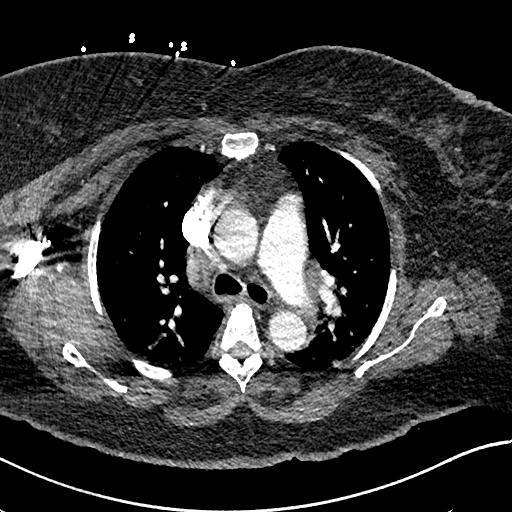
[im 218/300  lung]
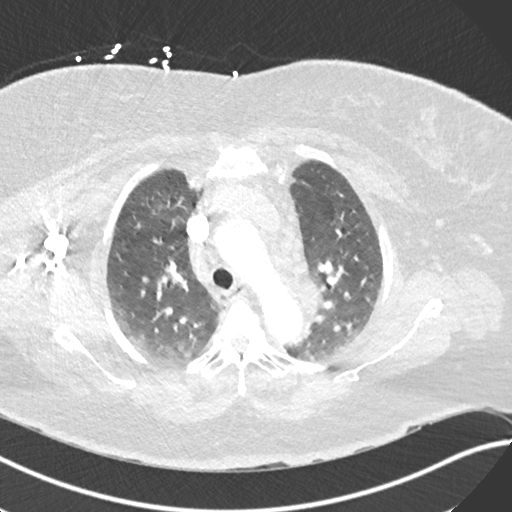
[im 245/300  soft-tissue]
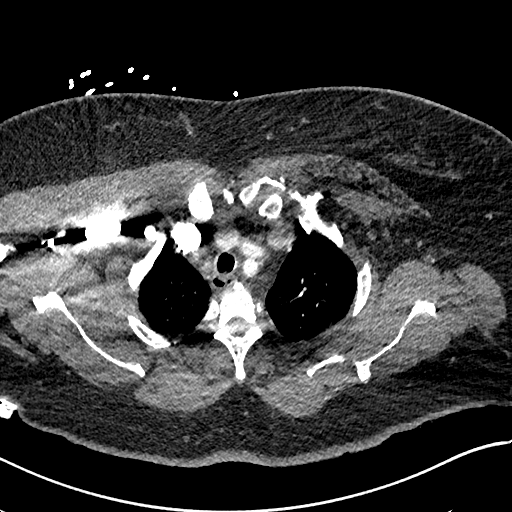
[im 259/300  lung]
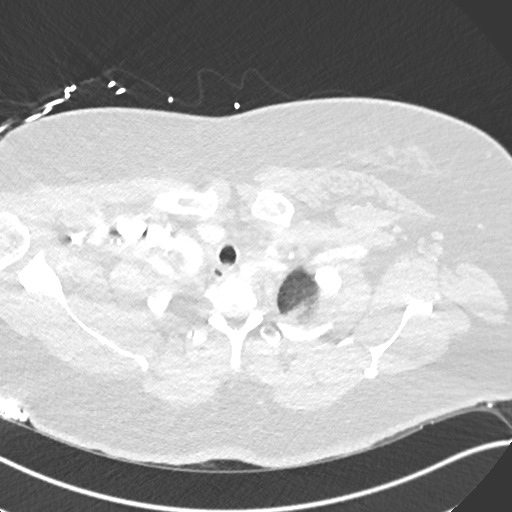
[im 286/300  soft-tissue]
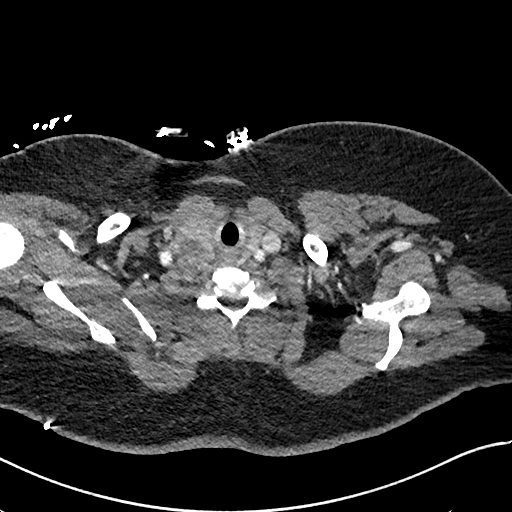

[Series 8: coronal mpr · coronal · 0.59mm/px · 3 of 151 slices shown]
[im 38/151  soft-tissue]
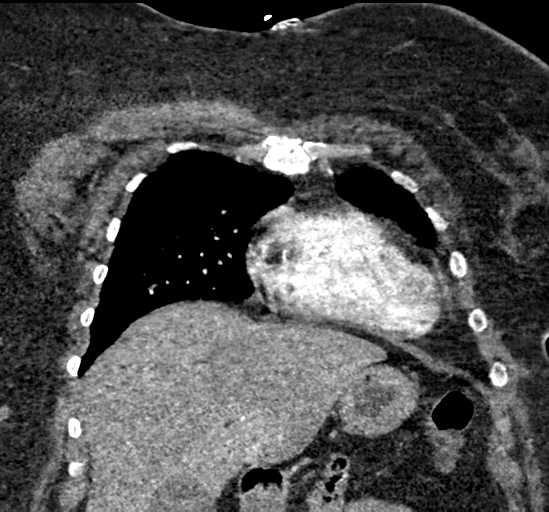
[im 76/151  soft-tissue]
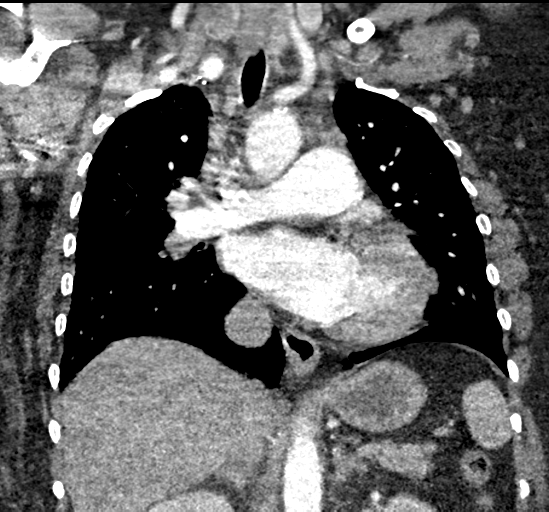
[im 113/151  soft-tissue]
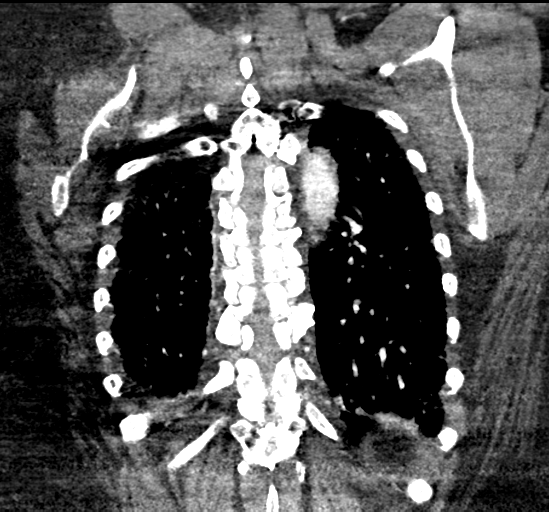

[17 of 46 positions shown; findings below may reference images not displayed]

FINDINGS: Vascular Findings:

There is adequate opacification of the pulmonary arterial system
with the main pulmonary artery measuring 248 Hounsfield units. There
are no discrete filling defects within the pulmonary arterial tree
to the level of the bilateral subsegmental pulmonary arteries.
Evaluation of the distal subsegmental pulmonary arteries is degraded
secondary to suboptimal vessel opacification as well as a
combination of patient respiratory artifact and quantum mottle
artifact due to patient body habitus. The main pulmonary artery is
enlarged measuring 3.6 cm in diameter (image 39, series 5).

Borderline cardiomegaly. Coronary artery calcifications. No
pericardial effusion. No evidence of thoracic aortic aneurysm or
dissection on this nongated examination. Atherosclerotic plaque
within the aortic arch, not resulting in hemodynamically significant
stenosis.

Bovine configuration of the aortic arch. The branch vessels of the
aortic arch appear widely patent throughout their imaged course.

Review of the MIP images confirms the above findings.

----------------------------------------------------------------------------------

Nonvascular Findings:

Mediastinum/Lymph Nodes: Grossly unchanged mediastinal and hilar
lymphadenopathy with index AP window lymph node measuring
approximately 1.4 cm in greatest short axis diameter (image 30,
series 5, index right hilar lymph node measuring 1.3 cm (image 42,
series 5) and index left hilar lymph nodes measuring approximately 1
cm and 1.7 cm in diameter respectively (images 46 and 40, series 5).
Bilateral axillary lymph nodes are not enlarged by size criteria.

Lungs/Pleura: Minimal dependent subpleural ground-glass atelectasis.
No discrete focal airspace opacities. No pleural effusion or
pneumothorax. The central pulmonary airways appear patent.

Unchanged punctate (approximately 0.5 cm) right upper lobe pulmonary
nodules (images 30 and 38, series 7).

Upper abdomen: Limited early arterial phase evaluation the upper
abdomen is normal.

Musculoskeletal: No acute or aggressive osseous abnormalities.
Stigmata of DISH within the thoracic spine. A note is made of an
approximately 1.9 x 1.6 cm hypoattenuating nodule within the right
lobe of the thyroid (image 5, series 5).
IMPRESSION: 1. No acute cardiopulmonary disease. Specifically, no evidence of
pulmonary embolism to the level the bilateral subsegmental pulmonary
arteries.
2. Unchanged 5 mm right upper lobe pulmonary nodules. No follow-up
needed if patient is low-risk (and has no known or suspected primary
neoplasm). Non-contrast chest CT can be considered in 12 months if
patient is high-risk. This recommendation follows the consensus
statement: Guidelines for Management of Incidental Pulmonary Nodules
Detected on CT Images: From the [HOSPITAL] 3294; Radiology
3294; [DATE].
3. Unchanged mediastinal and hilar lymphadenopathy, while
potentially reactive in etiology, inflammatory and malignant
etiologies are excluded on the basis of this examination. If not
previously performed, referral to pulmonary medicine could be
arranged as indicated.
4. Incidentally noted approximately 1.9 cm right-sided thyroid
nodule. Further evaluation with dedicated nonemergent thyroid
ultrasound could be performed as indicated.
5.  Aortic Atherosclerosis ([id number]).

## 2019-05-15 ENCOUNTER — Encounter (HOSPITAL_COMMUNITY): Payer: Self-pay

## 2019-05-15 ENCOUNTER — Ambulatory Visit (HOSPITAL_COMMUNITY)
Admission: EM | Admit: 2019-05-15 | Discharge: 2019-05-15 | Disposition: A | Discharge status: Home / Self Care | Payer: Self-pay | Attending: Family Medicine | Admitting: Family Medicine

## 2019-05-15 DIAGNOSIS — M170 Bilateral primary osteoarthritis of knee: Secondary | ICD-10-CM

## 2019-05-15 DIAGNOSIS — M7582 Other shoulder lesions, left shoulder: Secondary | ICD-10-CM

## 2019-05-15 DIAGNOSIS — M25551 Pain in right hip: Secondary | ICD-10-CM

## 2019-05-15 MED ORDER — PREDNISONE 5 MG PO TABS: ORAL_TABLET | ORAL | 0 refills | Status: DC

## 2019-05-15 NOTE — ED Provider Notes (Signed)
[facility]    CSN: [id number] Arrival date & time: 05/15/19  1434      History   Chief Complaint Chief Complaint  Patient presents with  . Hip Pain    right   . Shoulder Pain    left   . Ankle Pain    right    HPI Sharon Dyer is a 53 y.o. female.   She is presenting with right hip pain, left shoulder pain and knee pain.  The right hip pain has been ongoing for about a week.  The pain is over the lateral hip joint.  She denies any radicular symptoms.  The pain is been getting worse.  It seems to be worse if she lies on the affected side.  No improvement with ibuprofen and Tylenol.  She denies any specific inciting event.  She is also spearing seeing left shoulder pain.  Is localized to the shoulder.  Is worse with certain movements but is less severe than the hip.  It is mild to moderate.  It is sharp in nature.  She also has bilateral knee pain.  She has a history of osteoarthritis.  She received injections a few weeks ago but the pain in the right knee has returned.  The left knee is doing better.  She denies mechanical symptoms.  HPI  Past Medical History:  Diagnosis Date  . Anxiety   . Arthritis    Patient verbalizes she has athritis to right ankle  . Dyspnea   . Hypertension   . Obesity     Patient Active Problem List   Diagnosis Date Noted  . AKI (acute kidney injury) ([facility]) 01/29/2018  . Hypokalemia 01/29/2018  . Respiratory distress 01/29/2018  . Tobacco abuse 01/29/2018  . Hypoxemia 12/22/2017    Past Surgical History:  Procedure Laterality Date  . ECTOPIC PREGNANCY SURGERY  X 2    OB History   No obstetric history on file.      Home Medications    Prior to Admission medications   Medication Sig Start Date End Date Taking? Authorizing Provider  acetaminophen (TYLENOL) 500 MG tablet Take 500 mg by mouth every 6 (six) hours as needed for moderate pain.    [provider]  amLODipine (NORVASC) 10 MG tablet Take 1 tablet (10 mg  total) by mouth daily. 02/26/19   Kerin Perna, NP  buPROPion (WELLBUTRIN XL) 150 MG 24 hr tablet Take 1 tablet (150 mg total) by mouth daily. Patient not taking: Reported on 02/26/2019 01/30/18 03/01/18  Damita Lack, MD  losartan-hydrochlorothiazide (HYZAAR) 100-25 MG tablet Take 1 tablet by mouth daily. 02/05/19 03/07/19  Kerin Perna, NP  pantoprazole (PROTONIX) 20 MG tablet Take 1 tablet (20 mg total) by mouth daily. 01/30/18 02/26/19  Amin, Jeanella Flattery, MD  predniSONE (DELTASONE) 5 MG tablet Take 6 pills for first day, 5 pills second day, 4 pills third day, 3 pills fourth day, 2 pills the fifth day, and 1 pill sixth day. 05/15/19   Rosemarie Ax, MD    Family History Family History  Problem Relation Age of Onset  . Hypertension Mother     Social History Social History   Tobacco Use  . Smoking status: Current Every Day Smoker    Packs/day: 1.00    Years: 37.00    Pack years: 37.00    Types: Cigarettes  . Smokeless tobacco: Never Used  Substance Use Topics  . Alcohol use: Yes    Comment: 12/22/2017 "  maybe twice a year"  . Drug use: Never     Allergies   Patient has no known allergies.   Review of Systems Review of Systems  Constitutional: Negative for fever.  HENT: Negative for congestion.   Respiratory: Negative for cough.   Cardiovascular: Negative for chest pain.  Gastrointestinal: Negative for abdominal pain.  Musculoskeletal: Positive for arthralgias and back pain.  Skin: Negative for color change.  Neurological: Negative for weakness.  Hematological: Negative for adenopathy.     Physical Exam Triage Vital Signs ED Triage Vitals  Enc Vitals Group     BP 05/15/19 1535 138/66     Pulse Rate 05/15/19 1535 67     Resp 05/15/19 1535 18     Temp 05/15/19 1535 98.2 F (36.8 C)     Temp Source 05/15/19 1535 Oral     SpO2 05/15/19 1535 100 %     Weight --      Height --      Head Circumference --      Peak Flow --      Pain Score 05/15/19  1536 10     Pain Loc --      Pain Edu? --      Excl. in GC? --    No data found.  Updated Vital Signs BP 138/66 (BP Location: Right Arm)   Pulse 67   Temp 98.2 F (36.8 C) (Oral)   Resp 18   LMP 01/12/2015   SpO2 100%   Visual Acuity Right Eye Distance:   Left Eye Distance:   Bilateral Distance:    Right Eye Near:   Left Eye Near:    Bilateral Near:     Physical Exam Gen: NAD, alert, cooperative with exam, well-appearing ENT: normal lips, normal nasal mucosa,  Eye: normal EOM, normal conjunctiva and lids CV:  no edema, +2 pedal pulses   Resp: no accessory muscle use, non-labored,  Skin: no rashes, no areas of induration  Neuro: normal tone, normal sensation to touch Psych:  normal insight, alert and oriented MSK:  Right hip: No tenderness to palpation of the greater trochanter. Normal internal and external rotation of the hips. Normal strength resistance with hip flexion. Negative straight leg raise. Left shoulder: Normal active flexion abduction. Normal external rotation. Normal strength resistance. Neurovascularly intact  UC Treatments / Results  Labs (all labs ordered are listed, but only abnormal results are displayed) Labs Reviewed - No data to display  EKG   Radiology No results found.  Procedures Procedures (including critical care time)  Medications Ordered in UC Medications - No data to display  Initial Impression / Assessment and Plan / UC Course  I have reviewed the triage vital signs and the nursing notes.  Pertinent labs & imaging results that were available during my care of the patient were reviewed by me and considered in my medical decision making (see chart for details).     [name] is a 53 year old female is presenting with right hip pain and left shoulder pain.  The hip pain seems to be more related to greater trochanteric pain but no pain to palpation.  May be coming from her back or the actual hip joint itself.  Has good  range of motion.  Left shoulder appears to be more rotator cuff in nature.  Less likely for capsulitis.  Provided with prednisone and counseled on supportive care.  Give indications follow-up return.   Final Clinical Impressions(s) / UC Diagnoses   Final diagnoses:  Greater trochanteric pain syndrome of right lower extremity  Tendinitis of left rotator cuff  Primary osteoarthritis of both knees     Discharge Instructions     Please try heat and ice  You can continue the tylenol  Please try aspercreame with lidocaine  Please follow up if no improvement.     ED Prescriptions    Medication Sig Dispense Auth. Provider   predniSONE (DELTASONE) 5 MG tablet Take 6 pills for first day, 5 pills second day, 4 pills third day, 3 pills fourth day, 2 pills the fifth day, and 1 pill sixth day. 21 tablet Myra Rude, MD     PDMP not reviewed this encounter.   Myra Rude, MD 05/15/19 [phone]

## 2019-05-15 NOTE — ED Triage Notes (Signed)
Pt present right hip, left shoulder and right ankle pain, symptoms started 3-7 days ago. Pt denies having any injury to these limbs.

## 2019-05-15 NOTE — Discharge Instructions (Signed)
Please try heat and ice  You can continue the tylenol  Please try aspercreame with lidocaine  Please follow up if no improvement.

## 2019-05-19 ENCOUNTER — Ambulatory Visit (INDEPENDENT_AMBULATORY_CARE_PROVIDER_SITE_OTHER): Payer: Self-pay | Admitting: Primary Care

## 2019-05-28 ENCOUNTER — Encounter (INDEPENDENT_AMBULATORY_CARE_PROVIDER_SITE_OTHER): Payer: Self-pay | Admitting: Primary Care

## 2019-05-28 ENCOUNTER — Ambulatory Visit (INDEPENDENT_AMBULATORY_CARE_PROVIDER_SITE_OTHER): Payer: Medicaid Other | Admitting: Primary Care

## 2019-05-28 ENCOUNTER — Other Ambulatory Visit: Payer: Self-pay

## 2019-05-28 DIAGNOSIS — I10 Essential (primary) hypertension: Secondary | ICD-10-CM

## 2019-05-28 DIAGNOSIS — Z09 Encounter for follow-up examination after completed treatment for conditions other than malignant neoplasm: Secondary | ICD-10-CM

## 2019-05-28 DIAGNOSIS — M7582 Other shoulder lesions, left shoulder: Secondary | ICD-10-CM

## 2019-05-28 DIAGNOSIS — M25562 Pain in left knee: Secondary | ICD-10-CM

## 2019-05-28 DIAGNOSIS — G8929 Other chronic pain: Secondary | ICD-10-CM

## 2019-05-28 DIAGNOSIS — Z720 Tobacco use: Secondary | ICD-10-CM

## 2019-05-28 DIAGNOSIS — M25561 Pain in right knee: Secondary | ICD-10-CM

## 2019-05-28 MED ORDER — LOSARTAN POTASSIUM-HCTZ 100-25 MG PO TABS: 1.0000 | ORAL_TABLET | Freq: Every day | ORAL | 1 refills | Status: DC

## 2019-05-28 MED ORDER — AMLODIPINE BESYLATE 10 MG PO TABS: 10.0000 mg | ORAL_TABLET | Freq: Every day | ORAL | 1 refills | Status: DC

## 2019-05-28 MED FILL — LOSARTAN-HCTZ 100-25 MG TAB: 100-25 | 30 days supply | Qty: 30 | Fill #0

## 2019-05-28 MED FILL — ?AMLODIPINE BESYLATE 10 MG: 10 | 30 days supply | Qty: 30 | Fill #0

## 2019-05-28 NOTE — Progress Notes (Signed)
Virtual Visit via Telephone Note  I connected with Sharon Dyer on 05/28/19 at  8:30 AM EST by telephone and verified that I am speaking with the correct person using two identifiers.   I discussed the limitations, risks, security and privacy concerns of performing an evaluation and management service by telephone and the availability of in person appointments. I also discussed with the patient that there may be a patient responsible charge related to this service. The patient expressed understanding and agreed to proceed.   History of Present Illness: Sharon Dyer is having a tele visit for blood pressure follow-up and recent emergency room visit.  She complains of shoulder pain and hip pain.  Followed by Dr. Dorene Grebe orthopedist for bilateral arthritic knees.  Emergency room diagnose with tendinitis of the rotator cuff.  Blood pressure is well controlled on she denies shortness of breath, headaches, chest pain or lower extremity edema   Past Medical History:  Diagnosis Date  . Anxiety   . Arthritis    Patient verbalizes she has athritis to right ankle  . Dyspnea   . Hypertension   . Obesity    Current Outpatient Medications on File Prior to Visit  Medication Sig Dispense Refill  . acetaminophen (TYLENOL) 500 MG tablet Take 500 mg by mouth every 6 (six) hours as needed for moderate pain.    Marland Kitchen buPROPion (WELLBUTRIN XL) 150 MG 24 hr tablet Take 1 tablet (150 mg total) by mouth daily. (Patient not taking: Reported on 02/26/2019) 30 tablet 0  . pantoprazole (PROTONIX) 20 MG tablet Take 1 tablet (20 mg total) by mouth daily. 30 tablet 0   No current facility-administered medications on file prior to visit.   Observations/Objective: Review of Systems  Musculoskeletal: Positive for joint pain.       Shoulder, bilateral knees and hip   All other systems reviewed and are negative.   Assessment and Plan: Diagnoses and all orders for this visit:  Essential hypertension Discussed   blood  pressure  Is at goal of less than 130/80, well controlled on  Hyzaar 100/ 25 , amlodipine 10 mg both taken daily and low-sodium,medication compliance, 150 minutes of moderate intensity exercise per week. Discussed medication compliance, adverse effects.  Chronic pain of both knees Patient has osteoarthritis recently had bilateral injections from Dr. Wyn Forster continues to follow. Work on losing weight to help reduce joint pain. May alternate with heat and ice application for pain relief. May also alternate with acetaminophen and Ibuprofen as prescribed pain relief. Other alternatives include massage, acupuncture and water aerobics.  You must stay active and avoid a sedentary lifestyle.   Tobacco abuse Patient is aware of  increased risk for lung cancer and other respiratory diseases recommend cessation.  This will be reminded at each clinical visit.  [name] Sharon Dyer presented to the emergency room on 05/15/2019  with right hip pain, left shoulder pain and knee pain.  is been getting worse. Marland Kitchen  No improvement with ibuprofen and Tylenol.   Followed by ortho Dr. August Saucer   Tendinitis of left rotator cuff New diagnosis in emergency room placed on a steroid taper states feels "some what better" if problem persist will have Dr. August Saucer to evaluate.  Other orders -     amLODipine (NORVASC) 10 MG tablet; Take 1 tablet (10 mg total) by mouth daily. -     losartan-hydrochlorothiazide (HYZAAR) 100-25 MG tablet; Take 1 tablet by mouth daily.    Follow Up Instructions:  I discussed the assessment and treatment plan with the patient. The patient was provided an opportunity to ask questions and all were answered. The patient agreed with the plan and demonstrated an understanding of the instructions.   The patient was advised to call back or seek an in-person evaluation if the symptoms worsen or if the condition fails to improve as anticipated.  I provided 20 minutes of non-face-to-face  time during this encounter. Reviewed previous encounters and imaging    Kerin Perna, NP

## 2019-05-28 NOTE — Progress Notes (Signed)
BP 123/75 left arm

## 2019-07-02 MED FILL — AMLODIPINE BESYLATE 10 MG T: 10 | 30 days supply | Qty: 30 | Fill #1

## 2019-07-02 MED FILL — LOSARTAN-HCTZ 100-25 MG TAB: 100-25 | 30 days supply | Qty: 30 | Fill #1

## 2019-07-15 ENCOUNTER — Ambulatory Visit (INDEPENDENT_AMBULATORY_CARE_PROVIDER_SITE_OTHER): Payer: Self-pay | Admitting: Primary Care

## 2019-07-15 ENCOUNTER — Other Ambulatory Visit: Payer: Self-pay

## 2019-07-15 ENCOUNTER — Encounter (INDEPENDENT_AMBULATORY_CARE_PROVIDER_SITE_OTHER): Payer: Self-pay | Admitting: Primary Care

## 2019-07-15 DIAGNOSIS — M7582 Other shoulder lesions, left shoulder: Secondary | ICD-10-CM

## 2019-07-15 DIAGNOSIS — I10 Essential (primary) hypertension: Secondary | ICD-10-CM

## 2019-07-15 DIAGNOSIS — Z720 Tobacco use: Secondary | ICD-10-CM

## 2019-07-15 MED ORDER — AMLODIPINE BESYLATE 10 MG PO TABS: 10.0000 mg | ORAL_TABLET | Freq: Every day | ORAL | 1 refills | Status: DC

## 2019-07-15 MED ORDER — LOSARTAN POTASSIUM-HCTZ 100-25 MG PO TABS: 1.0000 | ORAL_TABLET | Freq: Every day | ORAL | 1 refills | Status: DC

## 2019-07-15 NOTE — Progress Notes (Signed)
Left shoulder pain. Cant lay and pain is worse when sleeping  Right hip pain for about one month pain not as bad as shoulder pain

## 2019-07-15 NOTE — Progress Notes (Signed)
Virtual Visit via Telephone Note  I connected with Sharon Dyer on 07/15/19 at  1:30 PM EST by telephone and verified that I am speaking with the correct person using two identifiers.   I discussed the limitations, risks, security and privacy concerns of performing an evaluation and management service by telephone and the availability of in person appointments. I also discussed with the patient that there may be a patient responsible charge related to this service. The patient expressed understanding and agreed to proceed.   History of Present Illness: [name] is having tele visit for left shoulder pain only able to raise 1/2 way up without increasing pain. Treated in the emergency room for Tendinitis of left rotator cuff with steroid taper. Following up on blood pressure also 132/82 which is stable. Refilled blood pressure medication. Past Medical History:  Diagnosis Date  . Anxiety   . Arthritis    Patient verbalizes she has athritis to right ankle  . Dyspnea   . Hypertension   . Obesity    Current Outpatient Medications on File Prior to Visit  Medication Sig Dispense Refill  . acetaminophen (TYLENOL) 500 MG tablet Take 500 mg by mouth every 6 (six) hours as needed for moderate pain.    . pantoprazole (PROTONIX) 20 MG tablet Take 1 tablet (20 mg total) by mouth daily. 30 tablet 0   No current facility-administered medications on file prior to visit.   Observations/Objective: Review of Systems  Musculoskeletal:       Left shoulder pain  All other systems reviewed and are negative.   Assessment and Plan: Sharon Dyer was seen today for shoulder pain and hip pain.  Diagnoses and all orders for this visit:  Essential hypertension Blood pressure stable on amlodipine 10 mg  and   losartan-hydrochlorothiazide (HYZAAR) 100-25 MG tablet; Take 1 tablet by mouth daily. Admits not to checking Bp regularly but takes her medication.  Tendinitis of left rotator cuff  Greater  trochanteric pain syndrome of right lower extremity Tendinitis of left rotator cuff Refer to orthopedics   Tobacco abuse She is well aware.increased risk for lung cancer and other respiratory diseases recommend cessation.  This will be reminded at each clinical visit.    Other orders -     amLODipine (NORVASC) 10 MG tablet; Take 1 tablet (10 mg total) by mouth daily.    Follow Up Instructions:    I discussed the assessment and treatment plan with the patient. The patient was provided an opportunity to ask questions and all were answered. The patient agreed with the plan and demonstrated an understanding of the instructions.   The patient was advised to call back or seek an in-person evaluation if the symptoms worsen or if the condition fails to improve as anticipated.  I provided 12 minutes of non-face-to-face time during this encounter.   Sharon Sessions, NP

## 2019-07-30 ENCOUNTER — Other Ambulatory Visit: Payer: Self-pay

## 2019-07-30 ENCOUNTER — Ambulatory Visit (INDEPENDENT_AMBULATORY_CARE_PROVIDER_SITE_OTHER): Payer: Self-pay | Admitting: Orthopedic Surgery

## 2019-07-30 ENCOUNTER — Encounter: Payer: Self-pay | Admitting: Orthopedic Surgery

## 2019-07-30 ENCOUNTER — Ambulatory Visit: Payer: Self-pay

## 2019-07-30 DIAGNOSIS — M7502 Adhesive capsulitis of left shoulder: Secondary | ICD-10-CM

## 2019-07-30 DIAGNOSIS — M7500 Adhesive capsulitis of unspecified shoulder: Secondary | ICD-10-CM

## 2019-07-30 DIAGNOSIS — M25512 Pain in left shoulder: Secondary | ICD-10-CM

## 2019-07-30 HISTORY — DX: Adhesive capsulitis of unspecified shoulder: M75.00

## 2019-07-30 MED ORDER — MELOXICAM 15 MG PO TABS: 15.0000 mg | ORAL_TABLET | Freq: Every day | ORAL | 0 refills | Status: DC

## 2019-07-30 MED FILL — MELOXICAM 15 MG TABLET: 15 | 30 days supply | Qty: 30 | Fill #0

## 2019-07-30 NOTE — Progress Notes (Signed)
Office Visit Note   Patient: Sharon Dyer           Date of Birth: 21-Jul-1965           MRN: [id number] Visit Date: 07/30/2019 Requested by: Grayce Sessions, NP [address] [place],  [place] [address] PCP: Grayce Sessions, NP  Subjective: Chief Complaint  Patient presents with  . Left Shoulder - Pain    HPI: Sharon Dyer is a 54 y.o. female who presents to the office complaining of left shoulder pain.  Patient notes left shoulder pain that began with an insidious onset about 2 months ago.  She denies any injury associated.  She states that she had gradual increase in pain and loss of function to the point where she cannot move her left shoulder above shoulder level as she can with her right shoulder.  She states that 3 months ago before this all began she had completely normal function of her left shoulder.  She has been taking Tylenol 500 mg with little relief.  Pain wakes her up at night.  Pain radiates down the arm into the forearm and into the axilla.  She was seen in urgent care and given a steroid Dosepak that was not helpful.  She has no history of shoulder surgery/MRI.  Denies any neck pain or numbness/tingling.  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  Patient also reports right lateral hip pain but denies any groin pain.  She also complains of a right middle finger trigger finger over the last year but states that this does not cause her significant distress.  She is right-hand dominant..                ROS:  All systems reviewed are negative as they relate to the chief complaint within the history of present illness.  Patient denies fevers or chills.  Assessment & Plan: Visit Diagnoses:  1. Left shoulder pain, unspecified chronicity   2. Adhesive capsulitis of left shoulder     Plan: Patient is a 54 year old female who presents complaining of left shoulder pain.  Patient notes insidious, gradual onset of left  shoulder pain.  No injury.  Left shoulder x-rays taken today reveal no acute pathology to explain her pain.  On exam she has significantly reduced passive range of motion of the left shoulder when compared with contralateral side.  She has excellent strength of the rotator cuff.  Impression is left frozen shoulder.  Discussed options available to patient.  After discussion, patient agreed with plan involving left glenohumeral cortisone injection with outpatient physical therapy to work on left shoulder range of motion.  Also prescribed Mobic 15 mg p.o. daily #30.  Plan for patient to return in 6 weeks for clinical recheck.  Consider second cortisone injection at that time if patient has little progress.  Patient agreed with plan.  Follow-up in 6 weeks.  Follow-Up Instructions: No follow-ups on file.   Orders:  Orders Placed This Encounter  Procedures  . XR Shoulder Left  . Ambulatory referral to Physical Therapy   Meds ordered this encounter  Medications  . meloxicam (MOBIC) 15 MG tablet    Sig: Take 1 tablet (15 mg total) by mouth daily.    Dispense:  30 tablet    Refill:  0      Procedures: Large Joint Inj: L glenohumeral on 08/05/2019 8:45 PM Indications: diagnostic evaluation and pain Details:  18 G 1.5 in needle, posterior approach  Arthrogram: No  Medications: 9 mL bupivacaine 0.5 %; 40 mg methylPREDNISolone acetate 40 MG/ML; 5 mL lidocaine 1 % Outcome: tolerated well, no immediate complications Procedure, treatment alternatives, risks and benefits explained, specific risks discussed. Consent was given by the patient. Immediately prior to procedure a time out was called to verify the correct patient, procedure, equipment, support staff and site/side marked as required. Patient was prepped and draped in the usual sterile fashion.       Clinical Data: No additional findings.  Objective: Vital Signs: LMP 01/12/2015   Physical Exam:  Constitutional: Patient appears  well-developed HEENT:  Head: Normocephalic Eyes:EOM are normal Neck: Normal range of motion Cardiovascular: Normal rate Pulmonary/chest: Effort normal Neurologic: Patient is alert Skin: Skin is warm Psychiatric: Patient has normal mood and affect  Ortho Exam:  Left shoulder Exam Left shoulder forward flexion and abduction to 90 degrees which is reduced compared with contralateral side Significantly reduced external rotation of the left shoulder to 15 to 20 degrees compared with 75 degrees on the contralateral side.  Unable to bring her left hand to touch her back. Good subscapularis, supraspinatus, and infraspinatus strength 5/5 grip strength, forearm pronation/supination, and bicep strength  Specialty Comments:  No specialty comments available.  Imaging: No results found.   PMFS History: Patient Active Problem List   Diagnosis Date Noted  . AKI (acute kidney injury) ([facility]) 01/29/2018  . Hypokalemia 01/29/2018  . Respiratory distress 01/29/2018  . Tobacco abuse 01/29/2018  . Hypoxemia 12/22/2017   Past Medical History:  Diagnosis Date  . Anxiety   . Arthritis    Patient verbalizes she has athritis to right ankle  . Dyspnea   . Hypertension   . Obesity     Family History  Problem Relation Age of Onset  . Hypertension Mother     Past Surgical History:  Procedure Laterality Date  . ECTOPIC PREGNANCY SURGERY  X 2   Social History   Occupational History  . Not on file  Tobacco Use  . Smoking status: Current Every Day Smoker    Packs/day: 1.00    Years: 37.00    Pack years: 37.00    Types: Cigarettes  . Smokeless tobacco: Never Used  Substance and Sexual Activity  . Alcohol use: Yes    Comment: 12/22/2017 "maybe twice a year"  . Drug use: Never  . Sexual activity: Yes    Birth control/protection: None

## 2019-08-05 DIAGNOSIS — M7502 Adhesive capsulitis of left shoulder: Secondary | ICD-10-CM

## 2019-08-05 MED ORDER — BUPIVACAINE HCL 0.5 % IJ SOLN
9.0000 mL | INTRAMUSCULAR | Status: AC | PRN
  Administered 2019-08-05: 21:00:00 9 mL via INTRA_ARTICULAR

## 2019-08-05 MED ORDER — METHYLPREDNISOLONE ACETATE 40 MG/ML IJ SUSP
40.0000 mg | INTRAMUSCULAR | Status: AC | PRN
  Administered 2019-08-05: 21:00:00 40 mg via INTRA_ARTICULAR

## 2019-08-05 MED ORDER — LIDOCAINE HCL 1 % IJ SOLN
5.0000 mL | INTRAMUSCULAR | Status: AC | PRN
  Administered 2019-08-05: 21:00:00 5 mL

## 2019-08-06 ENCOUNTER — Emergency Department (HOSPITAL_COMMUNITY)
Admission: EM | Admit: 2019-08-06 | Discharge: 2019-08-06 | Disposition: A | Discharge status: Home / Self Care | Payer: Self-pay | Attending: Emergency Medicine | Admitting: Emergency Medicine

## 2019-08-06 ENCOUNTER — Encounter (HOSPITAL_COMMUNITY): Payer: Self-pay | Admitting: Emergency Medicine

## 2019-08-06 ENCOUNTER — Emergency Department (HOSPITAL_COMMUNITY): Payer: Self-pay

## 2019-08-06 ENCOUNTER — Other Ambulatory Visit: Payer: Self-pay

## 2019-08-06 DIAGNOSIS — R519 Headache, unspecified: Secondary | ICD-10-CM | POA: Insufficient documentation

## 2019-08-06 DIAGNOSIS — R42 Dizziness and giddiness: Secondary | ICD-10-CM

## 2019-08-06 DIAGNOSIS — Z79899 Other long term (current) drug therapy: Secondary | ICD-10-CM | POA: Insufficient documentation

## 2019-08-06 DIAGNOSIS — R112 Nausea with vomiting, unspecified: Secondary | ICD-10-CM | POA: Insufficient documentation

## 2019-08-06 DIAGNOSIS — I10 Essential (primary) hypertension: Secondary | ICD-10-CM | POA: Insufficient documentation

## 2019-08-06 DIAGNOSIS — H538 Other visual disturbances: Secondary | ICD-10-CM | POA: Insufficient documentation

## 2019-08-06 DIAGNOSIS — F17210 Nicotine dependence, cigarettes, uncomplicated: Secondary | ICD-10-CM | POA: Insufficient documentation

## 2019-08-06 LAB — COMPREHENSIVE METABOLIC PANEL
ALT: 10 U/L (ref 0–44)
AST: 14 U/L — ABNORMAL LOW (ref 15–41)
Albumin: 3.2 g/dL — ABNORMAL LOW (ref 3.5–5.0)
Alkaline Phosphatase: 57 U/L (ref 38–126)
Anion gap: 11 (ref 5–15)
BUN: 12 mg/dL (ref 6–20)
CO2: 25 mmol/L (ref 22–32)
Calcium: 8.9 mg/dL (ref 8.9–10.3)
Chloride: 103 mmol/L (ref 98–111)
Creatinine, Ser: 0.6 mg/dL (ref 0.44–1.00)
GFR calc Af Amer: >60 mL/min (ref >60)
GFR calc non Af Amer: >60 mL/min (ref >60)
Glucose, Bld: 119 mg/dL — ABNORMAL HIGH (ref 70–99)
Potassium: 3.3 mmol/L — ABNORMAL LOW (ref 3.5–5.1)
Sodium: 139 mmol/L (ref 135–145)
Total Bilirubin: 0.3 mg/dL (ref 0.3–1.2)
Total Protein: 7 g/dL (ref 6.5–8.1)

## 2019-08-06 LAB — CBC WITH DIFFERENTIAL/PLATELET
Abs Immature Granulocytes: 0.09 10*3/uL — ABNORMAL HIGH (ref 0.00–0.07)
Basophils Absolute: 0.1 10*3/uL (ref 0.0–0.1)
Basophils Relative: 1 %
Eosinophils Absolute: 0.1 10*3/uL (ref 0.0–0.5)
Eosinophils Relative: 1 %
HCT: 41.5 % (ref 36.0–46.0)
Hemoglobin: 13.4 g/dL (ref 12.0–15.0)
Immature Granulocytes: 1 %
Lymphocytes Relative: 21 %
Lymphs Abs: 2.6 10*3/uL (ref 0.7–4.0)
MCH: 30.1 pg (ref 26.0–34.0)
MCHC: 32.3 g/dL (ref 30.0–36.0)
MCV: 93.3 fL (ref 80.0–100.0)
Monocytes Absolute: 1.1 10*3/uL — ABNORMAL HIGH (ref 0.1–1.0)
Monocytes Relative: 9 %
Neutro Abs: 8.4 10*3/uL — ABNORMAL HIGH (ref 1.7–7.7)
Neutrophils Relative %: 67 %
Platelets: 228 10*3/uL (ref 150–400)
RBC: 4.45 MIL/uL (ref 3.87–5.11)
RDW: 12.9 % (ref 11.5–15.5)
WBC: 12.3 10*3/uL — ABNORMAL HIGH (ref 4.0–10.5)
nRBC: 0 % (ref 0.0–0.2)

## 2019-08-06 LAB — CBG MONITORING, ED: Glucose-Capillary: 110 mg/dL — ABNORMAL HIGH (ref 70–99)

## 2019-08-06 MED ORDER — PROMETHAZINE HCL 25 MG/ML IJ SOLN
12.5000 mg | Freq: Once | INTRAMUSCULAR | Status: AC
  Administered 2019-08-06: 12.5 mg via INTRAVENOUS
  Filled 2019-08-06: qty 1

## 2019-08-06 MED ORDER — LORAZEPAM 2 MG/ML IJ SOLN
1.0000 mg | Freq: Once | INTRAMUSCULAR | Status: AC
  Administered 2019-08-06: 13:00:00 1 mg via INTRAVENOUS
  Filled 2019-08-06: qty 1

## 2019-08-06 MED ORDER — MECLIZINE HCL 25 MG PO TABS
25.0000 mg | ORAL_TABLET | Freq: Once | ORAL | Status: AC
  Administered 2019-08-06: 14:00:00 25 mg via ORAL
  Filled 2019-08-06: qty 1

## 2019-08-06 MED ORDER — MECLIZINE HCL 25 MG PO TABS: 25.0000 mg | ORAL_TABLET | Freq: Three times a day (TID) | ORAL | 0 refills | Status: DC | PRN

## 2019-08-06 MED ORDER — POTASSIUM CHLORIDE CRYS ER 20 MEQ PO TBCR
40.0000 meq | EXTENDED_RELEASE_TABLET | Freq: Once | ORAL | Status: AC
  Administered 2019-08-06: 14:00:00 40 meq via ORAL
  Filled 2019-08-06: qty 2

## 2019-08-06 MED ORDER — PROMETHAZINE HCL 25 MG PO TABS: 25.0000 mg | ORAL_TABLET | Freq: Four times a day (QID) | ORAL | 0 refills | Status: DC | PRN

## 2019-08-06 NOTE — ED Triage Notes (Signed)
Pt here from home via [facility] for sudden onset of dizziness and nausea that began an hour ago. Pt had a near syncopal upon standing after this began. Pt has hx of HTN. Dizziness and nausea have been on and off. VSS, aox4.

## 2019-08-06 NOTE — ED Notes (Signed)
Pt verbalized understanding of discharge instructions. Prescriptions and follow up care reviewed. Pt ambulated independently to lobby.

## 2019-08-06 NOTE — ED Provider Notes (Signed)
[facility] EMERGENCY DEPARTMENT Provider Note   CSN: [id number] Arrival date & time: 08/06/19  1147     History Chief Complaint  Patient presents with  . Dizziness  . Near Syncope    Sharon Dyer is a 54 y.o. female with significant PMH of hypertension, obesity, tobacco use disorder, and L frozen shoulder, who presents for sudden onset dizziness this morning. Pt states she was in her usual state of health this morning (drinking her morning coffee and having a cigarette), when she began having difficulty looking at her phone secondary to dizziness and vision changes. She waited a few minutes and the sensation did not go away. Pt felt so dizzy and lightheaded that she slid out of her chair and onto the floor. Upon standing, pt was weakness, lightheaded, and thought she was going to pass out so she called EMS. Endorses feeling like "things are shaking" when she looks at them, dizzines/room spinning, nausea, and vomiting. Denies focal weakness, sensation changes, difficulty speaking, or vision loss. Says her dizziness and vision changes are waxing and waning while in the ED.  The history is provided by the patient.  Dizziness Quality:  Room spinning and lightheadedness Onset quality:  Sudden Duration:  2 hours Timing:  Intermittent Progression:  Waxing and waning Chronicity:  New Relieved by:  Nothing Worsened by:  Standing up Ineffective treatments:  Being still, closing eyes and change in position Associated symptoms: headaches, nausea, vision changes and vomiting   Associated symptoms: no chest pain, no diarrhea, no hearing loss, no palpitations, no shortness of breath, no tinnitus and no weakness   Risk factors: no hx of stroke, no hx of vertigo, no Meniere's disease and no new medications   Near Syncope Associated symptoms include headaches. Pertinent negatives include no chest pain, no abdominal pain and no shortness of breath.      Past Medical History:    Diagnosis Date  . Anxiety   . Arthritis    Patient verbalizes she has athritis to right ankle  . Dyspnea   . Frozen shoulder syndrome 07/30/2019  . Hypertension   . Obesity     Patient Active Problem List   Diagnosis Date Noted  . AKI (acute kidney injury) ([facility]) 01/29/2018  . Hypokalemia 01/29/2018  . Respiratory distress 01/29/2018  . Tobacco abuse 01/29/2018  . Hypoxemia 12/22/2017    Past Surgical History:  Procedure Laterality Date  . ECTOPIC PREGNANCY SURGERY  X 2     OB History   No obstetric history on file.     Family History  Problem Relation Age of Onset  . Hypertension Mother     Social History   Tobacco Use  . Smoking status: Current Every Day Smoker    Packs/day: 1.00    Years: 37.00    Pack years: 37.00    Types: Cigarettes  . Smokeless tobacco: Never Used  Substance Use Topics  . Alcohol use: Yes    Comment: 12/22/2017 "maybe twice a year"  . Drug use: Never    Home Medications Prior to Admission medications   Medication Sig Start Date End Date Taking? Authorizing Provider  acetaminophen (TYLENOL) 500 MG tablet Take 500 mg by mouth every 6 (six) hours as needed for moderate pain.    [provider]  amLODipine (NORVASC) 10 MG tablet Take 1 tablet (10 mg total) by mouth daily. 07/15/19   Grayce Sessions, NP  losartan-hydrochlorothiazide (HYZAAR) 100-25 MG tablet Take 1 tablet by mouth daily.  07/15/19 08/14/19  Grayce Sessions, NP  meloxicam (MOBIC) 15 MG tablet Take 1 tablet (15 mg total) by mouth daily. 07/30/19   Cammy Copa, MD  pantoprazole (PROTONIX) 20 MG tablet Take 1 tablet (20 mg total) by mouth daily. 01/30/18 02/26/19  Dimple Nanas, MD    Allergies    Patient has no known allergies.  Review of Systems   Review of Systems  Constitutional: Negative for chills, diaphoresis and fever.  HENT: Negative for hearing loss, sore throat and tinnitus.   Eyes: Positive for visual disturbance. Negative for photophobia  and pain.  Respiratory: Negative for cough and shortness of breath.   Cardiovascular: Positive for near-syncope. Negative for chest pain and palpitations.  Gastrointestinal: Positive for nausea and vomiting. Negative for abdominal pain, constipation and diarrhea.  Genitourinary: Negative for dysuria and frequency.  Musculoskeletal: Negative for myalgias.  Skin: Negative.   Neurological: Positive for dizziness and headaches. Negative for syncope, facial asymmetry, weakness and numbness.  Psychiatric/Behavioral: Negative for confusion.    Physical Exam Updated Vital Signs BP 118/67   Pulse 63   Temp 98 F (36.7 C) (Oral)   Resp 18   Ht 5\' 8"  (1.727 m)   Wt (!) 142.9 kg   LMP 01/12/2015   SpO2 96%   BMI 47.90 kg/m   Physical Exam Vitals and nursing note reviewed.  Constitutional:      General: She is not in acute distress.    Appearance: She is not ill-appearing.  HENT:     Head: Normocephalic and atraumatic.     Mouth/Throat:     Mouth: Mucous membranes are moist.  Eyes:     Extraocular Movements: Extraocular movements intact.     Conjunctiva/sclera: Conjunctivae normal.     Pupils: Pupils are equal, round, and reactive to light.  Cardiovascular:     Rate and Rhythm: Normal rate and regular rhythm.     Heart sounds: Normal heart sounds.  Pulmonary:     Effort: Pulmonary effort is normal. No respiratory distress.     Breath sounds: Normal breath sounds.  Abdominal:     General: Abdomen is flat. Bowel sounds are normal. There is no distension.     Palpations: Abdomen is soft.     Tenderness: There is no abdominal tenderness. There is no guarding or rebound.  Musculoskeletal:     Right lower leg: No edema.     Left lower leg: No edema.     Comments: Known limited ROM in L shoulder due to frozen shoulder.  Skin:    General: Skin is warm and dry.  Neurological:     General: No focal deficit present.     Mental Status: She is alert.     Comments: Mental  Status: Patient is awake, alert, oriented x3. No signs of aphasia or neglect Cranial Nerves: II: Pupils equal, round, and reactive to light.  III,IV, VI: EOMI without ptosis or diploplia.  V: Facial sensation is symmetric tolight touch. VII: Facial movement is symmetric.  VIII: Hearing is intact to voice X: Uvula elevates symmetrically XI: Shoulder shrug is symmetric. XII: Tongue is midline without atrophy or fasciculations.  Motor: Good effort thorughout, at least 5/5 bilateral UE, 5/5 bilateral LE Sensory: Sensation is grossly intact throughout bilateral UEs & LEs Cerebellar: Finger-Nose and Heel-Shin intact bilaterally  Psychiatric:        Mood and Affect: Mood is anxious.    ED Results / Procedures / Treatments   Labs (all labs ordered  are listed, but only abnormal results are displayed) Labs Reviewed  COMPREHENSIVE METABOLIC PANEL - Abnormal; Notable for the following components:      Result Value   Potassium 3.3 (*)    Glucose, Bld 119 (*)    Albumin 3.2 (*)    AST 14 (*)    All other components within normal limits  CBC WITH DIFFERENTIAL/PLATELET - Abnormal; Notable for the following components:   WBC 12.3 (*)    Neutro Abs 8.4 (*)    Monocytes Absolute 1.1 (*)    Abs Immature Granulocytes 0.09 (*)    All other components within normal limits  CBG MONITORING, ED - Abnormal; Notable for the following components:   Glucose-Capillary 110 (*)    All other components within normal limits    EKG EKG Interpretation  Date/Time:  Friday August 06 2019 11:54:39 EST Ventricular Rate:  89 PR Interval:    QRS Duration: 89 QT Interval:  378 QTC Calculation: 458 R Axis:   8 Text Interpretation: Sinus rhythm Low voltage, precordial leads No significant change since last tracing Confirmed by Jacalyn Lefevre [phone]) on 08/06/2019 11:58:27 AM   Radiology CT Head Wo Contrast  Result Date: 08/06/2019 CLINICAL DATA:  Dizziness with nausea and vomiting EXAM: CT HEAD WITHOUT  CONTRAST TECHNIQUE: Contiguous axial images were obtained from the base of the skull through the vertex without intravenous contrast. COMPARISON:  December 10, 2016 FINDINGS: Brain: Ventricles and sulci are normal in size and configuration. There is no intracranial mass, hemorrhage, extra-axial fluid collection, or midline shift. There is patchy small vessel disease in the centra semiovale bilaterally. Elsewhere brain parenchyma appears unremarkable. No acute infarct evident. Vascular: No hyperdense vessels. There is mild left carotid siphon region calcification. Skull: The bony calvarium appears intact. Sinuses/Orbits: There is opacification in multiple ethmoid air cells, primarily anteriorly with obstruction of nares bilaterally, more severe on the left than on the right. There is questionable bony destruction involving several bony septi in the ethmoid regions anteriorly. Orbits appear symmetric bilaterally. Other: Mastoid air cells are clear. IMPRESSION: 1. Question antrochoanal polyposis in the anterior ethmoid regions extending into the nares. Suspect bony destruction of several bony septi in this region. This finding is felt to warrant ENT assessment for further evaluation. 2. Stable patchy periventricular small vessel disease. No acute infarct. No mass or hemorrhage. Electronically Signed   By: Bretta Bang III M.D.   On: 08/06/2019 13:25    Procedures Procedures (including critical care time)  Medications Ordered in ED Medications  meclizine (ANTIVERT) tablet 25 mg (has no administration in time range)  potassium chloride SA (KLOR-CON) CR tablet 40 mEq (has no administration in time range)  LORazepam (ATIVAN) injection 1 mg (1 mg Intravenous Given 08/06/19 1242)  promethazine (PHENERGAN) injection 12.5 mg (12.5 mg Intravenous Given 08/06/19 1244)    ED Course  I have reviewed the triage vital signs and the nursing notes.  Pertinent labs & imaging results that were available during my care of  the patient were reviewed by me and considered in my medical decision making (see chart for details).    MDM Rules/Calculators/A&P                      Sharon Dyer is a 54 y.o. female with significant PMH of hypertension, obesity, tobacco use disorder, and L frozen shoulder, who presents for sudden onset dizziness and nausea/vomiting. Neuro exam reassuring and non focal. CT head without evidence of mass, bleed, or  acute infarct. It did show possible antrochoanal polyposis with bony destruction of severe bony septi in the region, which will need ENT follow-up. Afebrile and hemodynamically stable. CBC with leukocytosis to 12.3 and left shift. BMP significant only for mild hypokalemia to 3.3 and glc elevated to 119.  Pt remains persistently dizzy with vision changes, despite treatment with phenergan, ativan, and meclizine. Her nausea is somewhat improved. Will get MRI to rule out posterior circulation pathology. If negative, suspect pt's symptoms secondary to peripheral vertigo and would treat symptomatically with antiemetics and have pt follow-up with PCP for potential vestibular rehab.  Cased signed out to Dr. Sherry Ruffing in the ED, who will follow-up MRI results.  Final Clinical Impression(s) / ED Diagnoses Final diagnoses:  None    Rx / DC Orders ED Discharge Orders    None       Ladona Horns, MD 08/06/19 3235    Isla Pence, MD 08/12/19 1159

## 2019-08-06 NOTE — ED Provider Notes (Signed)
3:31 PM Care assumed for Dr. Yetta Barre.  At time of transfer care, patient is awaiting results of MRI to look for a central neurologic cause of her dizziness.  The previous team suspected peripheral vertigo and if MRI is negative, anticipate discharge with medication prescriptions and PCP follow-up.  If MRI does show abnormalities for stroke, dissipate touching base with neurology.  MRI did not show stroke, bleed, or mass.  Patient reassessed and she reports her dizziness has resolved.  She would like to go home to follow-up with her PCP.  Patient will be given prescription for the meclizine and Phenergan which helped her today.  Patient agreed with plan of care and was discharged in good condition with improved symptoms.  Suspect vertigo.   Clinical Impression: 1. Dizziness     Disposition: Discharge  Condition: Good  I have discussed the results, Dx and Tx plan with the pt(& family if present). He/she/they expressed understanding and agree(s) with the plan. Discharge instructions discussed at great length. Strict return precautions discussed and pt &/or family have verbalized understanding of the instructions. No further questions at time of discharge.    Discharge Medication List as of 08/06/2019  8:01 PM     START taking these medications   Details  meclizine (ANTIVERT) 25 MG tablet Take 1 tablet (25 mg total) by mouth 3 (three) times daily as needed for dizziness., Starting Fri 08/06/2019, Print    promethazine (PHENERGAN) 25 MG tablet Take 1 tablet (25 mg total) by mouth every 6 (six) hours as needed for nausea or vomiting., Starting Fri 08/06/2019, Print        Follow Up: Grayce Sessions, NP [address] [place] [address] [phone]     [facility] EMERGENCY DEPARTMENT [address] [id number] [place]c [place] [address] [phone]       Carisa Backhaus, Canary Brim, MD 08/07/19 [phone]

## 2019-08-06 NOTE — Discharge Instructions (Signed)
Your MRI today did not show evidence of acute stroke, tumor, or bleed.  We suspect your dizziness was due to vertigo.  As your dizziness has improved after medications, please use the prescription of the meclizine for dizziness and the Phenergan for the nausea to help with your symptoms.  Please rest and stay hydrated.  If any symptoms change or worsen, please return to the nearest emergency department.  Please follow-up with your primary doctor for further management.

## 2019-08-16 MED FILL — MECLIZINE 25 MG TABLET: 25 | 10 days supply | Qty: 30 | Fill #0

## 2019-08-16 MED FILL — PROMETHAZINE 25 MG TABLET: 25 | 5 days supply | Qty: 20 | Fill #0

## 2019-08-16 MED FILL — AMLODIPINE BESYLATE 10 MG T: 10 | 30 days supply | Qty: 30 | Fill #2

## 2019-08-16 MED FILL — LOSARTAN-HCTZ 100-25 MG TAB: 100-25 | 30 days supply | Qty: 30 | Fill #2

## 2019-09-20 MED FILL — LOSARTAN-HCTZ 100-25 MG TAB: 100-25 | 30 days supply | Qty: 30 | Fill #3

## 2019-09-20 MED FILL — AMLODIPINE BESYLATE 10 MG T: 10 | 30 days supply | Qty: 30 | Fill #3

## 2019-09-27 ENCOUNTER — Ambulatory Visit (INDEPENDENT_AMBULATORY_CARE_PROVIDER_SITE_OTHER): Payer: Medicaid Other | Admitting: Primary Care

## 2019-10-01 ENCOUNTER — Ambulatory Visit (INDEPENDENT_AMBULATORY_CARE_PROVIDER_SITE_OTHER): Payer: Medicaid Other | Admitting: Primary Care

## 2019-10-04 ENCOUNTER — Other Ambulatory Visit: Payer: Self-pay

## 2019-10-04 ENCOUNTER — Ambulatory Visit (INDEPENDENT_AMBULATORY_CARE_PROVIDER_SITE_OTHER): Payer: Self-pay | Admitting: Primary Care

## 2019-10-04 ENCOUNTER — Encounter (INDEPENDENT_AMBULATORY_CARE_PROVIDER_SITE_OTHER): Payer: Self-pay | Admitting: Primary Care

## 2019-10-04 ENCOUNTER — Other Ambulatory Visit (HOSPITAL_COMMUNITY)
Admission: RE | Admit: 2019-10-04 | Discharge: 2019-10-04 | Disposition: A | Discharge status: Home / Self Care | Payer: Medicaid Other | Source: Ambulatory Visit | Attending: Primary Care | Admitting: Primary Care

## 2019-10-04 VITALS — BP 116/82 | HR 72 | Temp 97.2°F | Ht 68.0 in | Wt 303.2 lb

## 2019-10-04 DIAGNOSIS — I10 Essential (primary) hypertension: Secondary | ICD-10-CM

## 2019-10-04 DIAGNOSIS — Z124 Encounter for screening for malignant neoplasm of cervix: Secondary | ICD-10-CM | POA: Diagnosis present

## 2019-10-04 DIAGNOSIS — E876 Hypokalemia: Secondary | ICD-10-CM

## 2019-10-04 DIAGNOSIS — Z1231 Encounter for screening mammogram for malignant neoplasm of breast: Secondary | ICD-10-CM

## 2019-10-04 MED ORDER — AMLODIPINE BESYLATE 10 MG PO TABS: 10.0000 mg | ORAL_TABLET | Freq: Every day | ORAL | 1 refills | Status: DC

## 2019-10-04 MED ORDER — LOSARTAN POTASSIUM-HCTZ 100-25 MG PO TABS: 1.0000 | ORAL_TABLET | Freq: Every day | ORAL | 1 refills | Status: DC

## 2019-10-04 NOTE — Patient Instructions (Signed)

## 2019-10-04 NOTE — Progress Notes (Signed)
Subjective:  Patient ID: Charisse Klinefelter, female    DOB: March 25, 1966  Age: 54 y.o. MRN: [id number]  CC: Gynecologic Exam   HPI SIRIA CALANDRO presents for a female wellness exam. She voices no other complaints. Actually she praising herself with taking her medications and watching what she eating with improvement of Blood pressure.  Outpatient Medications Prior to Visit  Medication Sig Dispense Refill  . acetaminophen (TYLENOL) 500 MG tablet Take 500 mg by mouth every 6 (six) hours as needed for moderate pain.    . meclizine (ANTIVERT) 25 MG tablet Take 1 tablet (25 mg total) by mouth 3 (three) times daily as needed for dizziness. 30 tablet 0  . promethazine (PHENERGAN) 25 MG tablet Take 1 tablet (25 mg total) by mouth every 6 (six) hours as needed for nausea or vomiting. 20 tablet 0  . amLODipine (NORVASC) 10 MG tablet Take 1 tablet (10 mg total) by mouth daily. 90 tablet 1  . meloxicam (MOBIC) 15 MG tablet Take 1 tablet (15 mg total) by mouth daily. 30 tablet 0  . pantoprazole (PROTONIX) 20 MG tablet Take 1 tablet (20 mg total) by mouth daily. 30 tablet 0  . losartan-hydrochlorothiazide (HYZAAR) 100-25 MG tablet Take 1 tablet by mouth daily. 90 tablet 1   No facility-administered medications prior to visit.    ROS Review of Systems  Gastrointestinal: Positive for constipation.       Takes OTC laxative  All other systems reviewed and are negative.   Objective:  BP 116/82 (BP Location: Right Arm, Patient Position: Sitting, Cuff Size: Large)   Pulse 72   Temp (!) 97.2 F (36.2 C) (Temporal)   Ht _0  (1.727 m)   Wt (!) 303 lb 3.2 oz (137.5 kg)   LMP 01/12/2015   SpO2 96%   BMI 46.10 kg/m   BP/Weight 10/04/2019 08/06/2019 30/06/6008  Systolic BP 932 355 732  Diastolic BP 82 89 66  Wt. (Lbs) 303.2 315 -  BMI 46.1 47.9 -      Physical Exam CONSTITUTIONAL: Well-developed, well-nourished female in no acute distress.  HENT:  Normocephalic, atraumatic, External right and  left ear normal.  EYES: Conjunctivae and EOM are normal. Pupils are equal, round, and reactive to light. No scleral icterus.  NECK: Normal range of motion, supple, no masses.  Normal thyroid.  SKIN: Skin is warm and dry. No rash noted. Not diaphoretic. No erythema. No pallor. [place]: Alert and oriented to person, place, and time. Normal reflexes, muscle tone coordination. No cranial nerve deficit noted. PSYCHIATRIC: Normal mood and affect. Normal behavior. Normal judgment and thought content. CARDIOVASCULAR: Normal heart rate noted, regular rhythm RESPIRATORY: Clear to auscultation bilaterally. Effort and breath sounds normal, no problems with respiration noted. BREASTS: left breast 1 o'clock mass present movable location  ABDOMEN: Soft, normal bowel sounds, no distention noted.  No tenderness, rebound or guarding.  PELVIC: Normal appearing external genitalia; normal appearing vaginal mucosa and cervix.  No abnormal discharge noted.  Pap smear obtained.  Normal uterine size, no other palpable masses, no uterine or adnexal tenderness. MUSCULOSKELETAL: Normal range of motion. No tenderness.  No cyanosis, clubbing, or edema.  2+ distal pulses.  Assessment & Plan:  Nelli was seen today for gynecologic exam.  Diagnoses and all orders for this visit:  Essential hypertension We have discussed target BP range and blood pressure goal. I have advised patient to check BP regularly and to call us back or report to clinic if the numbers are  consistently higher than 140/90. We discussed the importance of compliance with medical therapy and DASH diet recommended, consequences of uncontrolled hypertension discussed.  - continue current BP medications -     losartan-hydrochlorothiazide (HYZAAR) 100-25 MG tablet; Take 1 tablet by mouth daily.  Screening for cervical cancer The USPSTF recommendations screening of cervical cancer every 3 years with cervical cytology.-     Cytology - PAP([facility])  Encounter for screening mammogram for malignant neoplasm of breast Patient completed application for BCCP while in clinic and application has and faxed to [facility]. Patient aware that [facility] will contact her directly to schedule appointment.  Hypokalemia -     CMP14+EGFR  Other orders -     amLODipine (NORVASC) 10 MG tablet; Take 1 tablet (10 mg total) by mouth daily.   There are no diagnoses linked to this encounter.  Meds ordered this encounter  Medications  . losartan-hydrochlorothiazide (HYZAAR) 100-25 MG tablet    Sig: Take 1 tablet by mouth daily.    Dispense:  90 tablet    Refill:  1  . amLODipine (NORVASC) 10 MG tablet    Sig: Take 1 tablet (10 mg total) by mouth daily.    Dispense:  90 tablet    Refill:  1    Follow-up: Return in about 6 months (around 04/04/2020) for HTN in person .   Kerin Perna NP

## 2019-10-05 LAB — CYTOLOGY - PAP
Adequacy: ABSENT
Diagnosis: NEGATIVE

## 2019-10-05 LAB — CMP14+EGFR
ALT: 11 IU/L (ref 0–32)
AST: 12 IU/L (ref 0–40)
Albumin/Globulin Ratio: 1.4 (ref 1.2–2.2)
Albumin: 4.2 g/dL (ref 3.8–4.9)
Alkaline Phosphatase: 76 IU/L (ref 39–117)
BUN/Creatinine Ratio: 21 (ref 9–23)
BUN: 15 mg/dL (ref 6–24)
Bilirubin Total: 0.2 mg/dL (ref 0.0–1.2)
CO2: 24 mmol/L (ref 20–29)
Calcium: 9.5 mg/dL (ref 8.7–10.2)
Chloride: 101 mmol/L (ref 96–106)
Creatinine, Ser: 0.72 mg/dL (ref 0.57–1.00)
GFR calc Af Amer: 109 mL/min/{1.73_m2} (ref >59)
GFR calc non Af Amer: 95 mL/min/{1.73_m2} (ref >59)
Globulin, Total: 3 g/dL (ref 1.5–4.5)
Glucose: 95 mg/dL (ref 65–99)
Potassium: 3.5 mmol/L (ref 3.5–5.2)
Sodium: 139 mmol/L (ref 134–144)
Total Protein: 7.2 g/dL (ref 6.0–8.5)

## 2019-10-07 ENCOUNTER — Telehealth (INDEPENDENT_AMBULATORY_CARE_PROVIDER_SITE_OTHER): Payer: Self-pay

## 2019-10-07 NOTE — Telephone Encounter (Signed)
Patient verified date of birth. She is aware that pap is normal, repeat in three years. Potassium normal but still a little low. Advised patient of some foods that contain potassium that she may consume. She verbalized understanding. Maryjean Morn, CMA

## 2019-10-07 NOTE — Telephone Encounter (Signed)
-----   Message from Grayce Sessions, NP sent at 10/05/2019  1:51 PM EDT ----- Pap results are normal. Next pap due in 3 years

## 2020-07-21 ENCOUNTER — Other Ambulatory Visit (INDEPENDENT_AMBULATORY_CARE_PROVIDER_SITE_OTHER): Payer: Self-pay | Admitting: Primary Care

## 2020-07-21 DIAGNOSIS — I10 Essential (primary) hypertension: Secondary | ICD-10-CM

## 2020-07-21 MED ORDER — AMLODIPINE BESYLATE 10 MG PO TABS: 10.0000 mg | ORAL_TABLET | Freq: Every day | ORAL | 0 refills | Status: DC

## 2020-07-21 MED ORDER — LOSARTAN POTASSIUM-HCTZ 100-25 MG PO TABS: 1.0000 | ORAL_TABLET | Freq: Every day | ORAL | 0 refills | Status: DC

## 2020-07-21 NOTE — Telephone Encounter (Signed)
Copied from CRM [id number]. Topic: Quick Communication - Rx Refill/Question >> Jul 21, 2020  9:27 AM Jaquita Rector A wrote: Medication: amLODipine (NORVASC) 10 MG tablet, losartan-hydrochlorothiazide (HYZAAR) 100-25 MG tablet   Has the patient contacted their pharmacy? Yes.   (Agent: If no, request that the patient contact the pharmacy for the refill.) (Agent: If yes, when and what did the pharmacy advise?)  Preferred Pharmacy (with phone number or street name): [facility] [address] [place] - [address]  Phone:  [phone] Fax:  [phone]     Agent: Please be advised that RX refills may take up to 3 business days. We ask that you follow-up with your pharmacy.

## 2020-07-22 ENCOUNTER — Other Ambulatory Visit (INDEPENDENT_AMBULATORY_CARE_PROVIDER_SITE_OTHER): Payer: Self-pay | Admitting: Primary Care

## 2020-07-27 ENCOUNTER — Other Ambulatory Visit (INDEPENDENT_AMBULATORY_CARE_PROVIDER_SITE_OTHER): Payer: Self-pay | Admitting: Primary Care

## 2020-07-27 MED ORDER — AMLODIPINE BESYLATE 10 MG PO TABS: 10.0000 mg | ORAL_TABLET | Freq: Every day | ORAL | 0 refills | Status: DC

## 2020-07-27 NOTE — Telephone Encounter (Signed)
PT was requesting emergency supply to the pharmacy listed below.

## 2020-07-27 NOTE — Telephone Encounter (Signed)
Future visit scheduled for 08/09/20.

## 2020-07-27 NOTE — Telephone Encounter (Signed)
Requested medication (s) are due for refill today: yes  Requested medication (s) are on the active medication list: yes  Last refill:  07/21/20 #30 0 refills  Future visit scheduled: no   Notes to clinic:  no valid encounter within 6 months, called patient to schedule appt. No answer, unable to leave message, voicemail box is full.. Do you want a courtesy refill?     Requested Prescriptions  Pending Prescriptions Disp Refills   amLODipine (NORVASC) 10 MG tablet [Pharmacy Med Name: amLODIPine BESYLATE 10 MG TAB] 30 tablet 0    Sig: TAKE ONE TABLET BY MOUTH DAILY      Cardiovascular:  Calcium Channel Blockers Failed - 07/27/2020 11:06 AM      Failed - Valid encounter within last 6 months    Recent Outpatient Visits           9 months ago Essential hypertension   [facility] Grayce Sessions, NP   1 year ago Essential hypertension   [facility] Gray[facility] Sessions, NP   1 year ago Essential hypertension   [facility] Grayce Sessions, NP   1 year ago Essential hypertension   [facility] Grayce Sessions, NP   1 year ago [name]   [facility] Grayce Sessions, NP                Passed - Last BP in normal range    BP Readings from Last 1 Encounters:  10/04/19 116/82

## 2020-07-27 NOTE — Telephone Encounter (Signed)
Medication Refill - Medication:  amLODipine (NORVASC) 10 MG tablet   Has the patient contacted their pharmacy? Yes.  Contact doctors office. PT is completely out.   Preferred Pharmacy (with phone number or street name):   [facility] [address], [place] - [address]  [address] [place] [address]  Phone: [phone] Fax: [phone]    Agent: Please be advised that RX refills may take up to 3 business days. We ask that you follow-up with your pharmacy.

## 2020-08-09 ENCOUNTER — Encounter (INDEPENDENT_AMBULATORY_CARE_PROVIDER_SITE_OTHER): Payer: Self-pay | Admitting: Primary Care

## 2020-08-09 ENCOUNTER — Ambulatory Visit (INDEPENDENT_AMBULATORY_CARE_PROVIDER_SITE_OTHER): Payer: Self-pay | Admitting: Primary Care

## 2020-08-09 ENCOUNTER — Other Ambulatory Visit: Payer: Self-pay

## 2020-08-09 VITALS — BP 124/77 | HR 65 | Temp 97.3°F | Ht 68.0 in | Wt 291.6 lb

## 2020-08-09 DIAGNOSIS — R202 Paresthesia of skin: Secondary | ICD-10-CM

## 2020-08-09 DIAGNOSIS — Z1211 Encounter for screening for malignant neoplasm of colon: Secondary | ICD-10-CM

## 2020-08-09 DIAGNOSIS — E876 Hypokalemia: Secondary | ICD-10-CM

## 2020-08-09 DIAGNOSIS — Z760 Encounter for issue of repeat prescription: Secondary | ICD-10-CM

## 2020-08-09 DIAGNOSIS — Z720 Tobacco use: Secondary | ICD-10-CM

## 2020-08-09 DIAGNOSIS — I10 Essential (primary) hypertension: Secondary | ICD-10-CM

## 2020-08-09 MED ORDER — LOSARTAN POTASSIUM-HCTZ 100-25 MG PO TABS: 1.0000 | ORAL_TABLET | Freq: Every day | ORAL | 1 refills | Status: DC

## 2020-08-09 MED ORDER — AMLODIPINE BESYLATE 10 MG PO TABS: 10.0000 mg | ORAL_TABLET | Freq: Every day | ORAL | 1 refills | Status: DC

## 2020-08-09 MED ORDER — LOSARTAN POTASSIUM-HCTZ 100-25 MG PO TABS: 1.0000 | ORAL_TABLET | Freq: Every day | ORAL | 0 refills | Status: DC

## 2020-08-09 NOTE — Progress Notes (Deleted)
Established Patient Office Visit  Subjective:  Patient ID: Sharon Dyer, female    DOB: Aug 03, 1965  Age: 55 y.o. MRN: [id number]  CC:  Chief Complaint  Patient presents with  . Hypertension  . Medication Refill    HPI Sharon Dyer presents for ***  Past Medical History:  Diagnosis Date  . Anxiety   . Arthritis    Patient verbalizes she has athritis to right ankle  . Dyspnea   . Frozen shoulder syndrome 07/30/2019  . Hypertension   . Obesity     Past Surgical History:  Procedure Laterality Date  . ECTOPIC PREGNANCY SURGERY  X 2    Family History  Problem Relation Age of Onset  . Hypertension Mother     Social History   Socioeconomic History  . Marital status: Divorced    Spouse name: Not on file  . Number of children: Not on file  . Years of education: Not on file  . Highest education level: Not on file  Occupational History  . Not on file  Tobacco Use  . Smoking status: Current Every Day Smoker    Packs/day: 1.00    Years: 37.00    Pack years: 37.00    Types: Cigarettes  . Smokeless tobacco: Never Used  Vaping Use  . Vaping Use: Never used  Substance and Sexual Activity  . Alcohol use: Yes    Comment: 12/22/2017 "maybe twice a year"  . Drug use: Never  . Sexual activity: Yes    Birth control/protection: None  Other Topics Concern  . Not on file  Social History Narrative  . Not on file   Social Determinants of Health   Financial Resource Strain: Not on file  Food Insecurity: Not on file  Transportation Needs: Not on file  Physical Activity: Not on file  Stress: Not on file  Social Connections: Not on file  Intimate Partner Violence: Not on file    Outpatient Medications Prior to Visit  Medication Sig Dispense Refill  . meclizine (ANTIVERT) 25 MG tablet Take 1 tablet (25 mg total) by mouth 3 (three) times daily as needed for dizziness. 30 tablet 0  . amLODipine (NORVASC) 10 MG tablet Take 1 tablet (10 mg total) by mouth daily. 13  tablet 0  . losartan-hydrochlorothiazide (HYZAAR) 100-25 MG tablet Take 1 tablet by mouth daily. 30 tablet 0  . acetaminophen (TYLENOL) 500 MG tablet Take 500 mg by mouth every 6 (six) hours as needed for moderate pain.    Marland Kitchen amLODipine (NORVASC) 10 MG tablet TAKE ONE TABLET BY MOUTH DAILY 30 tablet 0  . meloxicam (MOBIC) 15 MG tablet Take 1 tablet (15 mg total) by mouth daily. 30 tablet 0  . pantoprazole (PROTONIX) 20 MG tablet Take 1 tablet (20 mg total) by mouth daily. 30 tablet 0  . promethazine (PHENERGAN) 25 MG tablet Take 1 tablet (25 mg total) by mouth every 6 (six) hours as needed for nausea or vomiting. 20 tablet 0   No facility-administered medications prior to visit.    No Known Allergies  ROS Review of Systems    Objective:    Physical Exam  BP 124/77 (BP Location: Right Arm, Patient Position: Sitting, Cuff Size: Large)   Pulse 65   Temp (!) 97.3 F (36.3 C) (Temporal)   Ht _0  (1.727 m)   Wt 291 lb 9.6 oz (132.3 kg)   LMP 01/12/2015   SpO2 93%   BMI 44.34 kg/m  Wt Readings from Last  3 Encounters:  08/09/20 291 lb 9.6 oz (132.3 kg)  10/04/19 (!) 303 lb 3.2 oz (137.5 kg)  08/06/19 (!) 315 lb (142.9 kg)     Health Maintenance Due  Topic Date Due  . COVID-19 Vaccine (1) Never done  . COLONOSCOPY (Pts 45-63yr Insurance coverage will need to be confirmed)  Never done  . MAMMOGRAM  Never done    There are no preventive care reminders to display for this patient.  Lab Results  Component Value Date   TSH 1.240 02/05/2019   Lab Results  Component Value Date   WBC 12.3 (H) 08/06/2019   HGB 13.4 08/06/2019   HCT 41.5 08/06/2019   MCV 93.3 08/06/2019   PLT 228 08/06/2019   Lab Results  Component Value Date   NA 139 10/04/2019   K 3.5 10/04/2019   CO2 24 10/04/2019   GLUCOSE 95 10/04/2019   BUN 15 10/04/2019   CREATININE 0.72 10/04/2019   BILITOT 0.2 10/04/2019   ALKPHOS 76 10/04/2019   AST 12 10/04/2019   ALT 11 10/04/2019   PROT 7.2  10/04/2019   ALBUMIN 4.2 10/04/2019   CALCIUM 9.5 10/04/2019   ANIONGAP 11 08/06/2019   Lab Results  Component Value Date   CHOL 186 02/05/2019   Lab Results  Component Value Date   HDL 44 02/05/2019   Lab Results  Component Value Date   LDLCALC 123 (H) 02/05/2019   Lab Results  Component Value Date   TRIG 95 02/05/2019   Lab Results  Component Value Date   CHOLHDL 4.2 02/05/2019   No results found for: HGBA1C    Assessment & Plan:   Problem List Items Addressed This Visit    Hypokalemia - Primary   Relevant Orders   CMP14+EGFR   Tobacco abuse    Other Visit Diagnoses    Essential hypertension       Relevant Medications   amLODipine (NORVASC) 10 MG tablet   losartan-hydrochlorothiazide (HYZAAR) 100-25 MG tablet   Other Relevant Orders   CBC with Differential   Morbid obesity (HCC)       Relevant Orders   Lipid Panel   Medication refill       Relevant Medications   amLODipine (NORVASC) 10 MG tablet   losartan-hydrochlorothiazide (HYZAAR) 100-25 MG tablet   Paresthesias          Meds ordered this encounter  Medications  . amLODipine (NORVASC) 10 MG tablet    Sig: Take 1 tablet (10 mg total) by mouth daily.    Dispense:  90 tablet    Refill:  1    Appointment needed for further refills  . losartan-hydrochlorothiazide (HYZAAR) 100-25 MG tablet    Sig: Take 1 tablet by mouth daily.    Dispense:  30 tablet    Refill:  0    Follow-up: No follow-ups on file.    MKerin Perna NP

## 2020-08-09 NOTE — Progress Notes (Signed)
Patient needs printed Rx because she uses good Rx

## 2020-08-09 NOTE — Patient Instructions (Signed)
Paresthesia Paresthesia is a burning or prickling feeling. This feeling can happen in any part of the body. It often happens in the hands, arms, legs, or feet. Usually, it is not painful. In most cases, the feeling goes away in a short time and is not a sign of a serious problem. If you have paresthesia that lasts a long time, you need to be seen by your doctor. Follow these instructions at home: Alcohol use  Do not drink alcohol if: ? Your doctor tells you not to drink. ? You are pregnant, may be pregnant, or are planning to become pregnant.  If you drink alcohol: ? Limit how much you use to:  0-1 drink a day for women.  0-2 drinks a day for men. ? Be aware of how much alcohol is in your drink. In the [place] one drink equals one 12 oz bottle of beer (355 mL), one 5 oz glass of wine (148 mL), or one 1 oz glass of hard liquor (44 mL).   Nutrition  Eat a healthy diet. This includes: ? Eating foods that have a lot of fiber in them, such as fresh fruits and vegetables, whole grains, and beans. ? Limiting foods that have a lot of fat and processed sugars in them, such as fried or sweet foods.   General instructions  Take over-the-counter and prescription medicines only as told by your doctor.  Do not use any products that have nicotine or tobacco in them, such as cigarettes and e-cigarettes. If you need help quitting, ask your doctor.  If you have diabetes, work with your doctor to make sure your blood sugar stays in a healthy range.  If your feet feel numb: ? Check for redness, warmth, and swelling every day. ? Wear padded socks and comfortable shoes. These help protect your feet.  Keep all follow-up visits as told by your doctor. This is important. Contact a doctor if:  You have paresthesia that gets worse or does not go away.  Lose feeling (have numbness) after an injury.  Your burning or prickling feeling gets worse when you walk.  You have pain or cramps.  You feel dizzy  or pass out (faint).  You have a rash. Get help right away if you:  Feel weak or have new weakness in an arm or leg.  Have trouble walking or moving.  Have problems speaking, understanding, or seeing.  Feel confused.  Cannot control when you pee (urinate) or poop (have a bowel movement). Summary  Paresthesia is a burning or prickling feeling. It often happens in the hands, arms, legs, or feet.  In most cases, the feeling goes away in a short time and is not a sign of a serious problem.  If you have paresthesia that lasts a long time, you need to be seen by your doctor. This information is not intended to replace advice given to you by your health care provider. Make sure you discuss any questions you have with your health care provider. Document Revised: 03/07/2020 Document Reviewed: 03/07/2020 Elsevier Patient Education  2021 Elsevier Inc.  

## 2020-08-10 LAB — CBC WITH DIFFERENTIAL/PLATELET
Basophils Absolute: 0.1 10*3/uL (ref 0.0–0.2)
Basos: 1 %
EOS (ABSOLUTE): 0.3 10*3/uL (ref 0.0–0.4)
Eos: 3 %
Hematocrit: 39 % (ref 34.0–46.6)
Hemoglobin: 13.4 g/dL (ref 11.1–15.9)
Immature Grans (Abs): 0 10*3/uL (ref 0.0–0.1)
Immature Granulocytes: 0 %
Lymphocytes Absolute: 2.2 10*3/uL (ref 0.7–3.1)
Lymphs: 22 %
MCH: 30.2 pg (ref 26.6–33.0)
MCHC: 34.4 g/dL (ref 31.5–35.7)
MCV: 88 fL (ref 79–97)
Monocytes Absolute: 0.8 10*3/uL (ref 0.1–0.9)
Monocytes: 8 %
Neutrophils Absolute: 6.5 10*3/uL (ref 1.4–7.0)
Neutrophils: 66 %
Platelets: 216 10*3/uL (ref 150–450)
RBC: 4.43 x10E6/uL (ref 3.77–5.28)
RDW: 13.2 % (ref 11.7–15.4)
WBC: 9.8 10*3/uL (ref 3.4–10.8)

## 2020-08-10 LAB — LIPID PANEL
Chol/HDL Ratio: 4.5 ratio — ABNORMAL HIGH (ref 0.0–4.4)
Cholesterol, Total: 168 mg/dL (ref 100–199)
HDL: 37 mg/dL — ABNORMAL LOW (ref >39)
LDL Chol Calc (NIH): 115 mg/dL — ABNORMAL HIGH (ref 0–99)
Triglycerides: 87 mg/dL (ref 0–149)
VLDL Cholesterol Cal: 16 mg/dL (ref 5–40)

## 2020-08-10 LAB — CMP14+EGFR
ALT: 9 IU/L (ref 0–32)
AST: 12 IU/L (ref 0–40)
Albumin/Globulin Ratio: 1.3 (ref 1.2–2.2)
Albumin: 3.8 g/dL (ref 3.8–4.9)
Alkaline Phosphatase: 88 IU/L (ref 44–121)
BUN/Creatinine Ratio: 18 (ref 9–23)
BUN: 12 mg/dL (ref 6–24)
Bilirubin Total: 0.3 mg/dL (ref 0.0–1.2)
CO2: 24 mmol/L (ref 20–29)
Calcium: 9.1 mg/dL (ref 8.7–10.2)
Chloride: 105 mmol/L (ref 96–106)
Creatinine, Ser: 0.68 mg/dL (ref 0.57–1.00)
Globulin, Total: 3 g/dL (ref 1.5–4.5)
Glucose: 97 mg/dL (ref 65–99)
Potassium: 4.1 mmol/L (ref 3.5–5.2)
Sodium: 143 mmol/L (ref 134–144)
Total Protein: 6.8 g/dL (ref 6.0–8.5)
eGFR: 102 mL/min/{1.73_m2} (ref >59)

## 2020-08-11 ENCOUNTER — Telehealth (INDEPENDENT_AMBULATORY_CARE_PROVIDER_SITE_OTHER): Payer: Self-pay | Admitting: *Deleted

## 2020-08-11 ENCOUNTER — Telehealth (INDEPENDENT_AMBULATORY_CARE_PROVIDER_SITE_OTHER): Payer: Self-pay

## 2020-08-11 NOTE — Telephone Encounter (Signed)
Patient is aware of normal lab results. Karah Caruthers S Adithya Difrancesco, CMA  ?

## 2020-08-11 NOTE — Telephone Encounter (Signed)
-----   Message from Grayce Sessions, NP sent at 08/11/2020  8:39 AM EST ----- Labs normal.

## 2020-08-11 NOTE — Telephone Encounter (Signed)
Patient states she is returning call from office for lab results- no message noted- but since all normal message- that was relayed to patient. Labs normal.

## 2021-01-24 ENCOUNTER — Ambulatory Visit (INDEPENDENT_AMBULATORY_CARE_PROVIDER_SITE_OTHER): Payer: Self-pay | Admitting: *Deleted

## 2021-01-24 NOTE — Telephone Encounter (Signed)
I returned pt's call.   She had called in because she has tested positive for Covid today.   Her symptoms started 4-5 days ago and she stated,   "I'm feeling much better today but didn't know if there was anything special I need to be doing since I'm positive on a home test".   I answered her questions and went over the care advice with her.    She thanked me very much for calling her back.

## 2021-01-24 NOTE — Telephone Encounter (Signed)
Reason for Disposition  [1] COVID-19 diagnosed by positive lab test (e.g., PCR, rapid self-test kit) AND [2] mild symptoms (e.g., cough, fever, others) AND [2] no complications or SOB  Answer Assessment - Initial Assessment Questions 1. COVID-19 DIAGNOSIS: "Who made your COVID-19 diagnosis?" "Was it confirmed by a positive lab test or self-test?" If not diagnosed by a doctor (or NP/PA), ask "Are there lots of cases (community spread) where you live?" Note: See public health department website, if unsure.     Pt tested positive for Covid with home test. 2. COVID-19 EXPOSURE: "Was there any known exposure to COVID before the symptoms began?" CDC Definition of close contact: within 6 feet (2 meters) for a total of 15 minutes or more over a 24-hour period.      Not asked 3. ONSET: "When did the COVID-19 symptoms start?"      4-5 days ago   hypertension only high risk  4. WORST SYMPTOM: "What is your worst symptom?" (e.g., cough, fever, shortness of breath, muscle aches)      5. COUGH: "Do you have a cough?" If Yes, ask: "How bad is the cough?"       Coughing a lot, chills yesterday 6. FEVER: "Do you have a fever?" If Yes, ask: "What is your temperature, how was it measured, and when did it start?"     Chills   Vomited once yesterday.  Runny nose a little 7. RESPIRATORY STATUS: "Describe your breathing?" (e.g., shortness of breath, wheezing, unable to speak)      Fine  8. BETTER-SAME-WORSE: "Are you getting better, staying the same or getting worse compared to yesterday?"  If getting worse, ask, "In what way?"     Better 9. HIGH RISK DISEASE: "Do you have any chronic medical problems?" (e.g., asthma, heart or lung disease, weak immune system, obesity, etc.)     Just hypertension 10. VACCINE: "Have you had the COVID-19 vaccine?" If Yes, ask: "Which one, how many shots, when did you get it?"       Not asked 11. BOOSTER: "Have you received your COVID-19 booster?" If Yes, ask: "Which one and when did  you get it?"       Not asked 12. PREGNANCY: "Is there any chance you are pregnant?" "When was your last menstrual period?"       N/A due to age 55. OTHER SYMPTOMS: "Do you have any other symptoms?"  (e.g., chills, fatigue, headache, loss of smell or taste, muscle pain, sore throat)       Vomited once yesterday fine today.   Chills yesterday none today.   No sore throat or runny nose today.   Had a runny nose yesterday.   "I'm feeling better today". 14. O2 SATURATION MONITOR:  "Do you use an oxygen saturation monitor (pulse oximeter) at home?" If Yes, ask "What is your reading (oxygen level) today?" "What is your usual oxygen saturation reading?" (e.g., 95%)       No  Protocols used: Coronavirus (COVID-19) Diagnosed or Suspected-A-AH

## 2021-02-09 ENCOUNTER — Ambulatory Visit (INDEPENDENT_AMBULATORY_CARE_PROVIDER_SITE_OTHER): Payer: Medicaid Other | Admitting: Primary Care

## 2021-04-07 ENCOUNTER — Other Ambulatory Visit (INDEPENDENT_AMBULATORY_CARE_PROVIDER_SITE_OTHER): Payer: Self-pay | Admitting: Primary Care

## 2021-04-07 DIAGNOSIS — I10 Essential (primary) hypertension: Secondary | ICD-10-CM

## 2021-04-07 DIAGNOSIS — Z760 Encounter for issue of repeat prescription: Secondary | ICD-10-CM

## 2021-04-07 NOTE — Telephone Encounter (Signed)
Requested medication (s) are due for refill today: -  Requested medication (s) are on the active medication list: expired 09/08/20  Last refill:  08/09/20  Future visit scheduled: no  Notes to clinic:  med expired 09/08/20   Requested Prescriptions  Pending Prescriptions Disp Refills   losartan-hydrochlorothiazide (HYZAAR) 100-25 MG tablet [Pharmacy Med Name: LOSARTAN-HCTZ 100-25 MG TAB] 30 tablet     Sig: TAKE ONE TABLET BY MOUTH DAILY     Cardiovascular: ARB + Diuretic Combos Failed - 04/07/2021  6:40 AM      Failed - K in normal range and within 180 days    Potassium  Date Value Ref Range Status  08/09/2020 4.1 3.5 - 5.2 mmol/L Final          Failed - Na in normal range and within 180 days    Sodium  Date Value Ref Range Status  08/09/2020 143 134 - 144 mmol/L Final          Failed - Cr in normal range and within 180 days    Creatinine, Ser  Date Value Ref Range Status  08/09/2020 0.68 0.57 - 1.00 mg/dL Final          Failed - Ca in normal range and within 180 days    Calcium  Date Value Ref Range Status  08/09/2020 9.1 8.7 - 10.2 mg/dL Final          Failed - Valid encounter within last 6 months    Recent Outpatient Visits           8 months ago Hypokalemia   [facility] Grayce Sessions, NP   1 year ago Essential hypertension   [facility] Grayce Sessions, NP   1 year ago Essential hypertension   [facility] Grayce Sessions, NP   1 year ago Essential hypertension   [facility] Grayce Sessions, NP   2 years ago Essential hypertension   [facility] Grayce Sessions, NP              Passed - Patient is not pregnant      Passed - Last BP in normal range    BP Readings from Last 1 Encounters:  08/09/20 124/77

## 2021-04-09 NOTE — Telephone Encounter (Signed)
Routed to PCP to refill if appropriate.  ?

## 2021-06-21 ENCOUNTER — Telehealth (INDEPENDENT_AMBULATORY_CARE_PROVIDER_SITE_OTHER): Payer: Self-pay | Admitting: Primary Care

## 2021-06-21 DIAGNOSIS — Z760 Encounter for issue of repeat prescription: Secondary | ICD-10-CM

## 2021-06-21 DIAGNOSIS — I10 Essential (primary) hypertension: Secondary | ICD-10-CM

## 2021-06-21 NOTE — Telephone Encounter (Signed)
Requested medications are due for refill today.  yes  Requested medications are on the active medications list.  yes  Last refill. Amlodipine 08/09/2020 #90/1 refill, Hyzaar 04/09/2021 #30 1 refill.  Future visit scheduled.   no  Notes to clinic.  Pt is more than 3 months overdue for office visit. Last seen in office 08/2020. Please advise.    Requested Prescriptions  Pending Prescriptions Disp Refills   amLODipine (NORVASC) 10 MG tablet [Pharmacy Med Name: amLODIPine BESYLATE 10 MG TAB] 30 tablet     Sig: TAKE ONE TABLET BY MOUTH DAILY     Cardiovascular:  Calcium Channel Blockers Failed - 06/21/2021  6:10 AM      Failed - Valid encounter within last 6 months    Recent Outpatient Visits           10 months ago Hypokalemia   [facility] Grayce Sessions, NP   1 year ago Essential hypertension   [facility] Grayce Sessions, NP   1 year ago Essential hypertension   [facility] Grayce Sessions, NP   2 years ago Essential hypertension   [facility] Grayce Sessions, NP   2 years ago Essential hypertension   [facility] Grayce Sessions, NP              Passed - Last BP in normal range    BP Readings from Last 1 Encounters:  08/09/20 124/77           losartan-hydrochlorothiazide (HYZAAR) 100-25 MG tablet [Pharmacy Med Name: LOSARTAN-HCTZ 100-25 MG TAB] 30 tablet 1    Sig: TAKE ONE TABLET BY MOUTH DAILY     Cardiovascular: ARB + Diuretic Combos Failed - 06/21/2021  6:10 AM      Failed - K in normal range and within 180 days    Potassium  Date Value Ref Range Status  08/09/2020 4.1 3.5 - 5.2 mmol/L Final          Failed - Na in normal range and within 180 days    Sodium  Date Value Ref Range Status  08/09/2020 143 134 - 144 mmol/L Final          Failed - Cr in normal range and within 180 days    Creatinine, Ser  Date Value Ref Range  Status  08/09/2020 0.68 0.57 - 1.00 mg/dL Final          Failed - Ca in normal range and within 180 days    Calcium  Date Value Ref Range Status  08/09/2020 9.1 8.7 - 10.2 mg/dL Final          Failed - Valid encounter within last 6 months    Recent Outpatient Visits           10 months ago Hypokalemia   [facility] Grayce Sessions, NP   1 year ago Essential hypertension   [facility] Grayce Sessions, NP   1 year ago Essential hypertension   [facility] Grayce Sessions, NP   2 years ago Essential hypertension   [facility] Grayce Sessions, NP   2 years ago Essential hypertension   [facility] Grayce Sessions, NP              Passed - Patient is not pregnant  Passed - Last BP in normal range    BP Readings from Last 1 Encounters:  08/09/20 124/77

## 2021-06-22 ENCOUNTER — Other Ambulatory Visit (INDEPENDENT_AMBULATORY_CARE_PROVIDER_SITE_OTHER): Payer: Self-pay | Admitting: Primary Care

## 2021-06-22 DIAGNOSIS — Z760 Encounter for issue of repeat prescription: Secondary | ICD-10-CM

## 2021-06-22 DIAGNOSIS — I10 Essential (primary) hypertension: Secondary | ICD-10-CM

## 2021-06-22 MED ORDER — AMLODIPINE BESYLATE 10 MG PO TABS: 10.0000 mg | ORAL_TABLET | Freq: Every day | ORAL | 0 refills | Status: DC

## 2021-06-22 MED ORDER — LOSARTAN POTASSIUM-HCTZ 100-25 MG PO TABS: 1.0000 | ORAL_TABLET | Freq: Every day | ORAL | 0 refills | Status: DC

## 2021-06-22 NOTE — Telephone Encounter (Signed)
Patient is about to run out of medication. She is able to check Bp at home. She would like to have refill sent even if it is only enough pills to get her through until her scheduled appt on 1/31.

## 2021-06-22 NOTE — Telephone Encounter (Signed)
Patient checking  on the status of medication refill request. Patient will run out on  06/27/2021. Patient scheduled a 6 month follow up with PCP for 07/10/2021   [facility] [id number] [place], [place] - [address] Phone:  [phone]  Fax:  [phone]

## 2021-07-10 ENCOUNTER — Other Ambulatory Visit: Payer: Self-pay

## 2021-07-10 ENCOUNTER — Encounter (INDEPENDENT_AMBULATORY_CARE_PROVIDER_SITE_OTHER): Payer: Self-pay | Admitting: Primary Care

## 2021-07-10 ENCOUNTER — Ambulatory Visit (INDEPENDENT_AMBULATORY_CARE_PROVIDER_SITE_OTHER): Payer: Self-pay | Admitting: Primary Care

## 2021-07-10 VITALS — BP 119/76 | HR 67 | Temp 97.7°F | Ht 68.0 in | Wt 295.0 lb

## 2021-07-10 DIAGNOSIS — I10 Essential (primary) hypertension: Secondary | ICD-10-CM

## 2021-07-10 DIAGNOSIS — R202 Paresthesia of skin: Secondary | ICD-10-CM

## 2021-07-10 DIAGNOSIS — E876 Hypokalemia: Secondary | ICD-10-CM

## 2021-07-10 DIAGNOSIS — E782 Mixed hyperlipidemia: Secondary | ICD-10-CM

## 2021-07-10 DIAGNOSIS — E7800 Pure hypercholesterolemia, unspecified: Secondary | ICD-10-CM

## 2021-07-10 NOTE — Progress Notes (Signed)
Pt is fasting  She did take Bp medication this morning

## 2021-07-10 NOTE — Patient Instructions (Signed)

## 2021-07-11 LAB — LIPID PANEL
Chol/HDL Ratio: 4.4 ratio (ref 0.0–4.4)
Cholesterol, Total: 189 mg/dL (ref 100–199)
HDL: 43 mg/dL (ref >39)
LDL Chol Calc (NIH): 129 mg/dL — ABNORMAL HIGH (ref 0–99)
Triglycerides: 93 mg/dL (ref 0–149)
VLDL Cholesterol Cal: 17 mg/dL (ref 5–40)

## 2021-07-11 LAB — CMP14+EGFR
ALT: 8 IU/L (ref 0–32)
AST: 12 IU/L (ref 0–40)
Albumin/Globulin Ratio: 1.4 (ref 1.2–2.2)
Albumin: 4 g/dL (ref 3.8–4.9)
Alkaline Phosphatase: 75 IU/L (ref 44–121)
BUN/Creatinine Ratio: 23 (ref 9–23)
BUN: 16 mg/dL (ref 6–24)
Bilirubin Total: 0.2 mg/dL (ref 0.0–1.2)
CO2: 23 mmol/L (ref 20–29)
Calcium: 9.2 mg/dL (ref 8.7–10.2)
Chloride: 105 mmol/L (ref 96–106)
Creatinine, Ser: 0.7 mg/dL (ref 0.57–1.00)
Globulin, Total: 2.9 g/dL (ref 1.5–4.5)
Glucose: 101 mg/dL — ABNORMAL HIGH (ref 70–99)
Potassium: 4 mmol/L (ref 3.5–5.2)
Sodium: 141 mmol/L (ref 134–144)
Total Protein: 6.9 g/dL (ref 6.0–8.5)
eGFR: 101 mL/min/{1.73_m2} (ref >59)

## 2021-07-11 LAB — VITAMIN B12: Vitamin B-12: 1012 pg/mL (ref 232–1245)

## 2021-07-11 NOTE — Progress Notes (Signed)
[place], is a 56 y.o. female  PIR:[id number]  YTK:[id number]  DOB - May 03, 1966  Chief Complaint  Patient presents with   Hypertension       Subjective:   [name] is a 56 y.o. female here today for a follow up visit. Patient has No headache, No chest pain, No abdominal pain - No Nausea, No new weakness tingling or numbness, No Cough - SOB.  No problems updated.  ALLERGIES: No Known Allergies  PAST MEDICAL HISTORY: Past Medical History:  Diagnosis Date   Anxiety    Arthritis    Patient verbalizes she has athritis to right ankle   Dyspnea    Frozen shoulder syndrome 07/30/2019   Hypertension    Obesity     MEDICATIONS AT HOME: Prior to Admission medications   Medication Sig Start Date End Date Taking? Authorizing Provider  acetaminophen (TYLENOL) 500 MG tablet Take 500 mg by mouth every 6 (six) hours as needed for moderate pain.   Yes [provider]  amLODipine (NORVASC) 10 MG tablet Take 1 tablet (10 mg total) by mouth daily. 06/22/21  Yes Kerin Perna, NP  losartan-hydrochlorothiazide (HYZAAR) 100-25 MG tablet Take 1 tablet by mouth daily. 06/22/21  Yes Kerin Perna, NP    Objective:   Vitals:   07/10/21 1105  BP: 119/76  Pulse: 67  Temp: 97.7 F (36.5 C)  TempSrc: Oral  SpO2: 97%  Weight: 295 lb (133.8 kg)  Height: _0  (1.727 m)   Exam General appearance : Awake, alert, not in any distress. Speech Clear. Not toxic looking HEENT: Atraumatic and Normocephalic, pupils equally reactive to light and accomodation Neck: Supple, no JVD. No cervical lymphadenopathy.  Chest: Good air entry bilaterally, no added sounds  CVS: S1 S2 regular, no murmurs.  Abdomen: Bowel sounds present, Non tender and not distended with no gaurding, rigidity or rebound. Extremities: B/L Lower Ext shows no edema, both legs are warm to touch Neurology: Awake alert, and oriented X 3,  Non focal Skin: No Rash  Data  Review No results found for: HGBA1C  Assessment & Plan   1. Hypokalemia - CMP14+EGFR  2. Essential hypertension Counseled on blood pressure goal of less than 130/80, low-sodium, DASH diet, medication compliance, 150 minutes of moderate intensity exercise per week. Discussed medication compliance, adverse effects.    4. Mixed hyperlipidemia Healthy lifestyle diet of fruits vegetables fish nuts whole grains and low saturated fat . Foods high in cholesterol or liver, fatty meats,cheese, butter avocados, nuts and seeds, chocolate and fried foods. - Lipid Panel  5. Paresthesia Pricking, tingling, and numbness in any section of the body.It is an anomalous condition characterized by sensations of itching, burning, tingling, prickling, or numbness. Paresthesia may also be described as skin-crawling or pins-and-needles sensations.  - Vitamin B12  Patient have been counseled extensively about nutrition and exercise. Other issues discussed during this visit include: low cholesterol diet, weight control and daily exercise, foot care, annual eye examinations at Ophthalmology, importance of adherence with medications and regular follow-up. We also discussed long term complications of uncontrolled diabetes and hypertension.   Return in about 3 months (around 10/10/2021) for weight management.  The patient was given clear instructions to go to ER or return to medical center if symptoms don't improve, worsen or new problems develop. The patient verbalized understanding. The patient was told to call to get lab results if they haven't heard anything in the next week.   This  note has been created with Surveyor, quantity. Any transcriptional errors are unintentional.   Kerin Perna, NP 07/11/2021, 4:48 PM

## 2021-08-01 ENCOUNTER — Telehealth: Payer: Self-pay

## 2021-08-01 NOTE — Telephone Encounter (Signed)
Patient contacted to schedule mammogram.  ° °Not able to lvm - vm full. ° °RE: Mobile Mammo event located at: ° °[facility]  [facility]  °      °[address]    °[place] [address]    ° °Date: April 7th  ° ° °

## 2021-08-04 ENCOUNTER — Other Ambulatory Visit (INDEPENDENT_AMBULATORY_CARE_PROVIDER_SITE_OTHER): Payer: Self-pay | Admitting: Primary Care

## 2021-08-04 DIAGNOSIS — I10 Essential (primary) hypertension: Secondary | ICD-10-CM

## 2021-08-04 DIAGNOSIS — Z760 Encounter for issue of repeat prescription: Secondary | ICD-10-CM

## 2021-08-11 ENCOUNTER — Other Ambulatory Visit: Payer: Self-pay

## 2021-08-11 ENCOUNTER — Encounter (HOSPITAL_COMMUNITY): Payer: Self-pay

## 2021-08-11 ENCOUNTER — Emergency Department (HOSPITAL_COMMUNITY)
Admission: EM | Admit: 2021-08-11 | Discharge: 2021-08-11 | Disposition: A | Discharge status: Home / Self Care | Payer: Medicaid Other | Attending: Emergency Medicine | Admitting: Emergency Medicine

## 2021-08-11 ENCOUNTER — Emergency Department (HOSPITAL_COMMUNITY): Payer: Medicaid Other

## 2021-08-11 DIAGNOSIS — R1011 Right upper quadrant pain: Secondary | ICD-10-CM

## 2021-08-11 DIAGNOSIS — K8020 Calculus of gallbladder without cholecystitis without obstruction: Secondary | ICD-10-CM

## 2021-08-11 DIAGNOSIS — I10 Essential (primary) hypertension: Secondary | ICD-10-CM | POA: Insufficient documentation

## 2021-08-11 DIAGNOSIS — F17210 Nicotine dependence, cigarettes, uncomplicated: Secondary | ICD-10-CM | POA: Insufficient documentation

## 2021-08-11 LAB — COMPREHENSIVE METABOLIC PANEL
ALT: 11 U/L (ref 0–44)
AST: 15 U/L (ref 15–41)
Albumin: 3.4 g/dL — ABNORMAL LOW (ref 3.5–5.0)
Alkaline Phosphatase: 51 U/L (ref 38–126)
Anion gap: 9 (ref 5–15)
BUN: 19 mg/dL (ref 6–20)
CO2: 26 mmol/L (ref 22–32)
Calcium: 8.8 mg/dL — ABNORMAL LOW (ref 8.9–10.3)
Chloride: 104 mmol/L (ref 98–111)
Creatinine, Ser: 0.9 mg/dL (ref 0.44–1.00)
GFR, Estimated: >60 mL/min (ref >60)
Glucose, Bld: 133 mg/dL — ABNORMAL HIGH (ref 70–99)
Potassium: 3.2 mmol/L — ABNORMAL LOW (ref 3.5–5.1)
Sodium: 139 mmol/L (ref 135–145)
Total Bilirubin: 0.3 mg/dL (ref 0.3–1.2)
Total Protein: 6.7 g/dL (ref 6.5–8.1)

## 2021-08-11 LAB — URINALYSIS, ROUTINE W REFLEX MICROSCOPIC
Bilirubin Urine: NEGATIVE
Glucose, UA: NEGATIVE mg/dL
Ketones, ur: NEGATIVE mg/dL
Nitrite: NEGATIVE
Protein, ur: NEGATIVE mg/dL
Specific Gravity, Urine: 1.017 (ref 1.005–1.030)
pH: 5 (ref 5.0–8.0)

## 2021-08-11 LAB — CBC WITH DIFFERENTIAL/PLATELET
Abs Immature Granulocytes: 0.06 10*3/uL (ref 0.00–0.07)
Basophils Absolute: 0 10*3/uL (ref 0.0–0.1)
Basophils Relative: 0 %
Eosinophils Absolute: 0.3 10*3/uL (ref 0.0–0.5)
Eosinophils Relative: 2 %
HCT: 41.3 % (ref 36.0–46.0)
Hemoglobin: 13.6 g/dL (ref 12.0–15.0)
Immature Granulocytes: 1 %
Lymphocytes Relative: 28 %
Lymphs Abs: 3.5 10*3/uL (ref 0.7–4.0)
MCH: 30.2 pg (ref 26.0–34.0)
MCHC: 32.9 g/dL (ref 30.0–36.0)
MCV: 91.8 fL (ref 80.0–100.0)
Monocytes Absolute: 1.1 10*3/uL — ABNORMAL HIGH (ref 0.1–1.0)
Monocytes Relative: 9 %
Neutro Abs: 7.5 10*3/uL (ref 1.7–7.7)
Neutrophils Relative %: 60 %
Platelets: 192 10*3/uL (ref 150–400)
RBC: 4.5 MIL/uL (ref 3.87–5.11)
RDW: 13.2 % (ref 11.5–15.5)
WBC: 12.5 10*3/uL — ABNORMAL HIGH (ref 4.0–10.5)
nRBC: 0 % (ref 0.0–0.2)

## 2021-08-11 LAB — LIPASE, BLOOD: Lipase: 37 U/L (ref 11–51)

## 2021-08-11 MED ORDER — POTASSIUM CHLORIDE 10 MEQ/100ML IV SOLN
10.0000 meq | INTRAVENOUS | Status: DC
  Administered 2021-08-11: 10 meq via INTRAVENOUS
  Filled 2021-08-11: qty 100

## 2021-08-11 MED ORDER — POTASSIUM CHLORIDE CRYS ER 20 MEQ PO TBCR
40.0000 meq | EXTENDED_RELEASE_TABLET | Freq: Once | ORAL | Status: AC
  Administered 2021-08-11: 40 meq via ORAL
  Filled 2021-08-11: qty 2

## 2021-08-11 MED ORDER — LACTATED RINGERS IV BOLUS
1000.0000 mL | Freq: Once | INTRAVENOUS | Status: AC
  Administered 2021-08-11: 1000 mL via INTRAVENOUS

## 2021-08-11 NOTE — Discharge Instructions (Addendum)
The ultrasound of your gallbladder revealed gallstones but no appreciable blockage or gallbladder inflammation that would require surgery today to remove your gallbladder.  However you may require gallbladder removal in the outpatient setting. ? ?Please call [phone] to follow-up with [facility] [place] [facility] regarding your gallstones and abdominal pain.  If your abdominal pain recurs and does not subside in less than 15 minutes, please return to the ED for evaluation as this may indicate a more acute process.  In the interim, we recommend decreasing foods with high fat content as this will strain your gallbladder and may cause recurrence of pain. ? ?Please see the attached information regarding cholelithiasis (gallstones). ?

## 2021-08-11 NOTE — ED Provider Notes (Signed)
[facility] EMERGENCY DEPARTMENT Provider Note  History   Chief Complaint  Patient presents with   Abdominal Pain   Sharon Dyer is a 56 y.o. female w/ h/o anxiety, HTN, obesity who p/w acute RUQ pain.  The history is provided by the patient.  Illness Location:  RUQ abdominal pain Quality:  Sharp Severity:  Moderate Onset quality:  Sudden Duration: 1-2 hrs. Timing:  Constant Progression:  Resolved Chronicity:  New Context:  See MDM Associated symptoms: abdominal pain and vomiting ((Patient made herself vomit x2 this morning))   Associated symptoms: no chest pain, no cough, no diarrhea, no ear pain, no fever, no nausea, no rash, no shortness of breath and no sore throat     Past Medical History:  Diagnosis Date   Anxiety    Arthritis    Patient verbalizes she has athritis to right ankle   Dyspnea    Frozen shoulder syndrome 07/30/2019   Hypertension    Obesity     Social History   Tobacco Use   Smoking status: Every Day    Packs/day: 1.00    Years: 37.00    Pack years: 37.00    Types: Cigarettes   Smokeless tobacco: Never  Vaping Use   Vaping Use: Never used  Substance Use Topics   Alcohol use: Yes    Comment: 12/22/2017 "maybe twice a year"   Drug use: Never     Family History  Problem Relation Age of Onset   Hypertension Mother     Review of Systems  Constitutional:  Negative for chills and fever.  HENT:  Negative for ear pain and sore throat.   Eyes:  Negative for photophobia, pain and visual disturbance.  Respiratory:  Negative for cough and shortness of breath.   Cardiovascular:  Negative for chest pain and palpitations.  Gastrointestinal:  Positive for abdominal pain and vomiting ((Patient made herself vomit x2 this morning)). Negative for blood in stool, constipation, diarrhea and nausea.  Endocrine: Negative.   Genitourinary:  Negative for dysuria, flank pain, hematuria and pelvic pain.  Musculoskeletal:  Negative for neck  pain and neck stiffness.  Skin:  Negative for color change and rash.  Allergic/Immunologic: Negative.   Neurological:  Negative for dizziness, seizures and syncope.  Hematological: Negative.   Psychiatric/Behavioral: Negative.    All other systems reviewed and are negative.   Physical Exam   Today's Vitals   08/11/21 0505 08/11/21 0506  BP:  111/62  Pulse:  70  Resp:  18  Temp:  97.6 F (36.4 C)  TempSrc:  Oral  SpO2:  93%  PainSc: 10-Worst pain ever      Physical Exam Constitutional:      General: She is not in acute distress.    Appearance: She is obese. She is not ill-appearing or toxic-appearing.  HENT:     Head: Normocephalic and atraumatic.     Nose: Nose normal. No congestion.     Mouth/Throat:     Mouth: Mucous membranes are moist.     Pharynx: Oropharynx is clear. No oropharyngeal exudate.  Eyes:     Extraocular Movements: Extraocular movements intact.     Conjunctiva/sclera: Conjunctivae normal.     Pupils: Pupils are equal, round, and reactive to light.  Cardiovascular:     Rate and Rhythm: Regular rhythm.     Pulses: Normal pulses.     Heart sounds: Normal heart sounds. No murmur heard. Pulmonary:     Effort: Pulmonary effort is normal.  No respiratory distress.     Breath sounds: Normal breath sounds. No stridor. No wheezing, rhonchi or rales.  Abdominal:     Tenderness: There is no abdominal tenderness. There is no right CVA tenderness, left CVA tenderness, guarding or rebound. Negative signs include Murphy's sign and McBurney's sign.     Hernia: No hernia is present.  Musculoskeletal:        General: Normal range of motion.     Cervical back: Normal range of motion and neck supple. No tenderness.     Right lower leg: No edema.     Left lower leg: No edema.  Skin:    General: Skin is warm and dry.     Capillary Refill: Capillary refill takes less than 2 seconds.     Findings: No rash.  Neurological:     General: No focal deficit present.      Mental Status: She is alert and oriented to person, place, and time. Mental status is at baseline.     Cranial Nerves: No cranial nerve deficit.     Sensory: No sensory deficit.     Motor: No weakness.     Coordination: Coordination normal.  Psychiatric:        Mood and Affect: Mood normal.        Behavior: Behavior normal.     ED Course  Procedures  Medical Decision Making:  Sharon Dyer is a 56 y.o. female w/ h/o anxiety, HTN, obesity who p/w acute RUQ pain.  No history of abdominal surgeries No history of known gallstones No significant EtOH history Went to bed last night in her normal state of health Awoke this morning at 4 AM with acute onset of intense sharp right upper quadrant abdominal pain, non-radiating Patient states in the past when she has "stomachaches", at times she feels better after vomiting Patient was very clear that she did not feel nauseous, but she made herself throw up twice in an attempt to relieve her abdominal pain, however her pain did not subside with these efforts. Patient endorses normal bowel movements, no blood in stool, no diarrhea No suspicious ingestions prior to this Patient states she did not take her morning medications (including antihypertensives, and in the ED her BP is 95/64 which is "lower than her normal" She denies any preceding GI losses (with the exception of emesis x2 this morning), and she endorses appropriate p.o. intake She denies fever, chest pain, shortness of breath, syncope, lightheadedness, dysuria, hematuria, blood in stool, vaginal pain, abnormal vaginal discharge, preceding trauma, or sick contacts On examination at 8 AM, patient states she is pain-free and "feeling back to normal."  On abdominal examination, she is soft and nontender.  No flank pain.  Patient is mentating well, and has a nonfocal neurologic examination.  Work-up as below was obtained in triage prior to patient being bedded in the ED.  Agree with work-up, no  further diagnostic testing required.  IVF for soft BP, improved with IVF bolus. Ordered p.o. potassium repletion  ER provider interpretation of Imaging / Radiology:  RUQ ultrasound: Reveals cholelithiasis without cholecystitis.  Mild dilatation of common bile duct (7.4 mm), if there is clinical c/f choledocholithiasis or other cause of common bile duct dilation, recommend further evaluation with abdominal MRI with and without IV gadolinium with MRCP should be considered.  ER provider interpretation of EKG:  Not indicated  ER provider interpretation of Labs:  CBC: WBC 12.5, Hgb 13.6 CMP: K3.2, BG 133, no AKI, no elevated  LFTs, T. bili 0.3, AG 9 Lipase: Within normal range UA: Small blood, large leukocytes, WBC 6-10, rare bacteria  Key medications administered in the ER:  Medications  potassium chloride SA (KLOR-CON M) CR tablet 40 mEq (has no administration in time range)  lactated ringers bolus 1,000 mL (1,000 mLs Intravenous New Bag/Given 08/11/21 0801)   Diagnoses considered: Etiology likely cholelithiasis/biliary colic.  Not symptomatic at this time, pain has resolved.  Do not suspect choledocholithiasis as patient pain has resolved, no elevated AST/ALT/AP/bilirubin, no jaundice on examination.  Do not suspect acute cholecystitis as pain has resolved, no radiation of pain, no anorexia, no nausea, and not supported on ultrasound as above.  Do not suspect infection as patient is afebrile, and had no preceding fever, and pain has resolved.  Do not suspect UTI despite UA with large leukocytes and rare bacteria as patient is asymptomatic.  Do not suspect bowel obstruction as patient is passing gas on examination.  Do not suspect AAA as patient's pain is resolved, alternate explanation for pain, and no history of AAA.  Do not suspect ACS as patient has no chest pain, and alternate explanation for abdominal pain.  Do not suspect PNA as lungs are clear without auscultatory findings of focal  consolidation.  Do not suspect pyelonephritis as patient without urinary symptoms, no flank pain, no fever.  Do not suspect nephrolithiasis as patient has no history of the same, and no flank tenderness.  Do not suspect pancreatitis as no epigastric TTP on examination, and lipase within normal range.  Do not suspect shingles as no overlying skin findings.  Do not suspect perforated bowel or ulcer as no TTP on examination or rebound/guarding.  Do not suspect ischemic mesentery as no pain out of proportion.  Do not suspect IBD as no history of the same, no bowel changes, no blood in stool.  Do not suspect strangulated/incarcerated hernia as not supported by patient examination.  Patient without peritoneal signs or other indication of need for surgical intervention.  Consulted: Not indicated at this time  Plan to place outpatient referral to general surgery given cholelithiasis. Patient was able to tolerate p.o. in the ED prior to discharge On serial abdominal exam reassessment, patient remains without focal tenderness to palpation or complaints of pain.  Key discharge instructions: Spoke to patient at bedside, all questions were answered at this time, close return precautions given, and patient voiced understanding and agreement with plan. Patient discharged in stable condition.   Patient seen in conjunction with Dr. Cheron Schaumann medical dictation software was used in the creation of this note.   Electronically signed by: Drake Leach, MD on 08/11/2021 at 7:45 AM  Clinical Impression:  1. Calculus of gallbladder without cholecystitis without obstruction   2. RUQ pain     Dispo: Discharge    Drake Leach, MD 08/11/21 4818    Cathren Laine, MD 08/11/21 [phone]

## 2021-08-11 NOTE — ED Provider Triage Note (Addendum)
Emergency Medicine Provider Triage Evaluation Note ? ?[name] , a 56 y.o. female  was evaluated in triage brought in by EMS.  Pt complains of abdominal pain onset 1 hour ago. Has associated nausea, vomiting (patient made herself throw up). Denies fever, chills, nausea, vomiting, chest pain, shortness of breath, dysuria, hematuria.  Patient notes that she still has her gallbladder.  Patient she has a history of GERD and intermittently takes Carafate.  Patient was given 4 mg Zofran by EMS prior to arrival to the ED. ? ?Review of Systems  ?Positive: As per HPI above ?Negative: As per HPI above ? ?Physical Exam  ?BP 111/62   Pulse 70   Temp 97.6 ?F (36.4 ?C) (Oral)   Resp 18   LMP 01/12/2015   SpO2 93%  ?Gen:   Awake, no distress   ?Resp:  Normal effort  ?MSK:   Moves extremities without difficulty  ?Other:  Tenderness to palpation noted to epigastric region and right upper quadrant region. ? ?Medical Decision Making  ?Medically screening exam initiated at 5:07 AM.  Appropriate orders placed.  Larri A Clapp was informed that the remainder of the evaluation will be completed by another provider, this initial triage assessment does not replace that evaluation, and the importance of remaining in the ED until their evaluation is complete. ?  ?Caci Orren A, PA-C ?08/11/21 5056 ? ?  ?Nadina Fomby A, PA-C ?08/11/21 0518 ? ?

## 2021-08-11 NOTE — ED Triage Notes (Signed)
Pt here via EMS from home d/t abdominal pain with N/V X1 hour. Woke suddenly from sleep. 10/10 pain.  ? ?142/78 ?HR 72 ?RR 20  ?97% RA ?20g RT hand ? ?EMS gave 4mg  zofran ?

## 2021-08-24 ENCOUNTER — Encounter (INDEPENDENT_AMBULATORY_CARE_PROVIDER_SITE_OTHER): Payer: Self-pay | Admitting: Primary Care

## 2021-08-24 ENCOUNTER — Ambulatory Visit (INDEPENDENT_AMBULATORY_CARE_PROVIDER_SITE_OTHER): Payer: Self-pay | Admitting: Primary Care

## 2021-08-24 ENCOUNTER — Other Ambulatory Visit: Payer: Self-pay

## 2021-08-24 VITALS — BP 129/76 | HR 67 | Temp 97.9°F | Ht 68.0 in | Wt 289.6 lb

## 2021-08-24 DIAGNOSIS — Z09 Encounter for follow-up examination after completed treatment for conditions other than malignant neoplasm: Secondary | ICD-10-CM

## 2021-08-24 DIAGNOSIS — I10 Essential (primary) hypertension: Secondary | ICD-10-CM

## 2021-08-24 DIAGNOSIS — Z760 Encounter for issue of repeat prescription: Secondary | ICD-10-CM

## 2021-08-24 NOTE — Progress Notes (Signed)
?[facility] ? ? ?Subjective:  ?Sharon Dyer is a 56 y.o. female presents for emergency room for RUQ pain with nausea and vomiting the day of the excruciating pain. Presented to the ED on   08/11/21, and dx with  ?RUQ pain Calculus of gallbladder without cholecystitis without obstruction. Today she is smiling and feeling a lot better. Patient has No headache, No chest pain, No abdominal pain - No Nausea, No new weakness tingling or numbness, No Cough - or shortness of breath. ?Past Medical History:  ?Diagnosis Date  ? Anxiety   ? Arthritis   ? Patient verbalizes she has athritis to right ankle  ? Dyspnea   ? Frozen shoulder syndrome 07/30/2019  ? Hypertension   ? Obesity   ?  ? ?No Known Allergies ?Schedule an appointment with [facility] (General Surgery) in 1 week (08/18/2021) ?Schedule an appointment with Grayce Sessions, NP (Internal Medicine) in 2 days (08/13/2021) ?  ?Current Outpatient Medications on File Prior to Visit  ?Medication Sig Dispense Refill  ? amLODipine (NORVASC) 10 MG tablet TAKE ONE TABLET BY MOUTH DAILY; **MUST CALL MD FOR APPOINTMENT FOR FUTURE REFILLS ! 90 tablet 0  ? losartan-hydrochlorothiazide (HYZAAR) 100-25 MG tablet TAKE ONE TABLET BY MOUTH DAILY 90 tablet 0  ? acetaminophen (TYLENOL) 500 MG tablet Take 500 mg by mouth every 6 (six) hours as needed for moderate pain.    ? ?No current facility-administered medications on file prior to visit.  ? ? ? ?Review of System: ?Comprehensive ROS Pertinent positive and negative noted in HPI   ? ?Objective:  ?BP 129/76 (BP Location: Right Arm, Patient Position: Sitting, Cuff Size: Large)   Pulse 67   Temp 97.9 ?F (36.6 ?C) (Oral)   Ht 5\' 8"  (1.727 m)   Wt 289 lb 9.6 oz (131.4 kg)   LMP 01/12/2015   SpO2 98%   BMI 44.03 kg/m?  ? 03/14/2015 Weights  ? 08/24/21 0945  ?Weight: 289 lb 9.6 oz (131.4 kg)  ? ? ?Physical Exam: ?General Appearance: Well nourished, in no apparent distress. ?Eyes: PERRLA, EOMs, conjunctiva  no swelling or erythema ?Sinuses: No Frontal/maxillary tenderness ?ENT/Mouth: Ext aud canals clear, TMs without erythema, bulging. No erythema, swelling, or exudate on post pharynx.  Tonsils not swollen or erythematous. Hearing normal.  ?Neck: Supple, thyroid normal.  ?Respiratory: Respiratory effort normal, BS equal bilaterally without rales, rhonchi, wheezing or stridor.  ?Cardio: RRR with no MRGs. Brisk peripheral pulses without edema.  ?Abdomen: Soft, + BS.  Non tender, no guarding, rebound, hernias, masses. ?Lymphatics: Non tender without lymphadenopathy.  ?Musculoskeletal: Full ROM, 5/5 strength, normal gait.  ?Skin: Warm, dry without rashes, lesions, ecchymosis.  ?Neuro: Cranial nerves intact. Normal muscle tone, no cerebellar symptoms. Sensation intact.  ?Psych: Awake and oriented X 3, normal affect, Insight and Judgment appropriate.  ? ? ?Assessment: ? ?  ?Sharon Dyer was seen today for hospitalization follow-up. ? ?Diagnoses and all orders for this visit: ? ?[name] ?Retrieved from d/c  ?Schedule an appointment with [facility] (General Surgery) in 1 week (08/18/2021) ?Schedule an appointment with 10/18/2021, NP (Internal Medicine) in 2 days (08/13/2021) ?  ?Medication refill ?-     losartan-hydrochlorothiazide (HYZAAR) 100-25 MG tablet; Take 1 tablet by mouth daily. ?-     amLODipine (NORVASC) 10 MG tablet; TAKE ONE TABLET BY MOUTH DAILY; **MUST CALL MD FOR APPOINTMENT FOR FUTURE REFILLS ! ? ?Essential hypertension ?We have discussed target BP range and she is at  blood pressure goal. I have advised patient to check BP regularly and to call us back or report to clinic if the numbers are consistently higher than 140/90. We discussed the importance of compliance with medical therapy and DASH diet recommended, consequences of uncontrolled hypertension discussed.  ?- continue current BP medications  ?  ? ?This note has been created with Print production planner. Any transcriptional errors are unintentional.  ? ?Grayce Sessions, NP ?08/24/2021, 10:00 AM ?  ? ?

## 2021-08-24 NOTE — Patient Instructions (Signed)
Cholecystitis ?Cholecystitis is irritation and swelling (inflammation) of the gallbladder. The gallbladder: ?Is an organ that is shaped like a pear. ?Is under the liver on the right side of the body. ?Stores bile. Bile helps the body break down (digest) the fats in food. ?This condition can occur all of a sudden. It needs to be treated. ?What are the causes? ?This condition may be caused by stones or lumps that form in the gallbladder (gallstones). Gallstones can block the tube (duct) that carries bile out of your gallbladder. ?Other causes include: ?Damage to the gallbladder due to less blood flow. ?Germs in the bile ducts. ?Scars, kinks, or adhesions in the bile ducts. ?Abnormal growths (tumors) in the liver, pancreas, or gallbladder. ?What increases the risk? ?You are more likely to develop this condition if: ?You are female and between the ages of 55-62. ?You take birth control pills. ?You use estrogen. ?You take certain medicines that make you more likely to develop gallstones. ?You are overweight (obese). ?You have a very bad reaction to an infection (sepsis). ?You have been hospitalized due to a serious condition, such as a burn or illness. ?You have not eaten or drank for a long time. ?What are the signs or symptoms? ?Symptoms of this condition include: ?Pain in the upper right part of the belly (abdomen). ?A lump over the gallbladder. ?Bloating in the belly. ?Feeling sick to your stomach (nauseous). ?Vomiting. ?Fever. ?Chills. ?How is this treated? ?This condition may be treated with: ?Medicines to treat pain. ?Giving fluids through an IV tube. ?Not eating or drinking (fasting). ?Antibiotic medicines. ?Surgery to take out your gallbladder. ?Gallbladder drainage. ?Follow these instructions at home: ?Medicines ? ?Take over-the-counter and prescription medicines only as told by your doctor. ?If you were prescribed an antibiotic medicine, take it as told by your doctor. Do not stop taking it even if you start  to feel better. ?General instructions ?Follow instructions from your doctor about what to eat or drink. Do not eat or drink anything that makes you sick again. ?Do not smoke or use any products that contain nicotine or tobacco. If you need help quitting, ask your doctor. ?Keep all follow-up visits. ?Contact a doctor if: ?You have pain and your medicine does not help. ?You have a fever. ?Get help right away if: ?Your pain moves to: ?Another part of your belly. ?Your back. ?Your symptoms do not go away. ?You have new symptoms. ?These symptoms may be an emergency. Get help right away. Call 911. ?Do not wait to see if the symptoms will go away. ?Do not drive yourself to the hospital. ?Summary ?This condition may be caused by stones or lumps that form in the gallbladder (gallstones). ?A common symptom is pain in your belly. ?This condition may be treated with surgery to take out your gallbladder. ?Follow instructions from your doctor about what to eat or drink. ?This information is not intended to replace advice given to you by your health care provider. Make sure you discuss any questions you have with your health care provider. ?Document Revised: 11/28/2020 Document Reviewed: 11/28/2020 ?Elsevier Patient Education ? 2022 [employer] ? ?

## 2021-08-29 MED ORDER — LOSARTAN POTASSIUM-HCTZ 100-25 MG PO TABS: 1.0000 | ORAL_TABLET | Freq: Every day | ORAL | 1 refills | Status: DC

## 2021-08-29 MED ORDER — AMLODIPINE BESYLATE 10 MG PO TABS: ORAL_TABLET | ORAL | 1 refills | Status: DC

## 2021-10-11 ENCOUNTER — Encounter (INDEPENDENT_AMBULATORY_CARE_PROVIDER_SITE_OTHER): Payer: Self-pay | Admitting: Primary Care

## 2021-10-11 ENCOUNTER — Ambulatory Visit (INDEPENDENT_AMBULATORY_CARE_PROVIDER_SITE_OTHER): Payer: Self-pay | Admitting: Primary Care

## 2021-10-11 DIAGNOSIS — Z760 Encounter for issue of repeat prescription: Secondary | ICD-10-CM

## 2021-10-11 DIAGNOSIS — Z1231 Encounter for screening mammogram for malignant neoplasm of breast: Secondary | ICD-10-CM

## 2021-10-11 DIAGNOSIS — I10 Essential (primary) hypertension: Secondary | ICD-10-CM

## 2021-10-11 MED ORDER — AMLODIPINE BESYLATE 10 MG PO TABS: ORAL_TABLET | ORAL | 1 refills | Status: DC

## 2021-10-11 NOTE — Progress Notes (Signed)
?[facility] ? ?Sharon Dyer, is a 57 y.o. female ? ?HFW:[id number] ? ?OYD:[id number] ? ?DOB - 1965/11/14 ? ?Chief Complaint  ?Patient presents with  ? Weight Management Screening  ?    ? ?Subjective:  ? ?Sharon Dyer is a 56 y.o. morbid obese female here today for a follow up visit for weight and HTN. Previous visit challenged her to loose 10 lbs by f/u . She lost 9 very please by changing her diet and exercising. Patient has No headache, No chest pain, No abdominal pain - No Nausea, No new weakness tingling or numbness, No Cough - shortness of breath ? ?No problems updated. ? ?No Known Allergies ? ?Past Medical History:  ?Diagnosis Date  ? Anxiety   ? Arthritis   ? Patient verbalizes she has athritis to right ankle  ? Dyspnea   ? Frozen shoulder syndrome 07/30/2019  ? Hypertension   ? Obesity   ? ? ?Current Outpatient Medications on File Prior to Visit  ?Medication Sig Dispense Refill  ? acetaminophen (TYLENOL) 500 MG tablet Take 500 mg by mouth every 6 (six) hours as needed for moderate pain.    ? losartan-hydrochlorothiazide (HYZAAR) 100-25 MG tablet Take 1 tablet by mouth daily. 90 tablet 1  ? ?No current facility-administered medications on file prior to visit.  ? ? ?Objective:  ? ?Vitals:  ? 10/11/21 0930  ?BP: 120/75  ?Pulse: 62  ?Temp: 98 ?F (36.7 ?C)  ?TempSrc: Oral  ?SpO2: 94%  ?Weight: 280 lb 9.6 oz (127.3 kg)  ?Height: 5\' 8"  (1.727 m)  ? ? ?Exam ?General appearance : Awake, alert, not in any distress. Speech Clear. Not toxic looking ?HEENT: Atraumatic and Normocephalic, pupils equally reactive to light and accomodation ?Neck: Supple, no JVD. No cervical lymphadenopathy.  ?Chest: Good air entry bilaterally, no added sounds  ?CVS: S1 S2 regular, no murmurs.  ?Abdomen: Bowel sounds present, Non tender and not distended with no gaurding, rigidity or rebound. ?Extremities: B/L Lower Ext shows no edema, both legs are warm to touch ?Neurology: Awake alert, and oriented X 3, CN II-XII intact,  Non focal ?Skin: No Rash ? ?Data Review ?No results found for: HGBA1C ? ?Assessment & Plan  ?Sharon Dyer was seen today for weight management screening. ? ?Diagnoses and all orders for this visit: ? ?Morbid obesity (HCC) ?9 lbs weight loss  by changing her diet and exercising. Next challenge is to continue to loose weight with keeping her modified life style changes. ? ?Medication refill ?-     amLODipine (NORVASC) 10 MG tablet; TAKE ONE TABLET BY MOUTH DAILY ? ?Essential hypertension ?Blood pressure is at goal of less than 130/80, low-sodium, DASH diet, medication compliance, 150 minutes of moderate intensity exercise per week. ?Discussed medication compliance, adverse effects.  ?  ?Breast cancer screening by mammogram ?Patient completed application for BCCP while in clinic and application has and faxed to [facility]. Patient aware that [facility] will contact her directly to schedule appointment.  ?-     MM DIGITAL SCREENING BILATERAL; Future ? ?  ? ?Patient have been counseled extensively about nutrition and exercise. Other issues discussed during this visit include: low cholesterol diet, weight control and daily exercise, foot care, annual eye examinations at Ophthalmology, importance of adherence with medications and regular follow-up. We also discussed long term complications of uncontrolled diabetes and hypertension.  ? ?Return in about 6 months (around 04/13/2022) for Bp/fasting labs. ? ?The patient was given clear instructions to go to ER or return to medical center  if symptoms don't improve, worsen or new problems develop. The patient verbalized understanding. The patient was told to call to get lab results if they haven't heard anything in the next week.  ? ?This note has been created with Education officer, environmental. Any transcriptional errors are unintentional.  ? ?Grayce Sessions, NP ?10/11/2021, 10:01 AM  ?

## 2021-10-12 ENCOUNTER — Emergency Department (HOSPITAL_COMMUNITY)
Admission: EM | Admit: 2021-10-12 | Discharge: 2021-10-13 | Disposition: A | Discharge status: Home / Self Care | Payer: Medicaid Other | Attending: Emergency Medicine | Admitting: Emergency Medicine

## 2021-10-12 ENCOUNTER — Other Ambulatory Visit: Payer: Self-pay

## 2021-10-12 ENCOUNTER — Encounter (HOSPITAL_COMMUNITY): Payer: Self-pay

## 2021-10-12 DIAGNOSIS — R0602 Shortness of breath: Secondary | ICD-10-CM | POA: Insufficient documentation

## 2021-10-12 DIAGNOSIS — I10 Essential (primary) hypertension: Secondary | ICD-10-CM | POA: Insufficient documentation

## 2021-10-12 DIAGNOSIS — K8020 Calculus of gallbladder without cholecystitis without obstruction: Secondary | ICD-10-CM | POA: Insufficient documentation

## 2021-10-12 DIAGNOSIS — Z79899 Other long term (current) drug therapy: Secondary | ICD-10-CM | POA: Insufficient documentation

## 2021-10-12 DIAGNOSIS — E876 Hypokalemia: Secondary | ICD-10-CM | POA: Insufficient documentation

## 2021-10-12 DIAGNOSIS — K8050 Calculus of bile duct without cholangitis or cholecystitis without obstruction: Secondary | ICD-10-CM

## 2021-10-12 DIAGNOSIS — T502X5A Adverse effect of carbonic-anhydrase inhibitors, benzothiadiazides and other diuretics, initial encounter: Secondary | ICD-10-CM | POA: Insufficient documentation

## 2021-10-12 LAB — CBC WITH DIFFERENTIAL/PLATELET
Abs Immature Granulocytes: 0.11 10*3/uL — ABNORMAL HIGH (ref 0.00–0.07)
Basophils Absolute: 0.1 10*3/uL (ref 0.0–0.1)
Basophils Relative: 0 %
Eosinophils Absolute: 0.3 10*3/uL (ref 0.0–0.5)
Eosinophils Relative: 2 %
HCT: 42 % (ref 36.0–46.0)
Hemoglobin: 13.5 g/dL (ref 12.0–15.0)
Immature Granulocytes: 1 %
Lymphocytes Relative: 28 %
Lymphs Abs: 4 10*3/uL (ref 0.7–4.0)
MCH: 29.2 pg (ref 26.0–34.0)
MCHC: 32.1 g/dL (ref 30.0–36.0)
MCV: 90.9 fL (ref 80.0–100.0)
Monocytes Absolute: 1.6 10*3/uL — ABNORMAL HIGH (ref 0.1–1.0)
Monocytes Relative: 11 %
Neutro Abs: 8.1 10*3/uL — ABNORMAL HIGH (ref 1.7–7.7)
Neutrophils Relative %: 58 %
Platelets: 203 10*3/uL (ref 150–400)
RBC: 4.62 MIL/uL (ref 3.87–5.11)
RDW: 13.4 % (ref 11.5–15.5)
WBC: 14.2 10*3/uL — ABNORMAL HIGH (ref 4.0–10.5)
nRBC: 0 % (ref 0.0–0.2)

## 2021-10-12 LAB — URINALYSIS, ROUTINE W REFLEX MICROSCOPIC
Glucose, UA: NEGATIVE mg/dL
Ketones, ur: NEGATIVE mg/dL
Nitrite: NEGATIVE
Protein, ur: 30 mg/dL — AB
Specific Gravity, Urine: 1.021 (ref 1.005–1.030)
WBC, UA: >50 WBC/hpf — ABNORMAL HIGH (ref 0–5)
pH: 5 (ref 5.0–8.0)

## 2021-10-12 LAB — COMPREHENSIVE METABOLIC PANEL
ALT: 11 U/L (ref 0–44)
AST: 17 U/L (ref 15–41)
Albumin: 3.7 g/dL (ref 3.5–5.0)
Alkaline Phosphatase: 56 U/L (ref 38–126)
Anion gap: 10 (ref 5–15)
BUN: 18 mg/dL (ref 6–20)
CO2: 27 mmol/L (ref 22–32)
Calcium: 9.3 mg/dL (ref 8.9–10.3)
Chloride: 106 mmol/L (ref 98–111)
Creatinine, Ser: 1.1 mg/dL — ABNORMAL HIGH (ref 0.44–1.00)
GFR, Estimated: 59 mL/min — ABNORMAL LOW (ref >60)
Glucose, Bld: 115 mg/dL — ABNORMAL HIGH (ref 70–99)
Potassium: 3.2 mmol/L — ABNORMAL LOW (ref 3.5–5.1)
Sodium: 143 mmol/L (ref 135–145)
Total Bilirubin: 0.6 mg/dL (ref 0.3–1.2)
Total Protein: 7 g/dL (ref 6.5–8.1)

## 2021-10-12 LAB — I-STAT BETA HCG BLOOD, ED (MC, WL, AP ONLY): I-stat hCG, quantitative: <5 m[IU]/mL (ref <5)

## 2021-10-12 LAB — LIPASE, BLOOD: Lipase: 39 U/L (ref 11–51)

## 2021-10-12 NOTE — ED Triage Notes (Signed)
Pt ate milkshake earlier today and threw up. Pt having 10/10 RUQ and LUQ stomach pain. Pt was told she has gallstones a month ago. Pt feels like she cant breathe from the pain. ?

## 2021-10-13 MED ORDER — POTASSIUM CHLORIDE CRYS ER 20 MEQ PO TBCR
40.0000 meq | EXTENDED_RELEASE_TABLET | Freq: Once | ORAL | Status: AC
  Administered 2021-10-13: 40 meq via ORAL
  Filled 2021-10-13: qty 2

## 2021-10-13 MED ORDER — POTASSIUM CHLORIDE CRYS ER 20 MEQ PO TBCR: 20.0000 meq | EXTENDED_RELEASE_TABLET | Freq: Every day | ORAL | 0 refills | Status: DC

## 2021-10-13 NOTE — ED Notes (Signed)
The pt has known gallstones since yesterday  the pt has pain in her epigastriac region ?

## 2021-10-13 NOTE — ED Notes (Signed)
Pt not eating very nych food ?

## 2021-10-13 NOTE — ED Provider Notes (Signed)
?[facility] EMERGENCY DEPARTMENT ?Provider Note ? ? ?CSN: [id number] ?Arrival date & time: 10/12/21  2210 ? ?  ? ?History ? ?Chief Complaint  ?Patient presents with  ? Shortness of Breath  ? ? ?Sharon Dyer is a 56 y.o. female. ? ?The history is provided by the patient.  ?Shortness of Breath ?Her chief complaint is actually abdominal pain with nausea and vomiting.  He has history of hypertension and was diagnosed with gallstones 2 months ago.  Earlier today, she drank a milkshake and about 2 hours later, developed pain across her upper abdomen with associated nausea and vomiting.  There is no radiation of pain.  Pain was severe.  She came to the emergency department but states that pain resolved within about an hour of getting care.  She is currently pain-free.  Of note, she has not made an appointment to see a surgeon. ?  ?Home Medications ?Prior to Admission medications   ?Medication Sig Start Date End Date Taking? Authorizing Provider  ?potassium chloride SA (KLOR-CON M) 20 MEQ tablet Take 1 tablet (20 mEq total) by mouth daily. 10/13/21  Yes Dione Booze, MD  ?acetaminophen (TYLENOL) 500 MG tablet Take 500 mg by mouth every 6 (six) hours as needed for moderate pain.    [provider]  ?amLODipine (NORVASC) 10 MG tablet TAKE ONE TABLET BY MOUTH DAILY 10/11/21   Grayce Sessions, NP  ?losartan-hydrochlorothiazide (HYZAAR) 100-25 MG tablet Take 1 tablet by mouth daily. 08/29/21   Grayce Sessions, NP  ?   ? ?Allergies    ?Patient has no known allergies.   ? ?Review of Systems   ?Review of Systems  ?Respiratory:  Positive for shortness of breath.   ?All other systems reviewed and are negative. ? ?Physical Exam ?Updated Vital Signs ?BP 116/63 (BP Location: Right Arm)   Pulse (!) 57   Temp 98 ?F (36.7 ?C) (Oral)   Resp 17   Ht 5\' 8"  (1.727 m)   Wt 127 kg   LMP 01/12/2015   SpO2 95%   BMI 42.57 kg/m?  ?Physical Exam ?Vitals and nursing note reviewed.  ?56 year old female, resting  comfortably and in no acute distress. Vital signs are significant for slightly slow heart rate. Oxygen saturation is 95%, which is normal. ?Head is normocephalic and atraumatic. PERRLA, EOMI. Oropharynx is clear. ?Neck is nontender and supple without adenopathy or JVD. ?Back is nontender and there is no CVA tenderness. ?Lungs are clear without rales, wheezes, or rhonchi. ?Chest is nontender. ?Heart has regular rate and rhythm without murmur. ?Abdomen is soft, flat, with mild tenderness in the left upper quadrant and epigastric area mild to moderate tenderness in the right upper quadrant.  There are no masses or hepatosplenomegaly and peristalsis is normoactive. ?Extremities have no cyanosis or edema, full range of motion is present. ?Skin is warm and dry without rash. ?Neurologic: Mental status is normal, cranial nerves are intact, moves all extremities equally. ? ?ED Results / Procedures / Treatments   ?Labs ?(all labs ordered are listed, but only abnormal results are displayed) ?Labs Reviewed  ?COMPREHENSIVE METABOLIC PANEL - Abnormal; Notable for the following components:  ?    Result Value  ? Potassium 3.2 (*)   ? Glucose, Bld 115 (*)   ? Creatinine, Ser 1.10 (*)   ? GFR, Estimated 59 (*)   ? All other components within normal limits  ?CBC WITH DIFFERENTIAL/PLATELET - Abnormal; Notable for the following components:  ? WBC 14.2 (*)   ?  Neutro Abs 8.1 (*)   ? Monocytes Absolute 1.6 (*)   ? Abs Immature Granulocytes 0.11 (*)   ? All other components within normal limits  ?URINALYSIS, ROUTINE W REFLEX MICROSCOPIC - Abnormal; Notable for the following components:  ? Color, Urine AMBER (*)   ? APPearance CLOUDY (*)   ? Hgb urine dipstick SMALL (*)   ? Bilirubin Urine SMALL (*)   ? Protein, ur 30 (*)   ? Leukocytes,Ua LARGE (*)   ? WBC, UA >50 (*)   ? Bacteria, UA FEW (*)   ? All other components within normal limits  ?LIPASE, BLOOD  ?I-STAT BETA HCG BLOOD, ED ([facility], [facility], [facility] ONLY)  ? ?Procedures ?Procedures   ? ? ?Medications Ordered in ED ?Medications  ?potassium chloride SA (KLOR-CON M) CR tablet 40 mEq (has no administration in time range)  ? ? ?ED Course/ Medical Decision Making/ A&P ?  ?                        ?Medical Decision Making ?Amount and/or Complexity of Data Reviewed ?Labs: ordered. ? ?Risk ?Prescription drug management. ? ? ?Biliary colic which has resolved.  Old records are reviewed showing ED visit on 08/11/2021 at which time ultrasound showed cholelithiasis.  Labs today showed normal transaminases and bilirubin and lipase, normal WBC.  She is noted to have mild hypokalemia which was present in March as well.  This is likely secondary to hydrochlorothiazide which she takes for her blood pressure.  She is given a dose of K-Dur and discharged with prescription for same.  She is referred to general surgery for evaluation for elective cholecystectomy.  Patient advised to stay on a low fat diet. ? ?Final Clinical Impression(s) / ED Diagnoses ?Final diagnoses:  ?Biliary colic  ?Diuretic-induced hypokalemia  ? ? ?Rx / DC Orders ?ED Discharge Orders   ? ?      Ordered  ?  potassium chloride SA (KLOR-CON M) 20 MEQ tablet  Daily       ? 10/13/21 0333  ? ?  ?  ? ?  ? ? ?  ?Dione Booze, MD ?10/13/21 [phone] ? ?

## 2021-10-18 ENCOUNTER — Ambulatory Visit
Admission: RE | Admit: 2021-10-18 | Discharge: 2021-10-18 | Disposition: A | Discharge status: Home / Self Care | Payer: No Typology Code available for payment source | Source: Ambulatory Visit | Attending: Primary Care | Admitting: Primary Care

## 2021-10-18 DIAGNOSIS — Z1231 Encounter for screening mammogram for malignant neoplasm of breast: Secondary | ICD-10-CM

## 2022-02-05 ENCOUNTER — Encounter (INDEPENDENT_AMBULATORY_CARE_PROVIDER_SITE_OTHER): Payer: Self-pay | Admitting: Primary Care

## 2022-02-05 ENCOUNTER — Ambulatory Visit (INDEPENDENT_AMBULATORY_CARE_PROVIDER_SITE_OTHER): Payer: Self-pay | Admitting: Primary Care

## 2022-02-05 VITALS — BP 121/66 | HR 68 | Temp 98.0°F | Ht 68.0 in | Wt 275.0 lb

## 2022-02-05 DIAGNOSIS — F332 Major depressive disorder, recurrent severe without psychotic features: Secondary | ICD-10-CM

## 2022-02-05 DIAGNOSIS — F17210 Nicotine dependence, cigarettes, uncomplicated: Secondary | ICD-10-CM

## 2022-02-05 DIAGNOSIS — R7303 Prediabetes: Secondary | ICD-10-CM

## 2022-02-05 DIAGNOSIS — Z6841 Body Mass Index (BMI) 40.0 and over, adult: Secondary | ICD-10-CM

## 2022-02-05 DIAGNOSIS — M545 Low back pain, unspecified: Secondary | ICD-10-CM

## 2022-02-05 DIAGNOSIS — Z720 Tobacco use: Secondary | ICD-10-CM

## 2022-02-05 DIAGNOSIS — Z131 Encounter for screening for diabetes mellitus: Secondary | ICD-10-CM

## 2022-02-05 DIAGNOSIS — R82998 Other abnormal findings in urine: Secondary | ICD-10-CM

## 2022-02-05 LAB — POCT URINALYSIS DIP (CLINITEK)
Bilirubin, UA: NEGATIVE
Glucose, UA: NEGATIVE mg/dL
Ketones, POC UA: NEGATIVE mg/dL
Nitrite, UA: NEGATIVE
POC PROTEIN,UA: NEGATIVE
Spec Grav, UA: 1.02 (ref 1.010–1.025)
Urobilinogen, UA: 1 E.U./dL
pH, UA: 5.5 (ref 5.0–8.0)

## 2022-02-05 LAB — POCT GLYCOSYLATED HEMOGLOBIN (HGB A1C): Hemoglobin A1C: 5.9 % — AB (ref 4.0–5.6)

## 2022-02-05 NOTE — Progress Notes (Signed)
     [facility]  Subjective:   Sharon Dyer is an 56 y.o. female who presents for evaluation and treatment of depressive symptoms and back and knee pain. Review of Systems Pertinent items are noted in HPI.   Objective:   Mental Status Examination: Posture and motor behavior: Appropriate Dress, grooming, personal hygiene: Appropriate Facial expression: Negative Speech: Appropriate Mood: Negative Coherency and relevance of thought: Appropriate Thought content: Appropriate Perceptions: Appropriate Orientation:Appropriate Attention and concentration: Appropriate Memory: : Appropriate   Sharon Dyer was seen today for back pain, knee pain and depression.  Diagnoses and all orders for this visit:  Screening for diabetes mellitus -     HgB A1c 5.9  Morbid obesity (HCC) Morbid Obesity is BMI > 40  indicating an excess in caloric intake or underlining conditions. Lead to co-morbidities. Lifestyle modifications of diet and exercise may reduce obesity.    Tobacco abuse - I have recommended complete cessation of tobacco use. I have discussed various options available for assistance with tobacco cessation including over the counter methods (Nicotine gum, patch and lozenges). We also discussed prescription options (Chantix, Nicotine Inhaler / Nasal Spray). The patient is not interested in pursuing any prescription tobacco cessation options at this time. - Patient declines at this time.   Bilateral low back pain, unspecified chronicity, unspecified whether sciatica present Aggravating factors: certain movements and prolonged walking/standing. Alleviating factors: rest. Tingling sensation changes and weakness down her legs. Ambulatory  difficulty. Normal bowel/bladder habits: yes; without urinary retention. Normal PO intake without n/v. No associated abdominal pain/n/v. Self treatment: has OTC analgesics, with minimal relief  POCT URINALYSIS DIP (CLINITEK)   Prediabetes   5.7-6.4 will start with life style modification monitoring foods that are high in carbohydrates are the following rice, potatoes, breads, sugars, and pastas.  Reduction in the intake (eating) will assist in lowering your blood sugars.    Depressive Disorder:Depression  Onset approximately 6 month ago, gradually worsening since that time.  Current symptoms include depressed mood, insomnia, fatigue, feelings of worthlessness/guilt, recurrent thoughts of death, suicidal thoughts without plan,( tried when she was a 14 teenager-took pills took to hospital pump her stomach) anxiety, loss of energy/fatigue, disturbed sleep, decreased appetite,.  Current treatment for depression:None Sleep problems: Moderate   Early awakening:Moderate   Energy: Poor Motivation: Good Concentration: Poor trying to read the bible can't concentrate on that. Rumination/worrying: Moderate Memory: Good Tearfulness: Severe  Anxiety: Moderate  Panic: Mild  Hopelessness: Moderate Suicidal ideation: Mild  Other/Psychosocial Stressors: yes  Family history positive for depression in the patient's unknown.  Previous treatment modalities employed include None.  Past episodes of depression:none Organic causes of depression present: cigarettes .  Suicide Risk Assessment: Suicidal intent: no Suicidal plan: no Access to means for suicide: yes -no guns  Lethality of means for suicide: no Prior suicide attempts: yes Recent exposure to suicide:no -       Leukocytes in urine -     CULTURE, URINE COMPREHENSIVE    This note has been created with Education officer, environmental. Any transcriptional errors are unintentional.   Sharon Sessions, NP 02/05/2022, 3:36 PM

## 2022-02-05 NOTE — Patient Instructions (Signed)

## 2022-02-08 LAB — CULTURE, URINE COMPREHENSIVE

## 2022-02-12 ENCOUNTER — Ambulatory Visit (INDEPENDENT_AMBULATORY_CARE_PROVIDER_SITE_OTHER): Payer: No Typology Code available for payment source | Admitting: Primary Care

## 2022-02-12 ENCOUNTER — Encounter (INDEPENDENT_AMBULATORY_CARE_PROVIDER_SITE_OTHER): Payer: Self-pay | Admitting: Primary Care

## 2022-02-12 ENCOUNTER — Ambulatory Visit (INDEPENDENT_AMBULATORY_CARE_PROVIDER_SITE_OTHER): Payer: Self-pay | Admitting: *Deleted

## 2022-02-12 VITALS — BP 112/61 | HR 64 | Temp 98.3°F | Resp 19 | Ht 68.0 in | Wt 275.0 lb

## 2022-02-12 DIAGNOSIS — M545 Low back pain, unspecified: Secondary | ICD-10-CM

## 2022-02-12 DIAGNOSIS — Z1211 Encounter for screening for malignant neoplasm of colon: Secondary | ICD-10-CM

## 2022-02-12 MED ORDER — IBUPROFEN 800 MG PO TABS: 800.0000 mg | ORAL_TABLET | Freq: Three times a day (TID) | ORAL | 1 refills | Status: DC | PRN

## 2022-02-12 NOTE — Progress Notes (Signed)
[facility]  Subjective: CC: back pain PCP: Grayce Sessions, NP HPI: Patient is a 56 y.o. female presenting to clinic today for back pain. Concerns today include:  1. Back Pain Patient reports that pain began 6 weeks.  No h/o back pain.  Pain is a 0/10. ((Ranges 6-7/10) It radiate down her left leg especially when lying down.  Walking, getting in and out of her car, sitting for long periods of time worsens pain.  Heating pad improves pain.  Patient has been taking Aleve  for pain with little relief.  Patient denies trauma or injury.  Denies dysuria, hematuria, fevers, chills, nausea, vomiting, abdominal pain, renal stones. Aggravating factors: certain movements and prolonged walking/standing. Alleviating factors: rest. Progressive LE weakness bilateral.. Extremity sensation changes or weakness: none. Ambulatory without difficulty. Normal bowel/bladder habits: yes; without urinary retention. Normal PO intake without n/v. No associated abdominal pain/n/v. Self treatment: has OTC analgesics, with minimal relief. Patient denies: urinary retention/incontinence, bowel incontinence, weakness, falls, sensation changes or pain anywhere else. No h/o back surgeries.    Current Outpatient Medications:    amLODipine (NORVASC) 10 MG tablet, TAKE ONE TABLET BY MOUTH DAILY, Disp: 90 tablet, Rfl: 1   losartan-hydrochlorothiazide (HYZAAR) 100-25 MG tablet, Take 1 tablet by mouth daily., Disp: 90 tablet, Rfl: 1   naproxen sodium (ALEVE) 220 MG tablet, Take 220 mg by mouth daily as needed (approximately 4 pills per day)., Disp: , Rfl:    acetaminophen (TYLENOL) 500 MG tablet, Take 500 mg by mouth every 6 (six) hours as needed for moderate pain. (Patient not taking: Reported on 02/12/2022), Disp: , Rfl:    potassium chloride SA (KLOR-CON M) 20 MEQ tablet, Take 1 tablet (20 mEq total) by mouth daily. (Patient not taking: Reported on 02/12/2022), Disp: 30 tablet, Rfl: 0 No Known Allergies  Past  Medical History:  Diagnosis Date   Anxiety    Arthritis    Patient verbalizes she has athritis to right ankle   Dyspnea    Frozen shoulder syndrome 07/30/2019   Hypertension    Obesity    Social History   Socioeconomic History   Marital status: Divorced    Spouse name: Not on file   Number of children: Not on file   Years of education: Not on file   Highest education level: Not on file  Occupational History   Not on file  Tobacco Use   Smoking status: Every Day    Packs/day: 1.00    Years: 37.00    Total pack years: 37.00    Types: Cigarettes   Smokeless tobacco: Never  Vaping Use   Vaping Use: Never used  Substance and Sexual Activity   Alcohol use: Yes    Comment: 12/22/2017 "maybe twice a year"   Drug use: Never   Sexual activity: Yes    Birth control/protection: None  Other Topics Concern   Not on file  Social History Narrative   Not on file   Social Determinants of Health   Financial Resource Strain: Not on file  Food Insecurity: Not on file  Transportation Needs: Not on file  Physical Activity: Not on file  Stress: Not on file  Social Connections: Not on file  Intimate Partner Violence: Not on file   Past Surgical History:  Procedure Laterality Date   ECTOPIC PREGNANCY SURGERY  X 2    ROS: per HPI  Objective: Office vital signs reviewed. BP 112/61 (BP Location: Left Arm, Patient Position: Sitting, Cuff Size: Large)  Pulse 64   Temp 98.3 F (36.8 C)   Resp 19   Ht 5\' 8"  (1.727 m)   Wt 275 lb (124.7 kg)   LMP 01/12/2015   SpO2 98%   BMI 41.81 kg/m   Physical Examination:  General: Acute  apparent distress. Eyes: Extraocular eye movements intact, pupils equal and round. Neck: Supple, trachea midline. Thyroid: No enlargement, mobile without fixation, no tenderness. Cardiovascular: Regular rhythm and rate, no murmur, normal radial pulses. Respiratory: Normal respiratory effort, clear to auscultation. Gastrointestinal: Normal pitch active  bowel sounds, nontender abdomen without distention or appreciable hepatomegaly. Musculoskeletal: Normal muscle tone, L3-S1 pain with palpitation  Skin: Appropriate warmth, no visible rash. Mental status: Alert, conversant, speech clear, thought logical, appropriate mood and affect, no hallucinations or delusions evident. Hematologic/lymphatic: No cervical adenopathy, no visible ecchymoses.   Assessment/ Plan: Diagnoses and all orders for this visit:  Bilateral low back pain, unspecified chronicity, unspecified whether sciatica present -     ibuprofen (ADVIL) 800 MG tablet; Take 1 tablet (800 mg total) by mouth every 8 (eight) hours as needed. -     Ambulatory referral to Orthopedic Surgery The above assessment and management plan was discussed with the patient. The patient verbalized understanding of and has agreed to the management plan. Patient is aware to call the clinic if symptoms persist or worsen. Patient is aware when to return to the clinic for a follow-up visit. Patient educated on when it is appropriate to go to the emergency department.   Colon cancer screening -     Cologuard    This note has been created with 03/14/2015. Any transcriptional errors are unintentional.   Education officer, environmental, NP 02/12/2022, 2:30 PM

## 2022-02-12 NOTE — Telephone Encounter (Signed)
  Chief Complaint: back pain Symptoms: lower back pain- not better- unable to work Frequency: over 1 month Pertinent Negatives: Patient denies fever, abdomen pain, burning with urination, blood in urine Disposition: [] ED /[] Urgent Care (no appt availability in office) / [x] Appointment(In office/virtual)/ []  [facility]/ [] Home Care/ [] Refused Recommended Disposition /[] [place] [facility]/ []  Follow-up with PCP Additional Notes:

## 2022-02-12 NOTE — Patient Instructions (Signed)
Acute Back Pain, Adult Acute back pain is sudden and usually short-lived. It is often caused by an injury to the muscles and tissues in the back. The injury may result from: A muscle, tendon, or ligament getting overstretched or torn. Ligaments are tissues that connect bones to each other. Lifting something improperly can cause a back strain. Wear and tear (degeneration) of the spinal disks. Spinal disks are circular tissue that provide cushioning between the bones of the spine (vertebrae). Twisting motions, such as while playing sports or doing yard work. A hit to the back. Arthritis. You may have a physical exam, lab tests, and imaging tests to find the cause of your pain. Acute back pain usually goes away with rest and home care. Follow these instructions at home: Managing pain, stiffness, and swelling Take over-the-counter and prescription medicines only as told by your health care provider. Treatment may include medicines for pain and inflammation that are taken by mouth or applied to the skin, or muscle relaxants. Your health care provider may recommend applying ice during the first 24-48 hours after your pain starts. To do this: Put ice in a plastic bag. Place a towel between your skin and the bag. Leave the ice on for 20 minutes, 2-3 times a day. Remove the ice if your skin turns bright red. This is very important. If you cannot feel pain, heat, or cold, you have a greater risk of damage to the area. If directed, apply heat to the affected area as often as told by your health care provider. Use the heat source that your health care provider recommends, such as a moist heat pack or a heating pad. Place a towel between your skin and the heat source. Leave the heat on for 20-30 minutes. Remove the heat if your skin turns bright red. This is especially important if you are unable to feel pain, heat, or cold. You have a greater risk of getting burned. Activity  Do not stay in bed. Staying in  bed for more than 1-2 days can delay your recovery. Sit up and stand up straight. Avoid leaning forward when you sit or hunching over when you stand. If you work at a desk, sit close to it so you do not need to lean over. Keep your chin tucked in. Keep your neck drawn back, and keep your elbows bent at a 90-degree angle (right angle). Sit high and close to the steering wheel when you drive. Add lower back (lumbar) support to your car seat, if needed. Take short walks on even surfaces as soon as you are able. Try to increase the length of time you walk each day. Do not sit, drive, or stand in one place for more than 30 minutes at a time. Sitting or standing for long periods of time can put stress on your back. Do not drive or use heavy machinery while taking prescription pain medicine. Use proper lifting techniques. When you bend and lift, use positions that put less stress on your back: Bend your knees. Keep the load close to your body. Avoid twisting. Exercise regularly as told by your health care provider. Exercising helps your back heal faster and helps prevent back injuries by keeping muscles strong and flexible. Work with a physical therapist to make a safe exercise program, as recommended by your health care provider. Do any exercises as told by your physical therapist. Lifestyle Maintain a healthy weight. Extra weight puts stress on your back and makes it difficult to have good   posture. Avoid activities or situations that make you feel anxious or stressed. Stress and anxiety increase muscle tension and can make back pain worse. Learn ways to manage anxiety and stress, such as through exercise. General instructions Sleep on a firm mattress in a comfortable position. Try lying on your side with your knees slightly bent. If you lie on your back, put a pillow under your knees. Keep your head and neck in a straight line with your spine (neutral position) when using electronic equipment like  smartphones or pads. To do this: Raise your smartphone or pad to look at it instead of bending your head or neck to look down. Put the smartphone or pad at the level of your face while looking at the screen. Follow your treatment plan as told by your health care provider. This may include: Cognitive or behavioral therapy. Acupuncture or massage therapy. Meditation or yoga. Contact a health care provider if: You have pain that is not relieved with rest or medicine. You have increasing pain going down into your legs or buttocks. Your pain does not improve after 2 weeks. You have pain at night. You lose weight without trying. You have a fever or chills. You develop nausea or vomiting. You develop abdominal pain. Get help right away if: You develop new bowel or bladder control problems. You have unusual weakness or numbness in your arms or legs. You feel faint. These symptoms may represent a serious problem that is an emergency. Do not wait to see if the symptoms will go away. Get medical help right away. Call your local emergency services (911 in the [place] Do not drive yourself to the hospital. Summary Acute back pain is sudden and usually short-lived. Use proper lifting techniques. When you bend and lift, use positions that put less stress on your back. Take over-the-counter and prescription medicines only as told by your health care provider, and apply heat or ice as told. This information is not intended to replace advice given to you by your health care provider. Make sure you discuss any questions you have with your health care provider. Document Revised: 08/18/2020 Document Reviewed: 08/18/2020 Elsevier Patient Education  2023 Elsevier Inc. You make take 1 ibuprofen and 1 500 mg of tylenol . You make take another Tylenol 500 mg in for hour.

## 2022-02-12 NOTE — Telephone Encounter (Signed)
Summary: lower back discomfort / rx req   The patient has been previously seen for their lower back discomfort but shares that it continues to cause them discomfort   The patient has experienced the discomfort for more than a month   The patient would like to be prescribed something for their discomfort   Please contact the patient further when possible      Reason for Disposition  [1] Pain radiates into the thigh or further down the leg AND [2] both legs  Answer Assessment - Initial Assessment Questions 1. ONSET: "When did the pain begin?"      Over 1 month 2. LOCATION: "Where does it hurt?" (upper, mid or lower back)     Midline- lower pain 3. SEVERITY: "How bad is the pain?"  (e.g., Scale 1-10; mild, moderate, or severe)   - MILD (1-3): Doesn't interfere with normal activities.    - MODERATE (4-7): Interferes with normal activities or awakens from sleep.    - SEVERE (8-10): Excruciating pain, unable to do any normal activities.      Moderate 4. PATTERN: "Is the pain constant?" (e.g., yes, no; constant, intermittent)      comes and goes- mostly with activity 5. RADIATION: "Does the pain shoot into your legs or somewhere else?"     Into legs( left worse) and has numbness in feet 6. CAUSE:  "What do you think is causing the back pain?"      unknown 7. BACK OVERUSE:  "Any recent lifting of heavy objects, strenuous work or exercise?"     no 8. MEDICINES: "What have you taken so far for the pain?" (e.g., nothing, acetaminophen, NSAIDS)     aleve 9. NEUROLOGIC SYMPTOMS: "Do you have any weakness, numbness, or problems with bowel/bladder control?"     Numbness in feet- chronic 10. OTHER SYMPTOMS: "Do you have any other symptoms?" (e.g., fever, abdomen pain, burning with urination, blood in urine)      Stomach discomfort- doesn't want to move 11. PREGNANCY: "Is there any chance you are pregnant?" "When was your last menstrual period?"  Protocols used: Back Pain-A-AH

## 2022-02-15 ENCOUNTER — Ambulatory Visit: Payer: No Typology Code available for payment source | Admitting: Physician Assistant

## 2022-02-21 ENCOUNTER — Ambulatory Visit: Payer: No Typology Code available for payment source | Admitting: Physician Assistant

## 2022-03-01 LAB — COLOGUARD: COLOGUARD: NEGATIVE

## 2022-03-05 ENCOUNTER — Other Ambulatory Visit: Payer: Self-pay

## 2022-03-05 ENCOUNTER — Encounter (INDEPENDENT_AMBULATORY_CARE_PROVIDER_SITE_OTHER): Payer: Self-pay | Admitting: Primary Care

## 2022-03-05 ENCOUNTER — Ambulatory Visit (INDEPENDENT_AMBULATORY_CARE_PROVIDER_SITE_OTHER): Payer: Self-pay | Admitting: Primary Care

## 2022-03-05 VITALS — BP 118/74 | HR 64 | Resp 16 | Wt 284.6 lb

## 2022-03-05 DIAGNOSIS — F331 Major depressive disorder, recurrent, moderate: Secondary | ICD-10-CM

## 2022-03-05 MED ORDER — BUPROPION HCL ER (SR) 150 MG PO TB12: 150.0000 mg | ORAL_TABLET | Freq: Two times a day (BID) | ORAL | 1 refills | Status: DC

## 2022-03-05 NOTE — Patient Instructions (Signed)
Bupropion Extended-Release Tablets (Depression/Mood Disorders) What is this medication? BUPROPION (byoo PROE pee on) treats depression. It increases norepinephrine and dopamine in the brain, hormones that help regulate mood. It belongs to a group of medications called NDRIs. This medicine may be used for other purposes; ask your health care provider or pharmacist if you have questions. COMMON BRAND NAME(S): Aplenzin, Budeprion XL, Forfivo XL, Wellbutrin XL What should I tell my care team before I take this medication? They need to know if you have any of these conditions: An eating disorder, such as anorexia or bulimia Bipolar disorder or psychosis Diabetes or high blood sugar, treated with medication Glaucoma Head injury or brain tumor Heart disease, previous heart attack, or irregular heart beat High blood pressure Kidney or liver disease Seizures (convulsions) Suicidal thoughts or a previous suicide attempt Tourette's syndrome Weight loss An unusual or allergic reaction to bupropion, other medications, foods, dyes, or preservatives Pregnant or trying to become pregnant Breast-feeding How should I use this medication? Take this medication by mouth with a glass of water. Follow the directions on the prescription label. You can take it with or without food. If it upsets your stomach, take it with food. Do not crush, chew, or cut these tablets. This medication is taken once daily at the same time each day. Do not take your medication more often than directed. Do not stop taking this medication suddenly except upon the advice of your care team. Stopping this medication too quickly may cause serious side effects or your condition may worsen. A special MedGuide will be given to you by the pharmacist with each prescription and refill. Be sure to read this information carefully each time. Talk to your care team about the use of this medication in children. Special care may be needed. Overdosage:  If you think you have taken too much of this medicine contact a poison control center or emergency room at once. NOTE: This medicine is only for you. Do not share this medicine with others. What if I miss a dose? If you miss a dose, skip the missed dose and take your next tablet at the regular time. Do not take double or extra doses. What may interact with this medication? Do not take this medication with any of the following: Linezolid MAOIs like Azilect, Carbex, Eldepryl, Marplan, Nardil, and Parnate Methylene blue (injected into a vein) Other medications that contain bupropion like Zyban This medication may also interact with the following: Alcohol Certain medications for anxiety or sleep Certain medications for blood pressure like metoprolol, propranolol Certain medications for depression or psychotic disturbances Certain medications for HIV or AIDS like efavirenz, lopinavir, nelfinavir, ritonavir Certain medications for irregular heart beat like propafenone, flecainide Certain medications for Parkinson's disease like amantadine, levodopa Certain medications for seizures like carbamazepine, phenytoin, phenobarbital Cimetidine Clopidogrel Cyclophosphamide Digoxin Furazolidone Isoniazid Nicotine Orphenadrine Procarbazine Steroid medications like prednisone or cortisone Stimulant medications for attention disorders, weight loss, or to stay awake Tamoxifen Theophylline Thiotepa Ticlopidine Tramadol Warfarin This list may not describe all possible interactions. Give your health care provider a list of all the medicines, herbs, non-prescription drugs, or dietary supplements you use. Also tell them if you smoke, drink alcohol, or use illegal drugs. Some items may interact with your medicine. What should I watch for while using this medication? Tell your care team if your symptoms do not get better or if they get worse. Visit your care team for regular checks on your progress.  Because it may take several weeks   to see the full effects of this medication, it is important to continue your treatment as prescribed. Watch for new or worsening thoughts of suicide or depression. This includes sudden changes in mood, behavior, or thoughts. These changes can happen at any time but are more common in the beginning of treatment or after a change in dose. Call your care team right away if you experience these thoughts or worsening depression. Manic episodes may happen in patients with bipolar disorder who take this medication. Watch for changes in feelings or behaviors such as feeling anxious, nervous, agitated, panicky, irritable, hostile, aggressive, impulsive, severely restless, overly excited and hyperactive, or trouble sleeping. These symptoms can happen at anytime but are more common in the beginning of treatment or after a change in dose. Call your care team right away if you notice any of these symptoms. This medication may cause serious skin reactions. They can happen weeks to months after starting the medication. Contact your care team right away if you notice fevers or flu-like symptoms with a rash. The rash may be red or purple and then turn into blisters or peeling of the skin. Or, you might notice a red rash with swelling of the face, lips or lymph nodes in your neck or under your arms. Avoid drinks that contain alcohol while taking this medication. Drinking large amounts of alcohol, using sleeping or anxiety medications, or quickly stopping the use of these agents while taking this medication may increase your risk for a seizure. Do not drive or use heavy machinery until you know how this medication affects you. This medication can impair your ability to perform these tasks. Do not take this medication close to bedtime. It may prevent you from sleeping. Your mouth may get dry. Chewing sugarless gum or sucking hard candy, and drinking plenty of water may help. Contact your care  team if the problem does not go away or is severe. The tablet shell for some brands of this medication does not dissolve. This is normal. The tablet shell may appear whole in the stool. This is not a cause for concern. What side effects may I notice from receiving this medication? Side effects that you should report to your care team as soon as possible: Allergic reactions--skin rash, itching, hives, swelling of the face, lips, tongue, or throat Increase in blood pressure Mood and behavior changes--anxiety, nervousness, confusion, hallucinations, irritability, hostility, thoughts of suicide or self-harm, worsening mood, feelings of depression Redness, blistering, peeling, or loosening of the skin, including inside the mouth Seizures Sudden eye pain or change in vision such as blurry vision, seeing halos around lights, vision loss Side effects that usually do not require medical attention (report to your care team if they continue or are bothersome): Constipation Dizziness Dry mouth Loss of appetite Nausea Tremors or shaking Trouble sleeping This list may not describe all possible side effects. Call your doctor for medical advice about side effects. You may report side effects to FDA at 1-800-FDA-1088. Where should I keep my medication? Keep out of the reach of children and pets. Store at room temperature between 15 and 30 degrees C (59 and 86 degrees F). Throw away any unused medication after the expiration date. NOTE: This sheet is a summary. It may not cover all possible information. If you have questions about this medicine, talk to your doctor, pharmacist, or health care provider.  2023 Elsevier/Gold Standard (2004-06-27 00:00:00)  

## 2022-03-05 NOTE — Progress Notes (Signed)
Sharon Dyer, is a 56 y.o. female  YQM:[id number]  BMW:[id number]  DOB - 1965/06/18  Chief Complaint  Patient presents with   Depression       Subjective:   Sharon Dyer is a 56 y.o. female here today for depression and anxiety. Overwhelm with life stressor income, paying bills denies harm to self or others, no auditory or visual hallucination.  Patient has No headache, No chest pain, No abdominal pain - No Nausea, No new weakness tingling or numbness, No Cough - shortness of breath  No problems updated.  No Known Allergies  Past Medical History:  Diagnosis Date   Anxiety    Arthritis    Patient verbalizes she has athritis to right ankle   Dyspnea    Frozen shoulder syndrome 07/30/2019   Hypertension    Obesity     Current Outpatient Medications on File Prior to Visit  Medication Sig Dispense Refill   acetaminophen (TYLENOL) 500 MG tablet Take 500 mg by mouth every 6 (six) hours as needed for moderate pain. (Patient not taking: Reported on 02/12/2022)     amLODipine (NORVASC) 10 MG tablet TAKE ONE TABLET BY MOUTH DAILY 90 tablet 1   ibuprofen (ADVIL) 800 MG tablet Take 1 tablet (800 mg total) by mouth every 8 (eight) hours as needed. 90 tablet 1   losartan-hydrochlorothiazide (HYZAAR) 100-25 MG tablet Take 1 tablet by mouth daily. 90 tablet 1   potassium chloride SA (KLOR-CON M) 20 MEQ tablet Take 1 tablet (20 mEq total) by mouth daily. (Patient not taking: Reported on 02/12/2022) 30 tablet 0   No current facility-administered medications on file prior to visit.    Objective:   Vitals:   03/05/22 0951  BP: 118/74  Pulse: 64  Resp: 16  SpO2: 100%  Weight: 284 lb 9.6 oz (129.1 kg)    Exam General appearance : Awake, alert, not in any distress. Speech Clear. Not toxic looking HEENT: Atraumatic and Normocephalic, pupils equally reactive to light and accomodation Neck: Supple, no JVD. No cervical lymphadenopathy.  Chest: Good air entry  bilaterally, no added sounds  CVS: S1 S2 regular, no murmurs.  Abdomen: Bowel sounds present, Non tender and not distended with no gaurding, rigidity or rebound. Extremities: B/L Lower Ext shows no edema, both legs are warm to touch Neurology: Awake alert, and oriented X 3,Non focal Skin: No Rash  Data Review Lab Results  Component Value Date   HGBA1C 5.9 (A) 02/05/2022    Assessment & Plan   1. Moderate episode of recurrent major depressive disorder (HCC) Depression can affect thoughts, feelings & sleep. Can lead to changes in mood ,relationships, daily activities & physical health,  - buPROPion (WELLBUTRIN SR) 150 MG 12 hr tablet; Take 1 tablet (150 mg total) by mouth 2 (two) times daily.  Dispense: 180 tablet; Refill: 1   Patient have been counseled extensively about nutrition and exercise. Other issues discussed during this visit include: low cholesterol diet, weight control and daily exercise, foot care, annual eye examinations at Ophthalmology, importance of adherence with medications and regular follow-up. We also discussed long term complications of uncontrolled diabetes and hypertension.   Return in about 6 weeks (around 04/16/2022).  The patient was given clear instructions to go to ER or return to medical center if symptoms don't improve, worsen or new problems develop. The patient verbalized understanding. The patient was told to call to get lab results if they haven't heard anything in the next week.  This note has been created with Education officer, environmental. Any transcriptional errors are unintentional.   Sharon Sessions, NP 03/05/2022, 2:50 PM

## 2022-03-06 ENCOUNTER — Other Ambulatory Visit (INDEPENDENT_AMBULATORY_CARE_PROVIDER_SITE_OTHER): Payer: Self-pay | Admitting: Primary Care

## 2022-03-06 ENCOUNTER — Other Ambulatory Visit: Payer: Self-pay

## 2022-03-06 DIAGNOSIS — F331 Major depressive disorder, recurrent, moderate: Secondary | ICD-10-CM

## 2022-03-06 MED ORDER — BUPROPION HCL ER (SR) 150 MG PO TB12
150.0000 mg | ORAL_TABLET | Freq: Two times a day (BID) | ORAL | 1 refills | Status: DC
  Filled 2022-03-06: qty 180, 90d supply, fill #0

## 2022-03-06 NOTE — Telephone Encounter (Signed)
[facility] pharmacy to check status of medication . Patient has not picked up Rx. Patient called requesting Rx to be sent to [facility].

## 2022-03-06 NOTE — Telephone Encounter (Unsigned)
Copied from [place] [id number]. Topic: General - Other >> Mar 06, 2022  3:14 PM Sharon Dyer wrote: Reason for CRM: Medication Refill - Medication: buPROPion (WELLBUTRIN SR) 150 MG 12 hr tablet [[id number]]   Has the patient contacted their pharmacy? Yes.   (Agent: If no, request that the patient contact the pharmacy for the refill. If patient does not wish to contact the pharmacy document the reason why and proceed with request.) (Agent: If yes, when and what did the pharmacy advise?)  Preferred Pharmacy (with phone number or street name): [facility] [address] [place] [address] Phone: [phone] Fax: [phone] Hours: M-F 7:30a-6:00p   Has the patient been seen for an appointment in the last year OR does the patient have an upcoming appointment? Yes.    Agent: Please be advised that RX refills may take up to 3 business days. We ask that you follow-up with your pharmacy.

## 2022-03-06 NOTE — Telephone Encounter (Signed)
Requested Prescriptions  Pending Prescriptions Disp Refills  . buPROPion (WELLBUTRIN SR) 150 MG 12 hr tablet 180 tablet 1    Sig: Take 1 tablet (150 mg total) by mouth 2 (two) times daily.     Psychiatry: Antidepressants - bupropion Failed - 03/06/2022  3:36 PM      Failed - Cr in normal range and within 360 days    Creatinine, Ser  Date Value Ref Range Status  10/12/2021 1.10 (H) 0.44 - 1.00 mg/dL Final         Passed - AST in normal range and within 360 days    AST  Date Value Ref Range Status  10/12/2021 17 15 - 41 U/L Final         Passed - ALT in normal range and within 360 days    ALT  Date Value Ref Range Status  10/12/2021 11 0 - 44 U/L Final         Passed - Last BP in normal range    BP Readings from Last 1 Encounters:  03/05/22 118/74         Passed - Valid encounter within last 6 months    Recent Outpatient Visits          Yesterday Moderate episode of recurrent major depressive disorder ([name])   [name], Michelle P, NP   3 weeks ago Bilateral low back pain, unspecified chronicity, unspecified whether sciatica present   [name], [place], NP   4 weeks ago Screening for diabetes mellitus   [name], Michelle P, NP   4 months ago Morbid obesity ([name])   [name], Michelle P, NP   6 months ago [name]   [place], [place], NP      Future Appointments            In 1 month Oletta Lamas, [place]ford Cage, NP [name]

## 2022-03-12 ENCOUNTER — Other Ambulatory Visit: Payer: Self-pay

## 2022-04-15 ENCOUNTER — Encounter (INDEPENDENT_AMBULATORY_CARE_PROVIDER_SITE_OTHER): Payer: Self-pay | Admitting: Primary Care

## 2022-04-15 ENCOUNTER — Ambulatory Visit (INDEPENDENT_AMBULATORY_CARE_PROVIDER_SITE_OTHER): Payer: Self-pay | Admitting: Primary Care

## 2022-04-15 VITALS — BP 128/80 | HR 64 | Resp 16 | Wt 281.4 lb

## 2022-04-15 DIAGNOSIS — I10 Essential (primary) hypertension: Secondary | ICD-10-CM

## 2022-04-15 DIAGNOSIS — R7303 Prediabetes: Secondary | ICD-10-CM

## 2022-04-15 DIAGNOSIS — Z720 Tobacco use: Secondary | ICD-10-CM

## 2022-04-15 DIAGNOSIS — R202 Paresthesia of skin: Secondary | ICD-10-CM

## 2022-04-15 DIAGNOSIS — E876 Hypokalemia: Secondary | ICD-10-CM

## 2022-04-15 DIAGNOSIS — F331 Major depressive disorder, recurrent, moderate: Secondary | ICD-10-CM

## 2022-04-15 DIAGNOSIS — E782 Mixed hyperlipidemia: Secondary | ICD-10-CM

## 2022-04-15 DIAGNOSIS — M545 Low back pain, unspecified: Secondary | ICD-10-CM

## 2022-04-15 DIAGNOSIS — Z760 Encounter for issue of repeat prescription: Secondary | ICD-10-CM

## 2022-04-15 MED ORDER — BUPROPION HCL ER (XL) 150 MG PO TB24: 150.0000 mg | ORAL_TABLET | Freq: Every day | ORAL | 1 refills | Status: DC

## 2022-04-15 MED ORDER — GABAPENTIN 100 MG PO CAPS: 100.0000 mg | ORAL_CAPSULE | Freq: Three times a day (TID) | ORAL | 0 refills | Status: DC

## 2022-04-15 MED ORDER — LOSARTAN POTASSIUM-HCTZ 100-25 MG PO TABS: 1.0000 | ORAL_TABLET | Freq: Every day | ORAL | 1 refills | Status: DC

## 2022-04-15 MED ORDER — BUPROPION HCL ER (SR) 150 MG PO TB12: 150.0000 mg | ORAL_TABLET | Freq: Two times a day (BID) | ORAL | 1 refills | Status: DC

## 2022-04-15 MED ORDER — AMLODIPINE BESYLATE 10 MG PO TABS: ORAL_TABLET | ORAL | 1 refills | Status: DC

## 2022-04-15 NOTE — Progress Notes (Signed)
[place], is a 56 y.o. female  OAC:[id number]  WFU:[id number]  DOB - [birth year]  Depression      Subjective:   Sharon Dyer is a 56 y.o. female here today for a follow up visit Depression. Patient has noticed Wellbutrin SR 150 has made her feel better but she only takes it once a day in the evening time she forgets the second dose.  She voices concerns about numbness and tingling in her feet at times it is difficult for her to even walk and also wakes her up at night.  Patient has No headache, No chest pain, No abdominal pain - No Nausea, No new weakness tingling or numbness, No Cough - shortness of breath  No problems updated.  No Known Allergies  Past Medical History:  Diagnosis Date   Anxiety    Arthritis    Patient verbalizes she has athritis to right ankle   Dyspnea    Frozen shoulder syndrome 07/30/2019   Hypertension    Obesity     Current Outpatient Medications on File Prior to Visit  Medication Sig Dispense Refill   acetaminophen (TYLENOL) 500 MG tablet Take 500 mg by mouth every 6 (six) hours as needed for moderate pain.     ibuprofen (ADVIL) 800 MG tablet Take 1 tablet (800 mg total) by mouth every 8 (eight) hours as needed. 90 tablet 1   potassium chloride SA (KLOR-CON M) 20 MEQ tablet Take 1 tablet (20 mEq total) by mouth daily. 30 tablet 0   No current facility-administered medications on file prior to visit.    Objective:   Vitals:   04/15/22 0911  BP: 128/80  Pulse: 64  Resp: 16  SpO2: 98%  Weight: 281 lb 6.4 oz (127.6 kg)    Exam General appearance : Awake, alert, not in any distress. Speech Clear. Not toxic looking HEENT: Atraumatic and Normocephalic, pupils equally reactive to light and accomodation Neck: Supple, no JVD. No cervical lymphadenopathy.  Chest: Good air entry bilaterally, no added sounds  CVS: S1 S2 regular, no murmurs.  Abdomen: Bowel sounds present, Non tender and not distended with no  gaurding, rigidity or rebound. Extremities: B/L Lower Ext shows no edema, both legs are warm to touch Neurology: Awake alert, and oriented X 3, CN II-XII intact, Non focal Skin: No Rash  Data Review Lab Results  Component Value Date   HGBA1C 5.9 (A) 02/05/2022    Assessment & Plan  Zarie was seen today for hypertension.  Diagnoses and all orders for this visit:  Bilateral low back pain, unspecified chronicity, unspecified whether sciatica present BACK PAIN  Location:.  Lumbar 5 to S1 quality: aching, heaviness, pressure, and sharp Onset: worsening Worse with: Bending, lifting, trying to find a comfortable spot in bed and walking upstairs.  Better with: Rest and heating pad radiation: Down to her hip Trauma: No Best sitting/standing/leaning forward: Yes  Red Flags Fecal/urinary incontinence: no  Numbness/Weakness: yes  Fever/chills/sweats: no  Night pain: yes  Unexplained weight loss: no  No relief with bedrest: no  h/o cancer/immunosuppression: no  IV drug use: no  PMH of osteoporosis or chronic steroid use: no    Medication refill -     losartan-hydrochlorothiazide (HYZAAR) 100-25 MG tablet; Take 1 tablet by mouth daily. -     amLODipine (NORVASC) 10 MG tablet; TAKE ONE TABLET BY MOUTH DAILY  Essential hypertension Blood pressure is well controlled less than 130/80 Explained that having normal blood pressure is the goal  and medications are helping to get to goal and maintain normal blood pressure. DIET: Limit salt intake, read nutrition labels to check salt content, limit fried and high fatty foods  Avoid using multisymptom OTC cold preparations that generally contain sudafed which can rise BP. Consult with pharmacist on best cold relief products to use for persons with HTN EXERCISE Discussed incorporating exercise such as walking - 30 minutes most days of the week and can do in 10 minute intervals    -     losartan-hydrochlorothiazide (HYZAAR) 100-25 MG tablet; Take 1  tablet by mouth daily.  Moderate episode of recurrent major depressive disorder [facility]) Patient has noticed Wellbutrin SR 150 has made her feel better but she only takes it once a day in the evening time she forgets the second.  Initially was going to change the medication to Wellbutrin XR 150 to 24 hours.  However patient decided she would make sure to take the medication twice a day as prescribed.  There is also a help with tobacco abuse. -     Discontinue: buPROPion (WELLBUTRIN XL) 150 MG 24 hr tablet; Take 1 tablet (150 mg total) by mouth daily. -     buPROPion (WELLBUTRIN SR) 150 MG 12 hr tablet; Take 1 tablet (150 mg total) by mouth 2 (two) times daily.  Mixed hyperlipidemia  Healthy lifestyle diet of fruits vegetables fish nuts whole grains and low saturated fat . Foods high in cholesterol or liver, fatty meats,cheese, butter avocados, nuts and seeds, chocolate and fried foods. -     Lipid panel  Hypokalemia -     CMP14+EGFR  Tobacco abuse - I have recommended complete cessation of tobacco use. I have discussed various options available for assistance with tobacco cessation including over the counter methods (Nicotine gum, patch and lozenges). We also discussed prescription options (Chantix, Nicotine Inhaler / Nasal Spray). The patient is interested in pursuing any prescription tobacco cessation options at this time. - Patient declines at this time.   -     Ambulatory Referral Lung Cancer Screening [facility] -     buPROPion (WELLBUTRIN SR) 150 MG 12 hr tablet; Take 1 tablet (150 mg total) by mouth 2 (two) times daily.  Morbid obesity ([place]) Discussed diet and exercise for person with BMI >25. Instructed: You must burn more calories than you eat. Losing 5 percent of your body weight should be considered a success. In the longer term, losing more than 15 percent of your body weight and staying at this weight is an extremely good result. However, keep in mind that even losing 5 percent  of your body weight leads to important health benefits, so try not to get discouraged if you're not able to lose more than this. Will recheck weight in 3-6 months.   Prediabetes - educated on lifestyle modifications, including but not limited to diet choices and adding exercise to daily routine.   -     CBC with Differential/Platelet  Paresthesia -     gabapentin (NEURONTIN) 100 MG capsule; Take 1 capsule (100 mg total) by mouth 3 (three) times daily.  Patient have been counseled extensively about nutrition and exercise. Other issues discussed during this visit include: low cholesterol diet, weight control and daily exercise, foot care, annual eye examinations at Ophthalmology, importance of adherence with medications and regular follow-up. We also discussed long term complications of uncontrolled diabetes and hypertension.   No follow-ups on file.  The patient was given clear instructions to go to ER  or return to medical center if symptoms don't improve, worsen or new problems develop. The patient verbalized understanding. The patient was told to call to get lab results if they haven't heard anything in the next week.   This note has been created with Surveyor, quantity. Any transcriptional errors are unintentional.   Kerin Perna, NP 04/19/2022, 11:55 AM

## 2022-04-16 ENCOUNTER — Ambulatory Visit (INDEPENDENT_AMBULATORY_CARE_PROVIDER_SITE_OTHER): Payer: No Typology Code available for payment source | Admitting: Primary Care

## 2022-04-16 LAB — CBC WITH DIFFERENTIAL/PLATELET
Basophils Absolute: 0.1 x10E3/uL (ref 0.0–0.2)
Basos: 1 %
EOS (ABSOLUTE): 0.3 x10E3/uL (ref 0.0–0.4)
Eos: 3 %
Hematocrit: 42 % (ref 34.0–46.6)
Hemoglobin: 14.1 g/dL (ref 11.1–15.9)
Immature Grans (Abs): 0 x10E3/uL (ref 0.0–0.1)
Immature Granulocytes: 0 %
Lymphocytes Absolute: 2.1 x10E3/uL (ref 0.7–3.1)
Lymphs: 23 %
MCH: 30.1 pg (ref 26.6–33.0)
MCHC: 33.6 g/dL (ref 31.5–35.7)
MCV: 90 fL (ref 79–97)
Monocytes Absolute: 0.8 x10E3/uL (ref 0.1–0.9)
Monocytes: 8 %
Neutrophils Absolute: 6.1 x10E3/uL (ref 1.4–7.0)
Neutrophils: 65 %
Platelets: 221 x10E3/uL (ref 150–450)
RBC: 4.68 x10E6/uL (ref 3.77–5.28)
RDW: 13.2 % (ref 11.7–15.4)
WBC: 9.4 x10E3/uL (ref 3.4–10.8)

## 2022-04-16 LAB — CMP14+EGFR
ALT: 13 IU/L (ref 0–32)
AST: 23 IU/L (ref 0–40)
Albumin/Globulin Ratio: 1.3 (ref 1.2–2.2)
Albumin: 4.1 g/dL (ref 3.8–4.9)
Alkaline Phosphatase: 72 IU/L (ref 44–121)
BUN/Creatinine Ratio: 17 (ref 9–23)
BUN: 14 mg/dL (ref 6–24)
Bilirubin Total: 0.3 mg/dL (ref 0.0–1.2)
CO2: 23 mmol/L (ref 20–29)
Calcium: 9.5 mg/dL (ref 8.7–10.2)
Chloride: 102 mmol/L (ref 96–106)
Creatinine, Ser: 0.84 mg/dL (ref 0.57–1.00)
Globulin, Total: 3.1 g/dL (ref 1.5–4.5)
Glucose: 91 mg/dL (ref 70–99)
Sodium: 141 mmol/L (ref 134–144)
Total Protein: 7.2 g/dL (ref 6.0–8.5)
eGFR: 80 mL/min/{1.73_m2} (ref >59)

## 2022-04-16 LAB — LIPID PANEL
Chol/HDL Ratio: 4.5 ratio — ABNORMAL HIGH (ref 0.0–4.4)
Cholesterol, Total: 185 mg/dL (ref 100–199)
HDL: 41 mg/dL (ref >39)
LDL Chol Calc (NIH): 126 mg/dL — ABNORMAL HIGH (ref 0–99)
Triglycerides: 99 mg/dL (ref 0–149)
VLDL Cholesterol Cal: 18 mg/dL (ref 5–40)

## 2022-04-17 ENCOUNTER — Other Ambulatory Visit (INDEPENDENT_AMBULATORY_CARE_PROVIDER_SITE_OTHER): Payer: Self-pay | Admitting: Primary Care

## 2022-04-17 MED ORDER — ATORVASTATIN CALCIUM 20 MG PO TABS: 20.0000 mg | ORAL_TABLET | Freq: Every day | ORAL | 1 refills | Status: DC

## 2022-05-13 ENCOUNTER — Other Ambulatory Visit (INDEPENDENT_AMBULATORY_CARE_PROVIDER_SITE_OTHER): Payer: Self-pay | Admitting: Primary Care

## 2022-05-13 DIAGNOSIS — R202 Paresthesia of skin: Secondary | ICD-10-CM

## 2022-05-13 NOTE — Telephone Encounter (Signed)
Requested Prescriptions  Pending Prescriptions Disp Refills   gabapentin (NEURONTIN) 100 MG capsule [Pharmacy Med Name: GABAPENTIN 100 MG CAPSULE] 270 capsule 0    Sig: TAKE ONE CAPSULE BY MOUTH THREE TIMES A DAY     Neurology: Anticonvulsants - gabapentin Passed - 05/13/2022  6:10 AM      Passed - Cr in normal range and within 360 days    Creatinine, Ser  Date Value Ref Range Status  04/15/2022 0.84 0.57 - 1.00 mg/dL Final         Passed - Completed PHQ-2 or PHQ-9 in the last 360 days      Passed - Valid encounter within last 12 months    Recent Outpatient Visits           4 weeks ago Bilateral low back pain, unspecified chronicity, unspecified whether sciatica present   [facility] Gwinda Passe P, NP   2 months ago Moderate episode of recurrent major depressive disorder (HCC)   [facility] Gwinda Passe P, NP   3 months ago Bilateral low back pain, unspecified chronicity, unspecified whether sciatica present   [facility] Grayce Sessions, NP   3 months ago Screening for diabetes mellitus   [facility] Grayce Sessions, NP   7 months ago Morbid obesity (HCC)   [facility] Grayce Sessions, NP       Future Appointments             In 2 months Randa Evens, Kinnie Scales, NP [facility]

## 2022-06-06 ENCOUNTER — Ambulatory Visit (INDEPENDENT_AMBULATORY_CARE_PROVIDER_SITE_OTHER): Payer: Medicaid Other | Admitting: Primary Care

## 2022-06-06 ENCOUNTER — Ambulatory Visit (INDEPENDENT_AMBULATORY_CARE_PROVIDER_SITE_OTHER): Payer: Self-pay

## 2022-06-06 ENCOUNTER — Other Ambulatory Visit (INDEPENDENT_AMBULATORY_CARE_PROVIDER_SITE_OTHER): Payer: Self-pay | Admitting: Primary Care

## 2022-06-06 DIAGNOSIS — I10 Essential (primary) hypertension: Secondary | ICD-10-CM

## 2022-06-06 DIAGNOSIS — R0989 Other specified symptoms and signs involving the circulatory and respiratory systems: Secondary | ICD-10-CM | POA: Diagnosis not present

## 2022-06-06 DIAGNOSIS — Z720 Tobacco use: Secondary | ICD-10-CM

## 2022-06-06 DIAGNOSIS — Z760 Encounter for issue of repeat prescription: Secondary | ICD-10-CM

## 2022-06-06 DIAGNOSIS — R202 Paresthesia of skin: Secondary | ICD-10-CM

## 2022-06-06 DIAGNOSIS — F331 Major depressive disorder, recurrent, moderate: Secondary | ICD-10-CM

## 2022-06-06 MED ORDER — LOSARTAN POTASSIUM-HCTZ 100-25 MG PO TABS: 1.0000 | ORAL_TABLET | Freq: Every day | ORAL | 1 refills | Status: DC

## 2022-06-06 MED ORDER — GABAPENTIN 100 MG PO CAPS: 100.0000 mg | ORAL_CAPSULE | Freq: Three times a day (TID) | ORAL | 0 refills | Status: DC

## 2022-06-06 MED ORDER — AMLODIPINE BESYLATE 10 MG PO TABS: ORAL_TABLET | ORAL | 1 refills | Status: DC

## 2022-06-06 MED ORDER — BUPROPION HCL ER (SR) 150 MG PO TB12: 150.0000 mg | ORAL_TABLET | Freq: Two times a day (BID) | ORAL | 1 refills | Status: DC

## 2022-06-06 MED ORDER — ATORVASTATIN CALCIUM 20 MG PO TABS: 20.0000 mg | ORAL_TABLET | Freq: Every day | ORAL | 1 refills | Status: DC

## 2022-06-06 NOTE — Telephone Encounter (Signed)
Resending to new pharmacy d/t pt insurance changed and more expensive at [employer] than [place].  Requested Prescriptions  Pending Prescriptions Disp Refills   amLODipine (NORVASC) 10 MG tablet 90 tablet 1    Sig: TAKE ONE TABLET BY MOUTH DAILY     Cardiovascular: Calcium Channel Blockers 2 Passed - 06/06/2022 11:37 AM      Passed - Last BP in normal range    BP Readings from Last 1 Encounters:  04/15/22 128/80         Passed - Last Heart Rate in normal range    Pulse Readings from Last 1 Encounters:  04/15/22 64         Passed - Valid encounter within last 6 months    Recent Outpatient Visits           1 month ago Bilateral low back pain, unspecified chronicity, unspecified whether sciatica present   [place], Michelle P, NP   3 months ago Moderate episode of recurrent major depressive disorder ([place])   [place] Juluis Mire P, NP   3 months ago Bilateral low back pain, unspecified chronicity, unspecified whether sciatica present   [place], [place], NP   4 months ago Screening for diabetes mellitus   [place], Michelle P, NP   7 months ago Morbid obesity ([place])   [place] Kerin Perna, NP       Future Appointments             Today Kerin Perna, NP [employer]   In 1 month [place], Milford Cage, NP [facility]             atorvastatin (LIPITOR) 20 MG tablet 90 tablet 1    Sig: Take 1 tablet (20 mg total) by mouth daily.     Cardiovascular:  Antilipid - Statins Failed - 06/06/2022 11:37 AM      Failed - Lipid Panel in normal range within the last 12 months    Cholesterol, Total  Date Value Ref Range Status  04/15/2022 185 100 - 199 mg/dL Final   LDL Chol Calc (NIH)  Date Value Ref Range Status  04/15/2022 126 (H) 0 - 99 mg/dL Final    HDL  Date Value Ref Range Status  04/15/2022 41 >39 mg/dL Final   Triglycerides  Date Value Ref Range Status  04/15/2022 99 0 - 149 mg/dL Final         Passed - Patient is not pregnant      Passed - Valid encounter within last 12 months    Recent Outpatient Visits           1 month ago Bilateral low back pain, unspecified chronicity, unspecified whether sciatica present   [place], Michelle P, NP   3 months ago Moderate episode of recurrent major depressive disorder ([place])   [place] Juluis Mire P, NP   3 months ago Bilateral low back pain, unspecified chronicity, unspecified whether sciatica present   [place], [place], NP   4 months ago Screening for diabetes mellitus   [place] Kerin Perna, NP   7 months ago Morbid obesity ([place])   [place], Michelle P, NP       Future Appointments  Today Kerin Perna, NP [place]   In 1 month Edwards, Milford Cage, NP [facility]             buPROPion (WELLBUTRIN SR) 150 MG 12 hr tablet 180 tablet 1    Sig: Take 1 tablet (150 mg total) by mouth 2 (two) times daily.     Psychiatry: Antidepressants - bupropion Passed - 06/06/2022 11:37 AM      Passed - Cr in normal range and within 360 days    Creatinine, Ser  Date Value Ref Range Status  04/15/2022 0.84 0.57 - 1.00 mg/dL Final         Passed - AST in normal range and within 360 days    AST  Date Value Ref Range Status  04/15/2022 23 0 - 40 IU/L Final         Passed - ALT in normal range and within 360 days    ALT  Date Value Ref Range Status  04/15/2022 13 0 - 32 IU/L Final         Passed - Last BP in normal range    BP Readings from Last 1 Encounters:  04/15/22 128/80         Passed - Valid encounter within last 6 months     Recent Outpatient Visits           1 month ago Bilateral low back pain, unspecified chronicity, unspecified whether sciatica present   [place], Michelle P, NP   3 months ago Moderate episode of recurrent major depressive disorder ([place])   [place], Michelle P, NP   3 months ago Bilateral low back pain, unspecified chronicity, unspecified whether sciatica present   [place], Michelle P, NP   4 months ago Screening for diabetes mellitus   [place], Michelle P, NP   7 months ago Morbid obesity ([place])   [place] Kerin Perna, NP       Future Appointments             Today Kerin Perna, NP [place]   In 1 month [place], Milford Cage, NP [facility]             gabapentin (NEURONTIN) 100 MG capsule 270 capsule 0    Sig: Take 1 capsule (100 mg total) by mouth 3 (three) times daily.     Neurology: Anticonvulsants - gabapentin Passed - 06/06/2022 11:37 AM      Passed - Cr in normal range and within 360 days    Creatinine, Ser  Date Value Ref Range Status  04/15/2022 0.84 0.57 - 1.00 mg/dL Final         Passed - Completed PHQ-2 or PHQ-9 in the last 360 days      Passed - Valid encounter within last 12 months    Recent Outpatient Visits           1 month ago Bilateral low back pain, unspecified chronicity, unspecified whether sciatica present   [place], Michelle P, NP   3 months ago Moderate episode of recurrent major depressive disorder ([place])   [place] Juluis Mire P, NP   3 months ago Bilateral low back pain, unspecified chronicity, unspecified whether sciatica present   [place]  Kerin Perna, NP   4 months ago Screening for diabetes mellitus   [place], Michelle P, NP   7 months ago Morbid obesity ([place])   [place] Kerin Perna, NP       Future Appointments             Today Kerin Perna, NP [place]   In 1 month Edwards, Milford Cage, NP [facility]             losartan-hydrochlorothiazide (HYZAAR) 100-25 MG tablet 90 tablet 1    Sig: Take 1 tablet by mouth daily.     Cardiovascular: ARB + Diuretic Combos Passed - 06/06/2022 11:37 AM      Passed - K in normal range and within 180 days    Potassium  Date Value Ref Range Status  04/15/2022 CANCELED mmol/L     Comment:    Test not performed. Specimen is hemolyzed. Unable to obtain valid results.  Result canceled by the ancillary.          Passed - Na in normal range and within 180 days    Sodium  Date Value Ref Range Status  04/15/2022 141 134 - 144 mmol/L Final         Passed - Cr in normal range and within 180 days    Creatinine, Ser  Date Value Ref Range Status  04/15/2022 0.84 0.57 - 1.00 mg/dL Final         Passed - eGFR is 10 or above and within 180 days    GFR calc Af Amer  Date Value Ref Range Status  10/04/2019 109 >59 mL/min/1.73 Final    Comment:    **[facility] currently reports eGFR in compliance with the current**   recommendations of the [employer]. [facility] will   update reporting as new guidelines are published from the NKF-ASN   Task force.    GFR, Estimated  Date Value Ref Range Status  10/12/2021 59 (L) >60 mL/min Final    Comment:    (NOTE) Calculated using the CKD-EPI Creatinine Equation (2021)    eGFR  Date Value Ref Range Status  04/15/2022 80 >59 mL/min/1.73 Final         Passed - Patient is not pregnant      Passed - Last BP in normal range    BP Readings from Last 1 Encounters:  04/15/22 128/80         Passed - Valid encounter within last 6 months    Recent Outpatient Visits           1  month ago Bilateral low back pain, unspecified chronicity, unspecified whether sciatica present   [place], Michelle P, NP   3 months ago Moderate episode of recurrent major depressive disorder ([place])   [place] Juluis Mire P, NP   3 months ago Bilateral low back pain, unspecified chronicity, unspecified whether sciatica present   [place], [place], NP   4 months ago Screening for diabetes mellitus   [place], Michelle P, NP   7 months ago Morbid obesity ([place])   [place], Michelle P, NP       Future Appointments             Today Kerin Perna, NP [place]  [facility]   In 1 month Edwards, Milford Cage, NP [place]

## 2022-06-06 NOTE — Telephone Encounter (Signed)
Medication Refill - Medication: amLODipine (NORVASC) 10 MG tablet, losartan-hydrochlorothiazide (HYZAAR) 100-25 MG tablet , atorvastatin (LIPITOR) 20 MG tablet,  Has the patient contacted their pharmacy? Yes.   Pt stated that because her insurance changed, she needs medication transferred to the pharmacy below due to the cost.   (Agent: If yes, when and what did the pharmacy advise?)  Preferred Pharmacy (with phone number or street name):  [id number] [place], [place] - [address] AT [facility] [address] [facility] [address]  [address]  [place] [address]  Phone: [phone] Fax: [phone]  Hours: Not open 24 hours   Has the patient been seen for an appointment in the last year OR does the patient have an upcoming appointment? Yes.    Agent: Please be advised that RX refills may take up to 3 business days. We ask that you follow-up with your pharmacy.

## 2022-06-06 NOTE — Telephone Encounter (Signed)
  Chief Complaint: medication assistance  Symptoms: cough and sore throat, not related to medications Frequency: several days  Pertinent Negatives: NA Disposition: [] ED /[] Urgent Care (no appt availability in office) / [x] Appointment(In office/virtual)/ []  [place] [facility]/ [] Home Care/ [] Refused Recommended Disposition /[] [facility] [facility]/ []  Follow-up with PCP Additional Notes: pt states she was unsure what the atorvastatin was for. Gave message from lab results on 04/15/22 from [place], NP. Advised pt she is able to review these on [place] after provider places notes. Pt asked about pharmacy change and I advised that I would send in meds to new pharmacy for her. Pt asking about potassium and d/t lab being not collected d/t hemolyzed advised pt to ask , NP today when comes in for OV if needs to be recollected. Pt c/o cough and sore throat so scheduled appt at 1530 today. Pt verbalized understanding.   Summary: med management.   Pt stated this is a new medication atorvastatin (LIPITOR) 20 MG tablet, that was prescribed to her, and she is unsure of what it is for or why it was prescribed.  Pt seeking clinical advice.         Reason for Disposition  Caller has medicine question only, adult not sick, AND triager answers question  Answer Assessment - Initial Assessment Questions 1. NAME of MEDICINE: "What medicine(s) are you calling about?"     atorvastatin 2. QUESTION: "What is your question?" (e.g., double dose of medicine, side effect)     Unsure what it was for  3. PRESCRIBER: "Who prescribed the medicine?" Reason: if prescribed by specialist, call should be referred to that group.     , NP 4. Other symptoms     Cough and sore throat  Protocols used: Medication Question Call-A-AH

## 2022-06-07 ENCOUNTER — Other Ambulatory Visit (INDEPENDENT_AMBULATORY_CARE_PROVIDER_SITE_OTHER): Payer: Self-pay | Admitting: Primary Care

## 2022-06-11 ENCOUNTER — Encounter (INDEPENDENT_AMBULATORY_CARE_PROVIDER_SITE_OTHER): Payer: Self-pay | Admitting: Primary Care

## 2022-06-11 ENCOUNTER — Other Ambulatory Visit (INDEPENDENT_AMBULATORY_CARE_PROVIDER_SITE_OTHER): Payer: Self-pay | Admitting: Primary Care

## 2022-06-11 LAB — COVID-19, FLU A+B AND RSV
Influenza A, NAA: NOT DETECTED
Influenza B, NAA: NOT DETECTED
RSV, NAA: NOT DETECTED
SARS-CoV-2, NAA: DETECTED — AB

## 2022-06-11 LAB — SPECIMEN STATUS REPORT

## 2022-06-11 MED ORDER — NIRMATRELVIR/RITONAVIR (PAXLOVID)TABLET: 3.0000 | ORAL_TABLET | Freq: Two times a day (BID) | ORAL | 0 refills | Status: AC

## 2022-06-12 NOTE — Progress Notes (Signed)
Sharon Dyer, is a 57 y.o. female  ZOX:[id number]  [id number]  DOB - 1965/10/13  Chief Complaint  Patient presents with   Sore Throat   Cough       Subjective:   Sharon Dyer is a 57 y.o. female here today for a acute visit. Patient has  headache, sore throat, fever and chills with scratchy throat.  She is also upset because her mom is an ICU she had a stroke on Christmas Eve.  With the symptoms unclear if she should go visit her mom would not.  Explain lets get a respiratory panel to rule out COVID, RSV, or flu and when results come in she will be notified.  Patient in agreement with this  No problems updated.  No Known Allergies  Past Medical History:  Diagnosis Date   Anxiety    Arthritis    Patient verbalizes she has athritis to right ankle   Dyspnea    Frozen shoulder syndrome 07/30/2019   Hypertension    Obesity     Current Outpatient Medications on File Prior to Visit  Medication Sig Dispense Refill   acetaminophen (TYLENOL) 500 MG tablet Take 500 mg by mouth every 6 (six) hours as needed for moderate pain.     amLODipine (NORVASC) 10 MG tablet TAKE ONE TABLET BY MOUTH DAILY 90 tablet 1   atorvastatin (LIPITOR) 20 MG tablet Take 1 tablet (20 mg total) by mouth daily. 90 tablet 1   buPROPion (WELLBUTRIN SR) 150 MG 12 hr tablet Take 1 tablet (150 mg total) by mouth 2 (two) times daily. 180 tablet 1   gabapentin (NEURONTIN) 100 MG capsule Take 1 capsule (100 mg total) by mouth 3 (three) times daily. 270 capsule 0   ibuprofen (ADVIL) 800 MG tablet Take 1 tablet (800 mg total) by mouth every 8 (eight) hours as needed. 90 tablet 1   losartan-hydrochlorothiazide (HYZAAR) 100-25 MG tablet Take 1 tablet by mouth daily. 90 tablet 1   potassium chloride SA (KLOR-CON M) 20 MEQ tablet Take 1 tablet (20 mEq total) by mouth daily. 30 tablet 0   No current facility-administered medications on file prior to visit.    Objective:  No  VS  Exam General appearance : Awake, alert,  distress. Speech Clear. Not toxic looking HEENT: Atraumatic and Normocephalic, pupils equally reactive to light and accomodation Neck: Supple, no JVD. No cervical lymphadenopathy.  Chest: Good air entry bilaterally, scattered wheezes  CVS: S1 S2 regular, no murmurs.  Abdomen: Bowel sounds present, Non tender and not distended with no gaurding, rigidity or rebound. Extremities: B/L Lower Ext shows no edema, both legs are warm to touch  Data Review Lab Results  Component Value Date   HGBA1C 5.9 (A) 02/05/2022    Assessment & Plan   1. Respiratory symptoms - COVID-19, Flu A+B and RSV    Patient have been counseled extensively about nutrition and exercise. Other issues discussed during this visit include: low cholesterol diet, weight control and daily exercise, foot care, annual eye examinations at Ophthalmology, importance of adherence with medications and regular follow-up. We also discussed long term complications of uncontrolled diabetes and hypertension.    The patient was given clear instructions to go to ER or return to medical center if symptoms don't improve, worsen or new problems develop. The patient verbalized understanding. The patient was told to call to get lab results if they haven't heard anything in the next week.   This note has been created with  Dragon Geophysicist/field seismologist. Any transcriptional errors are unintentional.   Kerin Perna, NP 06/12/2022, 3:34 PM

## 2022-06-17 ENCOUNTER — Encounter (INDEPENDENT_AMBULATORY_CARE_PROVIDER_SITE_OTHER): Payer: Self-pay | Admitting: Primary Care

## 2022-07-16 ENCOUNTER — Ambulatory Visit (INDEPENDENT_AMBULATORY_CARE_PROVIDER_SITE_OTHER): Payer: No Typology Code available for payment source | Admitting: Primary Care

## 2022-07-16 ENCOUNTER — Encounter (INDEPENDENT_AMBULATORY_CARE_PROVIDER_SITE_OTHER): Payer: Self-pay | Admitting: Primary Care

## 2022-07-16 VITALS — BP 120/82 | HR 62 | Resp 16 | Wt 285.0 lb

## 2022-07-16 DIAGNOSIS — Z0130 Encounter for examination of blood pressure without abnormal findings: Secondary | ICD-10-CM | POA: Diagnosis not present

## 2022-07-16 DIAGNOSIS — I70299 Other atherosclerosis of native arteries of extremities, unspecified extremity: Secondary | ICD-10-CM

## 2022-07-16 DIAGNOSIS — R202 Paresthesia of skin: Secondary | ICD-10-CM

## 2022-07-16 DIAGNOSIS — G629 Polyneuropathy, unspecified: Secondary | ICD-10-CM | POA: Diagnosis not present

## 2022-07-16 DIAGNOSIS — F4323 Adjustment disorder with mixed anxiety and depressed mood: Secondary | ICD-10-CM | POA: Diagnosis not present

## 2022-07-16 MED ORDER — BUSPIRONE HCL 5 MG PO TABS: 5.0000 mg | ORAL_TABLET | Freq: Two times a day (BID) | ORAL | 2 refills | Status: DC

## 2022-07-16 MED ORDER — GABAPENTIN 100 MG PO CAPS: 100.0000 mg | ORAL_CAPSULE | Freq: Three times a day (TID) | ORAL | 1 refills | Status: DC

## 2022-07-16 NOTE — Addendum Note (Signed)
Addended by: Juluis Mire on: 07/16/2022 11:51 AM   Modules accepted: Orders

## 2022-07-16 NOTE — Progress Notes (Addendum)
[place]   Ms.Sharon Dyer Dyer is a 57 y.o. morbid obese female presents for hypertension evaluation, Denies shortness of breath, headaches, chest pain or lower extremity edema, sudden onset, vision changes, unilateral weakness, dizziness, paresthesias.  She is in turmoil her mother has been in the hospital since December 24 , 2023 with a massive stroke and paralyzed on the left side. Her mother provider is Dr. Wyatt Portela and he has been wandering how she was doing. We called him and he talked with my patient and discussed option for her and made sure she knew if she needed anything he was available.   Patient reports adherence with medications.  Dietary habits include: monitor sodium in take and no process  Exercise habits include:walking Family / Social history: CVA, HTN , MI mother and breast cancer savior    Past Medical History:  Diagnosis Date   Anxiety    Arthritis    Patient verbalizes she has athritis to right ankle   Dyspnea    Frozen shoulder syndrome 07/30/2019   Hypertension    Obesity    Past Surgical History:  Procedure Laterality Date   ECTOPIC PREGNANCY SURGERY  X 2   No Known Allergies Current Outpatient Medications on File Prior to Visit  Medication Sig Dispense Refill   acetaminophen (TYLENOL) 500 MG tablet Take 500 mg by mouth every 6 (six) hours as needed for moderate pain.     amLODipine (NORVASC) 10 MG tablet TAKE ONE TABLET BY MOUTH DAILY 90 tablet 1   atorvastatin (LIPITOR) 20 MG tablet Take 1 tablet (20 mg total) by mouth daily. 90 tablet 1   buPROPion (WELLBUTRIN SR) 150 MG 12 hr tablet Take 1 tablet (150 mg total) by mouth 2 (two) times daily. 180 tablet 1   gabapentin (NEURONTIN) 100 MG capsule Take 1 capsule (100 mg total) by mouth 3 (three) times daily. 270 capsule 0   ibuprofen (ADVIL) 800 MG tablet Take 1 tablet (800 mg total) by mouth every 8 (eight) hours as needed. 90 tablet 1   losartan-hydrochlorothiazide (HYZAAR) 100-25 MG  tablet Take 1 tablet by mouth daily. 90 tablet 1   potassium chloride SA (KLOR-CON M) 20 MEQ tablet Take 1 tablet (20 mEq total) by mouth daily. 30 tablet 0   No current facility-administered medications on file prior to visit.   Social History   Socioeconomic History   Marital status: Divorced    Spouse name: Not on file   Number of children: Not on file   Years of education: Not on file   Highest education level: Not on file  Occupational History   Not on file  Tobacco Use   Smoking status: Every Day    Packs/day: 1.00    Years: 37.00    Total pack years: 37.00    Types: Cigarettes    Start date: 98   Smokeless tobacco: Never   Tobacco comments:    Started on Wellbutrin for depression  Vaping Use   Vaping Use: Never used  Substance and Sexual Activity   Alcohol use: Not Currently    Comment: 12/22/2017 "maybe twice a year"   Drug use: Never   Sexual activity: Yes    Birth control/protection: None  Other Topics Concern   Not on file  Social History Narrative   Not on file   Social Determinants of Health   Financial Resource Strain: Not on file  Food Insecurity: Not on file  Transportation Needs: Not on file  Physical Activity: Not  on file  Stress: Not on file  Social Connections: Not on file  Intimate Partner Violence: Not on file   Family History  Problem Relation Age of Onset   Breast cancer Mother        in 33s   Hypertension Mother      OBJECTIVE:  Vitals:   07/16/22 1017  BP: 120/82  Pulse: 62  Resp: 16  SpO2: 100%  Weight: 285 lb (129.3 kg)    Physical Exam Vitals reviewed.  Constitutional:      Appearance: She is obese.     Comments: Morbid obesity  HENT:     Head: Normocephalic.     Right Ear: Tympanic membrane and external ear normal.     Left Ear: Tympanic membrane and external ear normal.     Nose: Nose normal.  Eyes:     Extraocular Movements: Extraocular movements intact.     Pupils: Pupils are equal, round, and reactive to  light.  Cardiovascular:     Rate and Rhythm: Normal rate and regular rhythm.  Pulmonary:     Effort: Pulmonary effort is normal.     Breath sounds: Normal breath sounds.  Abdominal:     General: Bowel sounds are normal. There is distension.     Palpations: Abdomen is soft.  Musculoskeletal:        General: Normal range of motion.     Cervical back: Normal range of motion and neck supple.  Skin:    General: Skin is warm and dry.  Neurological:     Mental Status: She is alert and oriented to person, place, and time.  Psychiatric:        Mood and Affect: Mood normal.        Behavior: Behavior normal.        Thought Content: Thought content normal.        Judgment: Judgment normal.    ROS Comprehensive ROS Pertinent positive and negative noted in HPI   Last 3 Office BP readings: BP Readings from Last 3 Encounters:  07/16/22 120/82  04/15/22 128/80  03/05/22 118/74    BMET    Component Value Date/Time   NA 141 04/15/2022 1052   K CANCELED 04/15/2022 1052   CL 102 04/15/2022 1052   CO2 23 04/15/2022 1052   GLUCOSE 91 04/15/2022 1052   GLUCOSE 115 (H) 10/12/2021 2249   BUN 14 04/15/2022 1052   CREATININE 0.84 04/15/2022 1052   CALCIUM 9.5 04/15/2022 1052   GFRNONAA 59 (L) 10/12/2021 2249   GFRAA 109 10/04/2019 1032    Renal function: CrCl cannot be calculated (Patient's most recent lab result is older than the maximum 21 days allowed.).  Clinical ASCVD: Yes  The 10-year ASCVD risk score (Arnett DK, et al., 2019) is: 11%   Values used to calculate the score:     Age: 33 years     Sex: Female     Is Non-Hispanic African American: Yes     Diabetic: No     Tobacco smoker: Yes     Systolic Blood Pressure: 400 mmHg     Is BP treated: Yes     HDL Cholesterol: 41 mg/dL     Total Cholesterol: 185 mg/dL  ASCVD risk factors include- [place]  ASSESSMENT & PLAN: Sharon Dyer was seen today for hypertension.  Diagnoses and all orders for this visit:  BP check Unremarkable  well controlled < 130/80. Also counseled patient about the importance of medication adherence. If you participate in smoking,  it is important to stop using tobacco as this will increase the risks associated with uncontrolled blood pressure.  -Goal BP:  For patients younger than 60: Goal BP < 130/80. For patients 60 and older: Goal BP < 140/90. For patients with diabetes: Goal BP < 130/80. Your most recent BP: 120/82  Minimize salt intake. Minimize alcohol intake  Adjustment disorder with mixed anxiety and depressed mood Added Buspar and previously started Wellbutrin for smoking and depression due to mother situation smoking has increased due to stress. -     busPIRone (BUSPAR) 5 MG tablet; Take 1 tablet (5 mg total) by mouth 2 (two) times daily. Refer to CSW-     Paresthesia 2/2 Neuropathy Feet stay cold and numb will refer to vascular  -     gabapentin (NEURONTIN) 100 MG capsule; Take 1 capsule (100 mg total) by mouth 3 (three) times daily.  Rest Pain I70.299 2/2 Atherosclerosis of native artery of extremity with other clinical manifestation, unspecified extremity (HCC)  cold numbness and tingling , legs throb has to hang off the bed for relief   Morbid obesity (HCC)  Morbid Obesity is > 40 indicating an excess in caloric intake or underlining conditions. Has  lead to other co-morbidities. Educated on lifestyle modifications of diet and exercise which may reduce obesity.   This note has been created with Surveyor, quantity. Any transcriptional errors are unintentional.   Sharon Perna, NP 07/16/2022, 10:37 AM

## 2022-07-16 NOTE — Patient Instructions (Signed)
Calorie Counting for Weight Loss Calories are units of energy. Your body needs a certain number of calories from food to keep going throughout the day. When you eat or drink more calories than your body needs, your body stores the extra calories mostly as fat. When you eat or drink fewer calories than your body needs, your body burns fat to get the energy it needs. Calorie counting means keeping track of how many calories you eat and drink each day. Calorie counting can be helpful if you need to lose weight. If you eat fewer calories than your body needs, you should lose weight. Ask your health care provider what a healthy weight is for you. For calorie counting to work, you will need to eat the right number of calories each day to lose a healthy amount of weight per week. A dietitian can help you figure out how many calories you need in a day and will suggest ways to reach your calorie goal. A healthy amount of weight to lose each week is usually 1-2 lb (0.5-0.9 kg). This usually means that your daily calorie intake should be reduced by 500-750 calories. Eating 1,200-1,500 calories a day can help most women lose weight. Eating 1,500-1,800 calories a day can help most men lose weight. What do I need to know about calorie counting? Work with your health care provider or dietitian to determine how many calories you should get each day. To meet your daily calorie goal, you will need to: Find out how many calories are in each food that you would like to eat. Try to do this before you eat. Decide how much of the food you plan to eat. Keep a food log. Do this by writing down what you ate and how many calories it had. To successfully lose weight, it is important to balance calorie counting with a healthy lifestyle that includes regular activity. Where do I find calorie information?  The number of calories in a food can be found on a Nutrition Facts label. If a food does not have a Nutrition Facts label, try  to look up the calories online or ask your dietitian for help. Remember that calories are listed per serving. If you choose to have more than one serving of a food, you will have to multiply the calories per serving by the number of servings you plan to eat. For example, the label on a package of bread might say that a serving size is 1 slice and that there are 90 calories in a serving. If you eat 1 slice, you will have eaten 90 calories. If you eat 2 slices, you will have eaten 180 calories. How do I keep a food log? After each time that you eat, record the following in your food log as soon as possible: What you ate. Be sure to include toppings, sauces, and other extras on the food. How much you ate. This can be measured in cups, ounces, or number of items. How many calories were in each food and drink. The total number of calories in the food you ate. Keep your food log near you, such as in a pocket-sized notebook or on an app or website on your mobile phone. Some programs will calculate calories for you and show you how many calories you have left to meet your daily goal. What are some portion-control tips? Know how many calories are in a serving. This will help you know how many servings you can have of a certain   food. Use a measuring cup to measure serving sizes. You could also try weighing out portions on a kitchen scale. With time, you will be able to estimate serving sizes for some foods. Take time to put servings of different foods on your favorite plates or in your favorite bowls and cups so you know what a serving looks like. Try not to eat straight from a food's packaging, such as from a bag or box. Eating straight from the package makes it hard to see how much you are eating and can lead to overeating. Put the amount you would like to eat in a cup or on a plate to make sure you are eating the right portion. Use smaller plates, glasses, and bowls for smaller portions and to prevent  overeating. Try not to multitask. For example, avoid watching TV or using your computer while eating. If it is time to eat, sit down at a table and enjoy your food. This will help you recognize when you are full. It will also help you be more mindful of what and how much you are eating. What are tips for following this plan? Reading food labels Check the calorie count compared with the serving size. The serving size may be smaller than what you are used to eating. Check the source of the calories. Try to choose foods that are high in protein, fiber, and vitamins, and low in saturated fat, trans fat, and sodium. Shopping Read nutrition labels while you shop. This will help you make healthy decisions about which foods to buy. Pay attention to nutrition labels for low-fat or fat-free foods. These foods sometimes have the same number of calories or more calories than the full-fat versions. They also often have added sugar, starch, or salt to make up for flavor that was removed with the fat. Make a grocery list of lower-calorie foods and stick to it. Cooking Try to cook your favorite foods in a healthier way. For example, try baking instead of frying. Use low-fat dairy products. Meal planning Use more fruits and vegetables. One-half of your plate should be fruits and vegetables. Include lean proteins, such as chicken, turkey, and fish. Lifestyle Each week, aim to do one of the following: 150 minutes of moderate exercise, such as walking. 75 minutes of vigorous exercise, such as running. General information Know how many calories are in the foods you eat most often. This will help you calculate calorie counts faster. Find a way of tracking calories that works for you. Get creative. Try different apps or programs if writing down calories does not work for you. What foods should I eat?  Eat nutritious foods. It is better to have a nutritious, high-calorie food, such as an avocado, than a food with  few nutrients, such as a bag of potato chips. Use your calories on foods and drinks that will fill you up and will not leave you hungry soon after eating. Examples of foods that fill you up are nuts and nut butters, vegetables, lean proteins, and high-fiber foods such as whole grains. High-fiber foods are foods with more than 5 g of fiber per serving. Pay attention to calories in drinks. Low-calorie drinks include water and unsweetened drinks. The items listed above may not be a complete list of foods and beverages you can eat. Contact a dietitian for more information. What foods should I limit? Limit foods or drinks that are not good sources of vitamins, minerals, or protein or that are high in unhealthy fats. These   include: Candy. Other sweets. Sodas, specialty coffee drinks, alcohol, and juice. The items listed above may not be a complete list of foods and beverages you should avoid. Contact a dietitian for more information. How do I count calories when eating out? Pay attention to portions. Often, portions are much larger when eating out. Try these tips to keep portions smaller: Consider sharing a meal instead of getting your own. If you get your own meal, eat only half of it. Before you start eating, ask for a container and put half of your meal into it. When available, consider ordering smaller portions from the menu instead of full portions. Pay attention to your food and drink choices. Knowing the way food is cooked and what is included with the meal can help you eat fewer calories. If calories are listed on the menu, choose the lower-calorie options. Choose dishes that include vegetables, fruits, whole grains, low-fat dairy products, and lean proteins. Choose items that are boiled, broiled, grilled, or steamed. Avoid items that are buttered, battered, fried, or served with cream sauce. Items labeled as crispy are usually fried, unless stated otherwise. Choose water, low-fat milk,  unsweetened iced tea, or other drinks without added sugar. If you want an alcoholic beverage, choose a lower-calorie option, such as a glass of wine or light beer. Ask for dressings, sauces, and syrups on the side. These are usually high in calories, so you should limit the amount you eat. If you want a salad, choose a garden salad and ask for grilled meats. Avoid extra toppings such as bacon, cheese, or fried items. Ask for the dressing on the side, or ask for olive oil and vinegar or lemon to use as dressing. Estimate how many servings of a food you are given. Knowing serving sizes will help you be aware of how much food you are eating at restaurants. Where to find more information Centers for Disease Control and Prevention: http://www.wolf.info/ U.[place]. Department of Agriculture: http://www.wilson-mendoza.org/ Summary Calorie counting means keeping track of how many calories you eat and drink each day. If you eat fewer calories than your body needs, you should lose weight. A healthy amount of weight to lose per week is usually 1-2 lb (0.5-0.9 kg). This usually means reducing your daily calorie intake by 500-750 calories. The number of calories in a food can be found on a Nutrition Facts label. If a food does not have a Nutrition Facts label, try to look up the calories online or ask your dietitian for help. Use smaller plates, glasses, and bowls for smaller portions and to prevent overeating. Use your calories on foods and drinks that will fill you up and not leave you hungry shortly after a meal. This information is not intended to replace advice given to you by your health care provider. Make sure you discuss any questions you have with your health care provider. Document Revised: 07/08/2019 Document Reviewed: 07/08/2019 Elsevier Patient Education  [place] is an infection. It gives you a painful skin rash and blisters that have fluid in them. Shingles is caused by the same germ (virus) that  causes chickenpox. Shingles only happens in people who: Have had chickenpox. Have been given a shot (vaccine) to protect against chickenpox. Shingles is rare in this group. What are the causes? This condition is caused by varicella-zoster virus. This is the same germ that causes chickenpox. After a person is exposed to the germ, the germ stays in the body but is not active (  dormant). Shingles develops if the germ becomes active again (is reactivated). This can happen many years after the first exposure to the germ. It is not known what causes this germ to become active again. What increases the risk? People who have had chickenpox or received the chickenpox shot are at risk for shingles. This infection is more common in people who: Are older than 59 years of age. Have a weakened disease-fighting system (immune system), such as people with: HIV (human immunodeficiency virus). AIDS (acquired immunodeficiency syndrome). Cancer. Are taking medicines that weaken the immune system, such as organ transplant medicines. Have a lot of stress. What are the signs or symptoms? The first symptoms of shingles may be itching, tingling, or pain in an area on your skin. A rash will show on your skin a few days or weeks later. This is what usually happens: The rash is likely to be on one side of your body. The rash usually has a shape like a belt or a band. Over time, the rash turns into fluid-filled blisters. The blisters will break open and change into scabs. The scabs usually dry up in about 2-3 weeks. You may also have: A fever. Chills. A headache. A feeling like you may vomit (nausea). How is this treated? The rash may last for several weeks. There is not a specific cure for this condition. Your doctor may prescribe medicines. Medicines may: Help with pain. Help you get better sooner. Help to prevent long-term problems. Help with itching (antihistamines). If the area involved is on your face,  you may need to see a specialist. This may be an eye doctor or an ear, nose, and throat (ENT) doctor. Follow these instructions at home: Medicines Take over-the-counter and prescription medicines only as told by your doctor. Put on an anti-itch cream or numbing cream where you have a rash, blisters, or scabs. Do this as told by your doctor. Helping with itching and discomfort  Put cold, wet cloths (cold compresses) on the area of the rash or blisters as told by your doctor. Cool baths can help you feel better. Try adding baking soda or dry oatmeal to the water to lessen itching. Do not bathe in hot water. Use calamine lotion as told by your doctor. Blister and rash care Keep your rash covered with a loose bandage (dressing). Wear loose clothing that does not rub on your rash. Wash your hands with soap and water for at least 20 seconds before and after you change your bandage. If you cannot use soap and water, use hand sanitizer. Change your bandage as told by your doctor. Keep your rash and blisters clean. To do this, wash the area with mild soap and cool water as told by your doctor. Check your rash every day for signs of infection. Check for: More redness, swelling, or pain. Fluid or blood. Warmth. Pus or a bad smell. Do not scratch your rash. Do not pick at your blisters. To help you to not scratch: Keep your fingernails clean and cut short. Wear gloves or mittens when you sleep, if scratching is a problem. General instructions Rest as told by your doctor. Wash your hands often with soap and water for at least 20 seconds. If you cannot use soap and water, use hand sanitizer. Doing this lowers your chance of getting a skin infection. Your infection can cause chickenpox in people who have never had chickenpox or never got a chickenpox vaccine shot. If you have blisters that did not change into  scabs yet, try not to touch other people or be around other people,  especially: Babies. Pregnant women. Children who have areas of red, itchy, or rough skin (eczema). Older people who have organ transplants. People who have a long-term (chronic) illness, like cancer or AIDS. Keep all follow-up visits. How is this prevented? A vaccine shot is the best way to prevent shingles and protect against shingles problems. If you have not had a vaccine shot, talk with your doctor about getting it. Where to find more information Centers for Disease Control and Prevention: http://www.wolf.info/ Contact a doctor if: Your pain does not get better with medicine. Your pain does not get better after the rash heals. You have any of these signs of infection around the rash: More redness, swelling, or pain. Fluid or blood. Warmth. Pus or a bad smell. You have a fever. Get help right away if: The rash is on your face or nose. You have pain in your face or pain by your eye. You lose feeling on one side of your face. You have trouble seeing. You have ear pain, or you have ringing in your ear. You have a loss of taste. Your condition gets worse. Summary Shingles gives you a painful skin rash and blisters that have fluid in them. Shingles is caused by the same germ (virus) that causes chickenpox. Keep your rash covered with a loose bandage. Wear loose clothing that does not rub on your rash. If you have blisters that did not change into scabs yet, try not to touch other people or be around people. This information is not intended to replace advice given to you by your health care provider. Make sure you discuss any questions you have with your health care provider. Document Revised: 05/22/2020 Document Reviewed: 05/22/2020 Elsevier Patient Education  [place].

## 2022-07-16 NOTE — Addendum Note (Signed)
Addended by: Juluis Mire on: 07/16/2022 11:27 AM   Modules accepted: Orders

## 2022-07-22 ENCOUNTER — Ambulatory Visit (HOSPITAL_BASED_OUTPATIENT_CLINIC_OR_DEPARTMENT_OTHER)
Admission: RE | Admit: 2022-07-22 | Discharge: 2022-07-22 | Disposition: A | Discharge status: Home / Self Care | Payer: No Typology Code available for payment source | Source: Ambulatory Visit | Attending: Primary Care | Admitting: Primary Care

## 2022-07-22 ENCOUNTER — Ambulatory Visit (HOSPITAL_COMMUNITY)
Admission: RE | Admit: 2022-07-22 | Discharge: 2022-07-22 | Disposition: A | Discharge status: Home / Self Care | Payer: No Typology Code available for payment source | Source: Ambulatory Visit | Attending: Primary Care | Admitting: Primary Care

## 2022-07-22 DIAGNOSIS — G629 Polyneuropathy, unspecified: Secondary | ICD-10-CM

## 2022-07-22 DIAGNOSIS — R202 Paresthesia of skin: Secondary | ICD-10-CM | POA: Insufficient documentation

## 2022-07-23 LAB — VAS US ABI WITH/WO TBI
Left ABI: 0.68
Right ABI: 0.73

## 2022-07-24 NOTE — H&P (View-Only) (Signed)
Please schedule patient to be seen by me in 1-2 weeks please

## 2022-07-24 NOTE — Progress Notes (Signed)
Please schedule patient to be seen by me in 1-2 weeks please

## 2022-07-26 ENCOUNTER — Ambulatory Visit (INDEPENDENT_AMBULATORY_CARE_PROVIDER_SITE_OTHER): Payer: Medicaid Other

## 2022-07-26 DIAGNOSIS — Z23 Encounter for immunization: Secondary | ICD-10-CM

## 2022-08-06 ENCOUNTER — Encounter: Payer: Self-pay | Admitting: Cardiology

## 2022-08-06 ENCOUNTER — Ambulatory Visit: Payer: Medicaid Other | Admitting: Cardiology

## 2022-08-06 VITALS — BP 114/67 | HR 70 | Resp 18 | Ht 68.0 in | Wt 285.8 lb

## 2022-08-06 DIAGNOSIS — E673 Hypervitaminosis D: Secondary | ICD-10-CM

## 2022-08-06 DIAGNOSIS — F17200 Nicotine dependence, unspecified, uncomplicated: Secondary | ICD-10-CM

## 2022-08-06 DIAGNOSIS — I10 Essential (primary) hypertension: Secondary | ICD-10-CM

## 2022-08-06 DIAGNOSIS — E7800 Pure hypercholesterolemia, unspecified: Secondary | ICD-10-CM

## 2022-08-06 DIAGNOSIS — I739 Peripheral vascular disease, unspecified: Secondary | ICD-10-CM

## 2022-08-06 MED ORDER — CILOSTAZOL 100 MG PO TABS: 100.0000 mg | ORAL_TABLET | Freq: Two times a day (BID) | ORAL | 2 refills | Status: DC

## 2022-08-06 MED ORDER — BUPROPION HCL ER (SR) 150 MG PO TB12: 150.0000 mg | ORAL_TABLET | Freq: Two times a day (BID) | ORAL | 2 refills | Status: DC

## 2022-08-06 MED ORDER — NICOTINE 21 MG/24HR TD PT24: 21.0000 mg | MEDICATED_PATCH | Freq: Every day | TRANSDERMAL | 1 refills | Status: DC

## 2022-08-06 MED ORDER — ASPIRIN 81 MG PO CHEW: 81.0000 mg | CHEWABLE_TABLET | Freq: Every day | ORAL | 1 refills | Status: AC

## 2022-08-06 NOTE — Progress Notes (Signed)
Primary Physician/Referring:  Kerin Perna, NP  Patient ID: Sharon Dyer, female    DOB: 30-Apr-1966, 57 y.o.   MRN: [id number]  Chief Complaint  Patient presents with   PAD   New Patient (Initial Visit)   HPI:    Sharon Dyer  is a 56 y.o. African-American female patient with hypertension, hyperlipidemia, tobacco use disorder, obesity referred to me for evaluation of peripheral arterial disease.  Patient states that she is unable to do even minimal activities around the house without having to stop frequently due to severe pain in her legs, both legs hurt equally bad.  She has not noticed any ulcerations.  In view of markedly reduced activity, she has not been able to do her job, she works delivering food by [employer] - but has not been working the hours that she would like in view of claudication.  Past Medical History:  Diagnosis Date   Anxiety    Arthritis    Patient verbalizes she has athritis to right ankle   Dyspnea    Frozen shoulder syndrome 07/30/2019   Hypertension    Obesity    Past Surgical History:  Procedure Laterality Date   ECTOPIC PREGNANCY SURGERY  X 2   Family History  Problem Relation Age of Onset   Breast cancer Mother        in 17s   Hypertension Mother    Stroke Mother     Social History   Tobacco Use   Smoking status: Every Day    Packs/day: 1.00    Years: 37.00    Total pack years: 37.00    Types: Cigarettes    Start date: 1983   Smokeless tobacco: Never   Tobacco comments:    Started on Wellbutrin for depression  Substance Use Topics   Alcohol use: Not Currently    Comment: 12/22/2017 "maybe twice a year"   Marital Status: Divorced  ROS  Review of Systems  Cardiovascular:  Positive for claudication (bilateral calves). Negative for chest pain, dyspnea on exertion and leg swelling.   Objective      08/06/2022    9:27 AM 08/06/2022    9:14 AM 07/16/2022   10:17 AM  Vitals with BMI  Height  '5\' 8"'$    Weight  285 lbs 13 oz  285 lbs  BMI  [id number]   Systolic [id number]  Diastolic 67 58 82  Pulse  70 62   SpO2: 95 %  Physical Exam Constitutional:      Appearance: She is obese.  Neck:     Vascular: No carotid bruit or JVD.  Cardiovascular:     Rate and Rhythm: Normal rate and regular rhythm.     Pulses: Intact distal pulses.          Femoral pulses are 1+ on the right side and 1+ on the left side.      Popliteal pulses are 0 on the right side and 0 on the left side.       Dorsalis pedis pulses are 0 on the right side and 0 on the left side.       Posterior tibial pulses are 0 on the right side and 0 on the left side.     Heart sounds: Normal heart sounds. No murmur heard.    No gallop.  Pulmonary:     Effort: Pulmonary effort is normal.     Breath sounds: Normal breath sounds.  Abdominal:     General:  Bowel sounds are normal.     Palpations: Abdomen is soft.  Musculoskeletal:     Right lower leg: No edema.     Left lower leg: No edema.     Medications and allergies  No Known Allergies   Medication list   Current Outpatient Medications:    acetaminophen (TYLENOL) 500 MG tablet, Take 500 mg by mouth every 6 (six) hours as needed for moderate pain., Disp: , Rfl:    amLODipine (NORVASC) 10 MG tablet, TAKE ONE TABLET BY MOUTH DAILY, Disp: 90 tablet, Rfl: 1   aspirin (ASPIRIN CHILDRENS) 81 MG chewable tablet, Chew 1 tablet (81 mg total) by mouth daily., Disp: 90 tablet, Rfl: 1   atorvastatin (LIPITOR) 20 MG tablet, Take 1 tablet (20 mg total) by mouth daily., Disp: 90 tablet, Rfl: 1   buPROPion (WELLBUTRIN SR) 150 MG 12 hr tablet, Take 1 tablet (150 mg total) by mouth 2 (two) times daily. Morning and late afternoon, Disp: 60 tablet, Rfl: 2   busPIRone (BUSPAR) 5 MG tablet, Take 1 tablet (5 mg total) by mouth 2 (two) times daily., Disp: 60 tablet, Rfl: 2   cilostazol (PLETAL) 100 MG tablet, Take 1 tablet (100 mg total) by mouth 2 (two) times daily., Disp: 60 tablet, Rfl: 2   gabapentin (NEURONTIN) 100  MG capsule, Take 1 capsule (100 mg total) by mouth 3 (three) times daily., Disp: 270 capsule, Rfl: 1   losartan-hydrochlorothiazide (HYZAAR) 100-25 MG tablet, Take 1 tablet by mouth daily., Disp: 90 tablet, Rfl: 1   nicotine (NICODERM CQ) 21 mg/24hr patch, Place 1 patch (21 mg total) onto the skin daily., Disp: 28 patch, Rfl: 1 Laboratory examination:   Recent Labs    08/11/21 0520 10/12/21 2249 04/15/22 1052  NA 139 143 141  K 3.2* 3.2* CANCELED  CL 104 106 102  CO2 '26 27 23  '$ GLUCOSE 133* 115* 91  BUN '19 18 14  '$ CREATININE 0.90 1.10* 0.84  CALCIUM 8.8* 9.3 9.5  GFRNONAA >60 59*  --     Lab Results  Component Value Date   GLUCOSE 91 04/15/2022   NA 141 04/15/2022   K CANCELED 04/15/2022   CL 102 04/15/2022   CO2 23 04/15/2022   BUN 14 04/15/2022   CREATININE 0.84 04/15/2022   EGFR 80 04/15/2022   CALCIUM 9.5 04/15/2022   PROT 7.2 04/15/2022   ALBUMIN 4.1 04/15/2022   LABGLOB 3.1 04/15/2022   AGRATIO 1.3 04/15/2022   BILITOT 0.3 04/15/2022   ALKPHOS 72 04/15/2022   AST 23 04/15/2022   ALT 13 04/15/2022   ANIONGAP 10 10/12/2021      Lab Results  Component Value Date   ALT 13 04/15/2022   AST 23 04/15/2022   ALKPHOS 72 04/15/2022   BILITOT 0.3 04/15/2022       Latest Ref Rng & Units 04/15/2022   10:52 AM 10/12/2021   10:49 PM 08/11/2021    5:20 AM  Hepatic Function  Total Protein 6.0 - 8.5 g/dL 7.2  7.0  6.7   Albumin 3.8 - 4.9 g/dL 4.1  3.7  3.4   AST 0 - 40 IU/L '23  17  15   '$ ALT 0 - 32 IU/L '13  11  11   '$ Alk Phosphatase 44 - 121 IU/L 72  56  51   Total Bilirubin 0.0 - 1.2 mg/dL 0.3  0.6  0.3     Lipid Panel Recent Labs    04/15/22 1052  CHOL 185  TRIG 99  LDLCALC 126*  HDL 41  CHOLHDL 4.5*    HEMOGLOBIN A1C Lab Results  Component Value Date   HGBA1C 5.9 (A) 02/05/2022   Lab Results  Component Value Date   TSH 1.240 02/05/2019    Last vitamin D No results found for: "25OHVITD2", "25OHVITD3", "VD25OH"   Radiology:   Chest x-ray two-view  01/21/2019: The heart size and mediastinal contours are within normal limits. Both lungs are clear. Mild degenerative changes of the spine.  Ultrasound of the abdomen 08/11/2021: 1. Study is positive for cholelithiasis. No findings to suggest an acute cholecystitis at this time. 2.  Hepatic steatosis  Cardiac Studies:   Echocardiogram 07/25/2022:  12/23/2017  Left ventricle: The cavity size was normal. Wall thickness was   increased in a pattern of mild LVH. There was mild focal basal   hypertrophy of the septum. Systolic function was normal. The   estimated ejection fraction was in the range of 55% to 60%. Wall   motion was normal; there were no regional wall motion  abnormalities. Doppler parameters are consistent with abnormal   left ventricular relaxation (grade 1 diastolic dysfunction).   Doppler parameters are consistent with high ventricular filling pressure. - Aortic valve: There was mild regurgitation. - Right ventricle: The cavity size was mildly dilated. - Pulmonary arteries: Systolic pressure was moderately to severely  increased. PA peak pressure: 58 mm Hg (S).  Lower Extremity Arterial Duplex 07/23/2022: Right: 75-99% stenosis noted in the superficial femoral artery.  Left: Total occlusion noted in the superficial femoral artery.  ABI: Right 0.73. TBI 0.52 left 0.68. TBI 0.52  EKG:   EKG 08/06/2022: Sinus rhythm with borderline first-degree AV block at the rate of 66 bpm, normal axis, incomplete right bundle branch block.  Poor R progression, cannot exclude anteroseptal infarct old.  EKG 08/06/2019: Normal sinus rhythm at rate of 89 bpm, low voltage complexes.  Assessment     ICD-10-CM   1. Claudication in peripheral vascular disease (HCC)  I73.9 EKG 12-Lead    aspirin (ASPIRIN CHILDRENS) 81 MG chewable tablet    cilostazol (PLETAL) 100 MG tablet    nicotine (NICODERM CQ) 21 mg/24hr patch    PCV MYOCARDIAL PERFUSION WITH LEXISCAN    CBC    Basic metabolic panel     2. Primary hypertension  I10     3. Hypercholesteremia  E78.00 PCV MYOCARDIAL PERFUSION WITH LEXISCAN    4. Tobacco use disorder  F17.200 nicotine (NICODERM CQ) 21 mg/24hr patch    buPROPion (WELLBUTRIN SR) 150 MG 12 hr tablet    5. Hypervitaminosis D  E67.3 VITAMIN D 25 Hydroxy (Vit-D Deficiency, Fractures)       Orders Placed This Encounter  Procedures   VITAMIN D 25 Hydroxy (Vit-D Deficiency, Fractures)   CBC   Basic metabolic panel   PCV MYOCARDIAL PERFUSION WITH LEXISCAN    Standing Status:   Future    Standing Expiration Date:   10/05/2022   EKG 12-Lead   Meds ordered this encounter  Medications   aspirin (ASPIRIN CHILDRENS) 81 MG chewable tablet    Sig: Chew 1 tablet (81 mg total) by mouth daily.    Dispense:  90 tablet    Refill:  1   cilostazol (PLETAL) 100 MG tablet    Sig: Take 1 tablet (100 mg total) by mouth 2 (two) times daily.    Dispense:  60 tablet    Refill:  2   nicotine (NICODERM CQ) 21 mg/24hr patch    Sig: Place  1 patch (21 mg total) onto the skin daily.    Dispense:  28 patch    Refill:  1   buPROPion (WELLBUTRIN SR) 150 MG 12 hr tablet    Sig: Take 1 tablet (150 mg total) by mouth 2 (two) times daily. Morning and late afternoon    Dispense:  60 tablet    Refill:  2   Medications Discontinued During This Encounter  Medication Reason   buPROPion (WELLBUTRIN SR) 150 MG 12 hr tablet Change in therapy   potassium chloride SA (KLOR-CON M) 20 MEQ tablet Prescription never filled   ibuprofen (ADVIL) 800 MG tablet Patient Preference   Naproxen Sodium (ALEVE) 220 MG CAPS Discontinued by provider     Recommendations:   Sharon Dyer is a 57 y.o. African-American female patient with hypertension, hyperlipidemia, tobacco use disorder, obesity referred to me for evaluation of peripheral arterial disease.  1. Claudication in peripheral vascular disease ([place]) Patient unable to do even activities of daily living without having to stop frequently.   Fortunately there is no limb threatening ischemia.  I reviewed her external labs and also duplex that was performed, she needs peripheral arteriogram.  She also needs risk factor modification.  I will start her on aspirin 81 mg daily, Pletal 100 mg p.o. twice daily, schedule her for a Lexiscan nuclear stress test to exclude significant coronary artery disease in a patient with multiple cardiovascular risks and PAD, she is a high risk individual.   Will schedule for peripheral arteriogram and possible angioplasty given symptoms. Patient understands the risks, benefits, alternatives including medical therapy, CT angiography. Patient understands <1-2% risk of death, embolic complications, bleeding, infection, renal failure, urgent surgical revascularization, but not limited to these and wants to proceed.   2. Primary hypertension Blood pressure is well-controlled presently on losartan and amlodipine, continue the same.  3. Hypercholesteremia Patient was recently started on atorvastatin, she will need follow-up lipids, we also need to check LPA at some point.  4. Tobacco use disorder Smoking cessation discussed in detail.  She appears to be motivated.  She and her boyfriend both smoke in the house.  She will stop smoking in the house.  Wellbutrin will be retried, she had previously tried this and that helped her with weight loss as well.  I will also send her for nicotine patches.  5. Hypervitaminosis D In view of arthritis, vitamin D deficiency being fairly common, I would like to check vitamin D levels along with pre-PV angiogram catheterization labs.     Adrian Prows, MD, [facility] 08/06/2022, 10:40 AM Office: [phone]

## 2022-08-07 ENCOUNTER — Encounter (INDEPENDENT_AMBULATORY_CARE_PROVIDER_SITE_OTHER): Payer: Self-pay | Admitting: Primary Care

## 2022-08-07 LAB — BASIC METABOLIC PANEL
BUN/Creatinine Ratio: 16 (ref 9–23)
BUN: 14 mg/dL (ref 6–24)
CO2: 23 mmol/L (ref 20–29)
Calcium: 9.2 mg/dL (ref 8.7–10.2)
Chloride: 103 mmol/L (ref 96–106)
Creatinine, Ser: 0.87 mg/dL (ref 0.57–1.00)
Glucose: 93 mg/dL (ref 70–99)
Potassium: 4 mmol/L (ref 3.5–5.2)
Sodium: 142 mmol/L (ref 134–144)
eGFR: 77 mL/min/{1.73_m2} (ref >59)

## 2022-08-07 LAB — CBC
Hematocrit: 41.8 % (ref 34.0–46.6)
Hemoglobin: 14 g/dL (ref 11.1–15.9)
MCH: 29.5 pg (ref 26.6–33.0)
MCHC: 33.5 g/dL (ref 31.5–35.7)
MCV: 88 fL (ref 79–97)
Platelets: 216 10*3/uL (ref 150–450)
RBC: 4.74 x10E6/uL (ref 3.77–5.28)
RDW: 13.4 % (ref 11.7–15.4)
WBC: 10.7 10*3/uL (ref 3.4–10.8)

## 2022-08-07 LAB — VITAMIN D 25 HYDROXY (VIT D DEFICIENCY, FRACTURES): Vit D, 25-Hydroxy: 5.4 ng/mL — ABNORMAL LOW (ref 30.0–100.0)

## 2022-08-07 NOTE — Progress Notes (Signed)
Sharon Dyer, please manage Vit D deficiency and may need 50000 units for 12 weeks Then 5000 daily and recheck in 6 months

## 2022-08-08 ENCOUNTER — Other Ambulatory Visit (INDEPENDENT_AMBULATORY_CARE_PROVIDER_SITE_OTHER): Payer: Self-pay | Admitting: Primary Care

## 2022-08-08 MED ORDER — VITAMIN D (ERGOCALCIFEROL) 1.25 MG (50000 UNIT) PO CAPS: 50000.0000 [IU] | ORAL_CAPSULE | ORAL | 0 refills | Status: DC

## 2022-08-13 ENCOUNTER — Ambulatory Visit: Payer: Medicaid Other

## 2022-08-13 ENCOUNTER — Other Ambulatory Visit: Payer: Self-pay

## 2022-08-13 ENCOUNTER — Encounter (HOSPITAL_COMMUNITY): Payer: Self-pay

## 2022-08-13 ENCOUNTER — Emergency Department (HOSPITAL_COMMUNITY): Payer: No Typology Code available for payment source

## 2022-08-13 ENCOUNTER — Emergency Department (HOSPITAL_COMMUNITY)
Admission: EM | Admit: 2022-08-13 | Discharge: 2022-08-13 | Disposition: A | Discharge status: Home / Self Care | Payer: No Typology Code available for payment source | Attending: Emergency Medicine | Admitting: Emergency Medicine

## 2022-08-13 DIAGNOSIS — E7800 Pure hypercholesterolemia, unspecified: Secondary | ICD-10-CM

## 2022-08-13 DIAGNOSIS — I10 Essential (primary) hypertension: Secondary | ICD-10-CM | POA: Insufficient documentation

## 2022-08-13 DIAGNOSIS — Z79899 Other long term (current) drug therapy: Secondary | ICD-10-CM | POA: Insufficient documentation

## 2022-08-13 DIAGNOSIS — E860 Dehydration: Secondary | ICD-10-CM | POA: Diagnosis not present

## 2022-08-13 DIAGNOSIS — K8020 Calculus of gallbladder without cholecystitis without obstruction: Secondary | ICD-10-CM | POA: Diagnosis not present

## 2022-08-13 DIAGNOSIS — D72829 Elevated white blood cell count, unspecified: Secondary | ICD-10-CM | POA: Insufficient documentation

## 2022-08-13 DIAGNOSIS — R1010 Upper abdominal pain, unspecified: Secondary | ICD-10-CM | POA: Diagnosis present

## 2022-08-13 DIAGNOSIS — I739 Peripheral vascular disease, unspecified: Secondary | ICD-10-CM

## 2022-08-13 LAB — CBC WITH DIFFERENTIAL/PLATELET
Abs Immature Granulocytes: 0.09 10*3/uL — ABNORMAL HIGH (ref 0.00–0.07)
Basophils Absolute: 0.1 10*3/uL (ref 0.0–0.1)
Basophils Relative: 0 %
Eosinophils Absolute: 0.4 10*3/uL (ref 0.0–0.5)
Eosinophils Relative: 3 %
HCT: 37.5 % (ref 36.0–46.0)
Hemoglobin: 12.7 g/dL (ref 12.0–15.0)
Immature Granulocytes: 1 %
Lymphocytes Relative: 21 %
Lymphs Abs: 2.6 10*3/uL (ref 0.7–4.0)
MCH: 30.8 pg (ref 26.0–34.0)
MCHC: 33.9 g/dL (ref 30.0–36.0)
MCV: 90.8 fL (ref 80.0–100.0)
Monocytes Absolute: 1 10*3/uL (ref 0.1–1.0)
Monocytes Relative: 8 %
Neutro Abs: 8.6 10*3/uL — ABNORMAL HIGH (ref 1.7–7.7)
Neutrophils Relative %: 67 %
Platelets: 194 10*3/uL (ref 150–400)
RBC: 4.13 MIL/uL (ref 3.87–5.11)
RDW: 14 % (ref 11.5–15.5)
WBC: 12.7 10*3/uL — ABNORMAL HIGH (ref 4.0–10.5)
nRBC: 0 % (ref 0.0–0.2)

## 2022-08-13 LAB — COMPREHENSIVE METABOLIC PANEL
ALT: 25 U/L (ref 0–44)
AST: 39 U/L (ref 15–41)
Albumin: 3.2 g/dL — ABNORMAL LOW (ref 3.5–5.0)
Alkaline Phosphatase: 61 U/L (ref 38–126)
Anion gap: 10 (ref 5–15)
BUN: 15 mg/dL (ref 6–20)
CO2: 26 mmol/L (ref 22–32)
Calcium: 9.2 mg/dL (ref 8.9–10.3)
Chloride: 103 mmol/L (ref 98–111)
Creatinine, Ser: 1.32 mg/dL — ABNORMAL HIGH (ref 0.44–1.00)
GFR, Estimated: 47 mL/min — ABNORMAL LOW (ref >60)
Glucose, Bld: 124 mg/dL — ABNORMAL HIGH (ref 70–99)
Potassium: 3.5 mmol/L (ref 3.5–5.1)
Sodium: 139 mmol/L (ref 135–145)
Total Bilirubin: 0.3 mg/dL (ref 0.3–1.2)
Total Protein: 6.6 g/dL (ref 6.5–8.1)

## 2022-08-13 LAB — LIPASE, BLOOD: Lipase: 35 U/L (ref 11–51)

## 2022-08-13 MED ORDER — ONDANSETRON HCL 4 MG/2ML IJ SOLN
4.0000 mg | Freq: Once | INTRAMUSCULAR | Status: AC
  Administered 2022-08-13: 4 mg via INTRAVENOUS
  Filled 2022-08-13: qty 2

## 2022-08-13 MED ORDER — FENTANYL CITRATE PF 50 MCG/ML IJ SOSY
100.0000 ug | PREFILLED_SYRINGE | Freq: Once | INTRAMUSCULAR | Status: AC
  Administered 2022-08-13: 100 ug via INTRAVENOUS
  Filled 2022-08-13: qty 2

## 2022-08-13 MED ORDER — HYDROCODONE-ACETAMINOPHEN 5-325 MG PO TABS: 1.0000 | ORAL_TABLET | Freq: Four times a day (QID) | ORAL | 0 refills | Status: DC | PRN

## 2022-08-13 MED ORDER — ONDANSETRON 8 MG PO TBDP: 8.0000 mg | ORAL_TABLET | Freq: Three times a day (TID) | ORAL | 0 refills | Status: DC | PRN

## 2022-08-13 NOTE — ED Triage Notes (Signed)
Mid upper abdominal pain beginning around 1am today.   2 episodes of vomiting.   Hx of gallstones but says it usually feels different.   Has not eaten recently as she's scheduled for stress test this morning.

## 2022-08-13 NOTE — Progress Notes (Signed)
PIV consult: Arrived to ED, pt reports she received her medication, has PIV. IV consult canceled.

## 2022-08-13 NOTE — ED Provider Notes (Signed)
[facility] Provider Note   CSN: [id number] Arrival date & time: 08/13/22  0309     History  Chief Complaint  Patient presents with   Abdominal Pain    Sharon Dyer is a 57 y.o. female.  The history is provided by the patient and the spouse.  Patient with history of anxiety, peripheral vascular disease, hypertension presents with abdominal pain.  Patient reports that she had significant upper abdominal pain that woke her up around 1 AM.  She reports 2 episodes of nonbloody emesis.  No diarrhea, reports recent constipation, last bowel movement 1 day ago.  No active chest pain or shortness of breath.  She reports previous history of gallstones, but this pain feels more severe.  No recent NSAID use.  Denies any alcohol use.    Past Medical History:  Diagnosis Date   Anxiety    Arthritis    Patient verbalizes she has athritis to right ankle   Dyspnea    Frozen shoulder syndrome 07/30/2019   Hypertension    Obesity     Home Medications Prior to Admission medications   Medication Sig Start Date End Date Taking? Authorizing Provider  HYDROcodone-acetaminophen (NORCO/VICODIN) 5-325 MG tablet Take 1 tablet by mouth every 6 (six) hours as needed for severe pain. 08/13/22  Yes Ripley Fraise, MD  ondansetron (ZOFRAN-ODT) 8 MG disintegrating tablet Take 1 tablet (8 mg total) by mouth every 8 (eight) hours as needed. 08/13/22  Yes Ripley Fraise, MD  acetaminophen (TYLENOL) 500 MG tablet Take 500 mg by mouth every 6 (six) hours as needed for moderate pain.    [provider]  amLODipine (NORVASC) 10 MG tablet TAKE ONE TABLET BY MOUTH DAILY 06/06/22   Kerin Perna, NP  aspirin (ASPIRIN CHILDRENS) 81 MG chewable tablet Chew 1 tablet (81 mg total) by mouth daily. 08/06/22   Adrian Prows, MD  atorvastatin (LIPITOR) 20 MG tablet Take 1 tablet (20 mg total) by mouth daily. 06/06/22   Kerin Perna, NP  buPROPion (WELLBUTRIN SR)  150 MG 12 hr tablet Take 1 tablet (150 mg total) by mouth 2 (two) times daily. Morning and late afternoon 08/06/22   Adrian Prows, MD  busPIRone (BUSPAR) 5 MG tablet Take 1 tablet (5 mg total) by mouth 2 (two) times daily. 07/16/22   Kerin Perna, NP  cilostazol (PLETAL) 100 MG tablet Take 1 tablet (100 mg total) by mouth 2 (two) times daily. 08/06/22   Adrian Prows, MD  gabapentin (NEURONTIN) 100 MG capsule Take 1 capsule (100 mg total) by mouth 3 (three) times daily. 07/16/22   Kerin Perna, NP  losartan-hydrochlorothiazide (HYZAAR) 100-25 MG tablet Take 1 tablet by mouth daily. 06/06/22   Kerin Perna, NP  nicotine (NICODERM CQ) 21 mg/24hr patch Place 1 patch (21 mg total) onto the skin daily. 08/06/22   Adrian Prows, MD  Vitamin D, Ergocalciferol, (DRISDOL) 1.25 MG (50000 UNIT) CAPS capsule Take 1 capsule (50,000 Units total) by mouth every 7 (seven) days. 08/08/22   Kerin Perna, NP      Allergies    Patient has no known allergies.    Review of Systems   Review of Systems  Cardiovascular:  Negative for chest pain.  Gastrointestinal:  Positive for abdominal pain, nausea and vomiting.    Physical Exam Updated Vital Signs BP (!) 113/59   Pulse 74   Temp 97.9 F (36.6 C) (Oral)   Resp 16   LMP  01/12/2015   SpO2 98%  Physical Exam CONSTITUTIONAL: Well developed/well nourished, uncomfortable appearing HEAD: Normocephalic/atraumatic EYES: EOMI/PERRL, no icterus ENMT: Mucous membranes moist NECK: supple no meningeal signs CV: S1/S2 noted, no murmurs/rubs/gallops noted LUNGS: Lungs are clear to auscultation bilaterally, no apparent distress ABDOMEN: soft, moderate epigastric tenderness, no rebound or guarding, bowel sounds noted throughout abdomen NEURO: Pt is awake/alert/appropriate, moves all extremitiesx4.  No facial droop.   SKIN: warm, color normal  ED Results / Procedures / Treatments   Labs (all labs ordered are listed, but only abnormal results are  displayed) Labs Reviewed  COMPREHENSIVE METABOLIC PANEL - Abnormal; Notable for the following components:      Result Value   Glucose, Bld 124 (*)    Creatinine, Ser 1.32 (*)    Albumin 3.2 (*)    GFR, Estimated 47 (*)    All other components within normal limits  CBC WITH DIFFERENTIAL/PLATELET - Abnormal; Notable for the following components:   WBC 12.7 (*)    Neutro Abs 8.6 (*)    Abs Immature Granulocytes 0.09 (*)    All other components within normal limits  LIPASE, BLOOD    EKG EKG Interpretation  Date/Time:  Tuesday August 13 2022 03:45:32 EST Ventricular Rate:  82 PR Interval:  180 QRS Duration: 87 QT Interval:  396 QTC Calculation: 463 R Axis:   1 Text Interpretation: Sinus rhythm Low voltage, precordial leads Confirmed by Ripley Fraise [phone]) on 08/13/2022 3:49:46 AM  Radiology US Abdomen Limited RUQ (LIVER/GB)  Result Date: 08/13/2022 CLINICAL DATA:  57 year old female presenting with history of right upper quadrant abdominal pain. EXAM: ULTRASOUND ABDOMEN LIMITED RIGHT UPPER QUADRANT COMPARISON:  Right upper quadrant abdominal ultrasound 08/11/2021. FINDINGS: Gallbladder: Echogenic foci with posterior acoustic shadowing measuring up to 1.9 cm in diameter in the neck of the gallbladder. Gallbladder is not distended. Gallbladder wall thickness is normal at 2.9 mm. No pericholecystic fluid. Per report from the sonographer, there was no sonographic Murphy's sign on examination. Common bile duct: Diameter: 6 mm Liver: No focal lesion identified. Mild diffuse increased echogenicity throughout the hepatic parenchyma. Portal vein is patent on color Doppler imaging with normal direction of blood flow towards the liver. Other: None. IMPRESSION: 1. Study is positive for cholelithiasis, but there are no imaging findings to suggest an acute cholecystitis at this time. 2. Hepatic steatosis. Electronically Signed   By: Vinnie Langton M.D.   On: 08/13/2022 05:53     Procedures Procedures    Medications Ordered in ED Medications  fentaNYL (SUBLIMAZE) injection 100 mcg (100 mcg Intravenous Given 08/13/22 0453)  ondansetron (ZOFRAN) injection 4 mg (4 mg Intravenous Given 08/13/22 0453)    ED Course/ Medical Decision Making/ A&P Clinical Course as of 08/13/22 0625  Tue Aug 13, 2022  0424 WBC(!): 12.7 Leukocytosis [DW]  0526 Pt reports feeling improved, [place] pending [DW]  [id number] Patient feels much improved.  She has no new pain.  No vomiting.  Mild dehydration with labs, and mild leukocytosis.  Ultrasound imaging confirms continued cholelithiasis without complicating features.  No signs of cholecystitis or choledocholithiasis.  Patient feels well for discharge home.  She denies any chest pain or shortness of breath.  No other abdominal pain is reported.  Low suspicion for other acute abdominal emergency.  Patient will be referred to general surgery.  We discussed return precautions [DW]    Clinical Course User Index [DW] Ripley Fraise, MD  Medical Decision Making Amount and/or Complexity of Data Reviewed Labs: ordered. Decision-making details documented in ED Course. Radiology: ordered. ECG/medicine tests: ordered.  Risk Prescription drug management.   This patient presents to the ED for concern of abdominal pain, this involves an extensive number of treatment options, and is a complaint that carries with it a high risk of complications and morbidity.  The differential diagnosis includes but is not limited to cholecystitis, cholelithiasis, pancreatitis, gastritis, peptic ulcer disease, appendicitis, bowel obstruction, bowel perforation, diverticulitis, AAA, ischemic bowel    Comorbidities that complicate the patient evaluation: Patient's presentation is complicated by their history of obesity hypertension  Additional history obtained: Records reviewed  cardiology records reviewed  Lab Tests: I Ordered, and  personally interpreted labs.  The pertinent results include: Leukocytosis  Imaging Studies ordered: I ordered imaging studies including Ultrasound RUQ   I independently visualized and interpreted imaging which showed cholelithiasis I agree with the radiologist interpretation  Cardiac Monitoring: The patient was maintained on a cardiac monitor.  I personally viewed and interpreted the cardiac monitor which showed an underlying rhythm of:  sinus rhythm  Medicines ordered and prescription drug management: I ordered medication including fentanyl  for pain  Reevaluation of the patient after these medicines showed that the patient    improved  Reevaluation: After the interventions noted above, I reevaluated the patient and found that they have :improved  Complexity of problems addressed: Patient's presentation is most consistent with  acute presentation with potential threat to life or bodily function  Disposition: After consideration of the diagnostic results and the patient's response to treatment,  I feel that the patent would benefit from discharge   .           Final Clinical Impression(s) / ED Diagnoses Final diagnoses:  Calculus of gallbladder without cholecystitis without obstruction    Rx / DC Orders ED Discharge Orders          Ordered    HYDROcodone-acetaminophen (NORCO/VICODIN) 5-325 MG tablet  Every 6 hours PRN        08/13/22 0618    ondansetron (ZOFRAN-ODT) 8 MG disintegrating tablet  Every 8 hours PRN        08/13/22 [id number]              Ripley Fraise, MD 08/13/22 [phone]

## 2022-08-13 NOTE — ED Notes (Signed)
Patient is resting in bed. Husband no longer at bedside.

## 2022-08-13 NOTE — Discharge Instructions (Addendum)
You can use the pain medicine as needed for any abdominal pain.  Follow-up with the general surgeon next week after you have your cardiac evaluation.

## 2022-08-14 NOTE — Progress Notes (Signed)
Lexiscan Tetrofosmin stress test 08/13/2022: Lexiscan nuclear stress test performed using 1-day protocol. SPECT images showed small sized, mild intensity, reversible perfusion defect in basal inferior myocardium.  Normal myocardial wall motion and thickening. Stress LVEF 60%. Low risk study.

## 2022-08-16 ENCOUNTER — Other Ambulatory Visit: Payer: Self-pay

## 2022-08-16 ENCOUNTER — Encounter (HOSPITAL_COMMUNITY)
Admission: RE | Disposition: A | Discharge status: Home / Self Care | Payer: Self-pay | Source: Ambulatory Visit | Attending: Cardiology

## 2022-08-16 ENCOUNTER — Encounter (HOSPITAL_COMMUNITY): Payer: Self-pay | Admitting: Cardiology

## 2022-08-16 ENCOUNTER — Ambulatory Visit (HOSPITAL_COMMUNITY)
Admission: RE | Admit: 2022-08-16 | Discharge: 2022-08-16 | Disposition: A | Discharge status: Home / Self Care | Payer: No Typology Code available for payment source | Source: Ambulatory Visit | Attending: Cardiology | Admitting: Cardiology

## 2022-08-16 ENCOUNTER — Other Ambulatory Visit (HOSPITAL_COMMUNITY): Payer: Self-pay

## 2022-08-16 DIAGNOSIS — I700 Atherosclerosis of aorta: Secondary | ICD-10-CM | POA: Insufficient documentation

## 2022-08-16 DIAGNOSIS — F17200 Nicotine dependence, unspecified, uncomplicated: Secondary | ICD-10-CM | POA: Diagnosis not present

## 2022-08-16 DIAGNOSIS — I10 Essential (primary) hypertension: Secondary | ICD-10-CM | POA: Diagnosis not present

## 2022-08-16 DIAGNOSIS — I70213 Atherosclerosis of native arteries of extremities with intermittent claudication, bilateral legs: Secondary | ICD-10-CM | POA: Insufficient documentation

## 2022-08-16 DIAGNOSIS — E785 Hyperlipidemia, unspecified: Secondary | ICD-10-CM | POA: Insufficient documentation

## 2022-08-16 DIAGNOSIS — I739 Peripheral vascular disease, unspecified: Secondary | ICD-10-CM

## 2022-08-16 HISTORY — PX: PERIPHERAL VASCULAR BALLOON ANGIOPLASTY: CATH118281

## 2022-08-16 HISTORY — PX: ABDOMINAL AORTOGRAM W/LOWER EXTREMITY: CATH118223

## 2022-08-16 HISTORY — PX: PERIPHERAL VASCULAR ATHERECTOMY: CATH118256

## 2022-08-16 HISTORY — DX: Peripheral vascular disease, unspecified: I73.9

## 2022-08-16 HISTORY — DX: Hyperlipidemia, unspecified: E78.5

## 2022-08-16 LAB — POCT ACTIVATED CLOTTING TIME
Activated Clotting Time: 234 seconds
Activated Clotting Time: 239 seconds

## 2022-08-16 SURGERY — ABDOMINAL AORTOGRAM W/LOWER EXTREMITY
Anesthesia: LOCAL | Laterality: Right

## 2022-08-16 MED ORDER — HEPARIN (PORCINE) IN NACL 1000-0.9 UT/500ML-% IV SOLN
INTRAVENOUS | Status: DC | PRN
  Administered 2022-08-16 (×2): 500 mL

## 2022-08-16 MED ORDER — SODIUM CHLORIDE 0.9% FLUSH: 3.0000 mL | INTRAVENOUS | Status: DC | PRN

## 2022-08-16 MED ORDER — IODIXANOL 320 MG/ML IV SOLN
INTRAVENOUS | Status: DC | PRN
  Administered 2022-08-16: 150 mL

## 2022-08-16 MED ORDER — HEPARIN SODIUM (PORCINE) 1000 UNIT/ML IJ SOLN
INTRAMUSCULAR | Status: AC
  Filled 2022-08-16: qty 10

## 2022-08-16 MED ORDER — LIDOCAINE HCL (PF) 1 % IJ SOLN
INTRAMUSCULAR | Status: DC | PRN
  Administered 2022-08-16: 10 mL

## 2022-08-16 MED ORDER — LIDOCAINE HCL (PF) 1 % IJ SOLN
INTRAMUSCULAR | Status: AC
  Filled 2022-08-16: qty 30

## 2022-08-16 MED ORDER — NITROGLYCERIN 1 MG/10 ML FOR IR/CATH LAB
INTRA_ARTERIAL | Status: DC | PRN
  Administered 2022-08-16: 500 ug
  Administered 2022-08-16: 400 ug

## 2022-08-16 MED ORDER — SODIUM CHLORIDE 0.9% FLUSH: 3.0000 mL | Freq: Two times a day (BID) | INTRAVENOUS | Status: DC

## 2022-08-16 MED ORDER — FENTANYL CITRATE (PF) 100 MCG/2ML IJ SOLN
INTRAMUSCULAR | Status: AC
  Filled 2022-08-16: qty 2

## 2022-08-16 MED ORDER — SODIUM CHLORIDE 0.9 % IV SOLN: INTRAVENOUS | Status: DC

## 2022-08-16 MED ORDER — SODIUM CHLORIDE 0.9 % IV SOLN: 250.0000 mL | INTRAVENOUS | Status: DC | PRN

## 2022-08-16 MED ORDER — NITROGLYCERIN 1 MG/10 ML FOR IR/CATH LAB
INTRA_ARTERIAL | Status: AC
  Filled 2022-08-16: qty 10

## 2022-08-16 MED ORDER — FENTANYL CITRATE (PF) 100 MCG/2ML IJ SOLN
INTRAMUSCULAR | Status: DC | PRN
  Administered 2022-08-16 (×4): 50 ug via INTRAVENOUS

## 2022-08-16 MED ORDER — HEPARIN SODIUM (PORCINE) 1000 UNIT/ML IJ SOLN
INTRAMUSCULAR | Status: DC | PRN
  Administered 2022-08-16: 8000 [IU] via INTRAVENOUS
  Administered 2022-08-16: 2000 [IU] via INTRAVENOUS
  Administered 2022-08-16: 3000 [IU] via INTRAVENOUS

## 2022-08-16 MED ORDER — CLOPIDOGREL BISULFATE 300 MG PO TABS
ORAL_TABLET | ORAL | Status: AC
  Filled 2022-08-16: qty 1

## 2022-08-16 MED ORDER — ACETAMINOPHEN 325 MG PO TABS
650.0000 mg | ORAL_TABLET | ORAL | Status: DC | PRN
  Administered 2022-08-16: 650 mg via ORAL
  Filled 2022-08-16: qty 2

## 2022-08-16 MED ORDER — FAMOTIDINE IN NACL 20-0.9 MG/50ML-% IV SOLN
INTRAVENOUS | Status: DC | PRN
  Administered 2022-08-16: 20 mg via INTRAVENOUS

## 2022-08-16 MED ORDER — CLOPIDOGREL BISULFATE 300 MG PO TABS
ORAL_TABLET | ORAL | Status: DC | PRN
  Administered 2022-08-16: 600 mg via ORAL

## 2022-08-16 MED ORDER — MIDAZOLAM HCL 2 MG/2ML IJ SOLN
INTRAMUSCULAR | Status: DC | PRN
  Administered 2022-08-16: 2 mg via INTRAVENOUS

## 2022-08-16 MED ORDER — ONDANSETRON HCL 4 MG/2ML IJ SOLN: 4.0000 mg | Freq: Four times a day (QID) | INTRAMUSCULAR | Status: DC | PRN

## 2022-08-16 MED ORDER — SODIUM CHLORIDE 0.9 % WEIGHT BASED INFUSION: 1.0000 mL/kg/h | INTRAVENOUS | Status: AC

## 2022-08-16 MED ORDER — CLOPIDOGREL BISULFATE 75 MG PO TABS
75.0000 mg | ORAL_TABLET | Freq: Every day | ORAL | 1 refills | Status: DC
  Filled 2022-08-16: qty 30, 30d supply, fill #0

## 2022-08-16 MED ORDER — SODIUM CHLORIDE 0.9 % IV BOLUS
500.0000 mL | Freq: Once | INTRAVENOUS | Status: AC
  Administered 2022-08-16: 500 mL via INTRAVENOUS

## 2022-08-16 MED ORDER — FAMOTIDINE IN NACL 20-0.9 MG/50ML-% IV SOLN
INTRAVENOUS | Status: AC
  Filled 2022-08-16: qty 50

## 2022-08-16 MED ORDER — MIDAZOLAM HCL 2 MG/2ML IJ SOLN
INTRAMUSCULAR | Status: AC
  Filled 2022-08-16: qty 2

## 2022-08-16 SURGICAL SUPPLY — 30 items
BALLN IN.PACT DCB 6X150 (BALLOONS) ×3
CATH HAWKONE LX EXTENDED TIP (CATHETERS) IMPLANT
CATH OMNI FLUSH 5F 65CM (CATHETERS) IMPLANT
CATH SOFT-VU 4F 65 STRAIGHT (CATHETERS) IMPLANT
CATH SOFT-VU ST 4F 90CM (CATHETERS) IMPLANT
CATH SOFT-VU STRAIGHT 4F 65CM (CATHETERS) ×3
CATH STRAIGHT 5FR 65CM (CATHETERS) IMPLANT
CLOSURE PERCLOSE PROSTYLE (VASCULAR PRODUCTS) IMPLANT
DCB IN.PACT 6X150 (BALLOONS) IMPLANT
DEVICE SPIDERFX EMB PROT 7MM (WIRE) IMPLANT
DEVICE TORQUE .025-.038 (MISCELLANEOUS) IMPLANT
GLIDEWIRE NITREX 0.018X80X5 (WIRE) ×3
GUIDEWIRE ANGLED .035X260CM (WIRE) IMPLANT
GUIDEWIRE NITREX 0.018X80X5 (WIRE) IMPLANT
KIT ENCORE 26 ADVANTAGE (KITS) IMPLANT
KIT MICROPUNCTURE NIT STIFF (SHEATH) IMPLANT
KIT PV (KITS) ×3 IMPLANT
PINNACLE LONG 5F 25CM (SHEATH) ×3
SHEATH CATAPULT 7FR 60 (SHEATH) IMPLANT
SHEATH INTROD PINNACLE 5F 25CM (SHEATH) IMPLANT
SHEATH PINNACLE 5F 10CM (SHEATH) IMPLANT
SHEATH PROBE COVER 6X72 (BAG) IMPLANT
STOPCOCK MORSE 400PSI 3WAY (MISCELLANEOUS) IMPLANT
SYR MEDRAD MARK 7 150ML (SYRINGE) ×3 IMPLANT
TAPE SHOOT N SEE (TAPE) IMPLANT
TRANSDUCER W/STOPCOCK (MISCELLANEOUS) ×3 IMPLANT
TRAY PV CATH (CUSTOM PROCEDURE TRAY) ×3 IMPLANT
TUBING CIL FLEX 10 FLL-RA (TUBING) IMPLANT
WIRE AMPLATZ SS-J .035X180CM (WIRE) IMPLANT
WIRE BENTSON .035X145CM (WIRE) IMPLANT

## 2022-08-16 NOTE — Interval H&P Note (Signed)
History and Physical Interval Note:  08/16/2022 7:54 AM  Sharon Dyer  has presented today for surgery, with the diagnosis of claudication - hp.  The various methods of treatment have been discussed with the patient and family. After consideration of risks, benefits and other options for treatment, the patient has consented to  Procedure(s): ABDOMINAL AORTOGRAM W/LOWER EXTREMITY (N/A) and possible angioplasty as a surgical intervention.  The patient's history has been reviewed, patient examined, no change in status, stable for surgery.  I have reviewed the patient's chart and labs.  Questions were answered to the patient's satisfaction.     Adrian Prows

## 2022-08-16 NOTE — Progress Notes (Signed)
Pt ambulated in hall without difficulty or bleeding.  VSS Pt and husband verbalize understanding  of discharge instructions and deny further questions.  Discharged with husband who will drive and stay with pt 24hrs

## 2022-08-16 NOTE — Discharge Instructions (Signed)
Femoral Site Care This sheet gives you information about how to care for yourself after your procedure. Your health care provider may also give you more specific instructions. If you have problems or questions, contact your health care provider. What can I expect after the procedure? After the procedure, it is common to have: Bruising that usually fades within 1-2 weeks. Tenderness at the site. Follow these instructions at home: Wound care May remove bandage after 24 hours. Do not take baths, swim, or use a hot tub for 5 days. You may shower 24-48 hours after the procedure. Gently wash the site with plain soap and water. Pat the area dry with a clean towel. Do not rub the site. This may cause bleeding. Do not apply powder or lotion to the site. Keep the site clean and dry. Check your femoral site every day for signs of infection. Check for: Redness, swelling, or pain. Fluid or blood. Warmth. Pus or a bad smell. Activity For the first 2-3 days after your procedure, or as long as directed: Avoid climbing stairs as much as possible. Do not squat. Do not lift, push or pull anything that is heavier than 10 lb for 5 days. Rest as directed. Avoid sitting for a long time without moving. Get up to take short walks every 1-2 hours. Do not drive for 24 hours. General instructions Take over-the-counter and prescription medicines only as told by your health care provider. Keep all follow-up visits as told by your health care provider. This is important. DRINK PLENTY OF FLUIDS FOR THE NEXT 2-3 DAYS. Contact a health care provider if you have: A fever or chills. You have redness, swelling, or pain around your insertion site. Get help right away if: The catheter insertion area swells very fast. You pass out. You suddenly start to sweat or your skin gets clammy. The catheter insertion area is bleeding, and the bleeding does not stop when you hold steady pressure on the area. The area near or  just beyond the catheter insertion site becomes pale, cool, tingly, or numb. These symptoms may represent a serious problem that is an emergency. Do not wait to see if the symptoms will go away. Get medical help right away. Call your local emergency services (911 in the [place] Do not drive yourself to the hospital. Summary After the procedure, it is common to have bruising that usually fades within 1-2 weeks. Check your femoral site every day for signs of infection. Do not lift, push or pull anything that is heavier than 10 lb for 5 days.  This information is not intended to replace advice given to you by your health care provider. Make sure you discuss any questions you have with your health care provider. Document Revised: 06/09/2017 Document Reviewed: 06/09/2017 Elsevier Patient Education  2020 Elsevier Inc.  

## 2022-08-19 ENCOUNTER — Encounter (HOSPITAL_COMMUNITY): Payer: Self-pay | Admitting: Cardiology

## 2022-08-20 ENCOUNTER — Encounter (HOSPITAL_COMMUNITY): Payer: Self-pay | Admitting: Cardiology

## 2022-08-21 ENCOUNTER — Encounter: Payer: Self-pay | Admitting: Cardiology

## 2022-08-22 ENCOUNTER — Encounter (INDEPENDENT_AMBULATORY_CARE_PROVIDER_SITE_OTHER): Payer: Self-pay | Admitting: Primary Care

## 2022-08-22 NOTE — Telephone Encounter (Signed)
From patient.

## 2022-08-26 ENCOUNTER — Telehealth: Payer: Self-pay

## 2022-08-26 DIAGNOSIS — I739 Peripheral vascular disease, unspecified: Secondary | ICD-10-CM

## 2022-08-26 NOTE — Telephone Encounter (Signed)
Referral made to  Managed Medicaid Care Management for assistance accessing additional benefits.

## 2022-08-30 ENCOUNTER — Encounter: Payer: Self-pay | Admitting: Cardiology

## 2022-08-30 ENCOUNTER — Other Ambulatory Visit: Payer: Medicaid Other

## 2022-08-30 NOTE — Telephone Encounter (Signed)
Please have front dest schedule her for cath, angioplasty left leg. It will be a long case. I will see her after angioplasty. Let me know the date   Let patient know that it is normal as I opened up that area

## 2022-08-30 NOTE — Patient Outreach (Signed)
Medicaid Managed Care Social Work Note  08/30/2022 Name:  Sharon Dyer MRN:  [id number] DOB:  07/01/1965  Sharon Dyer is an 57 y.o. year old female who is a primary patient of Kerin Perna, NP.  The Medicaid Managed Care Coordination team was consulted for assistance with:   Housing   Sharon Dyer was given information about Medicaid Managed Care Coordination team services today. Sharon Dyer Patient agreed to services and verbal consent obtained.  Engaged with patient  for by telephone forinitial visit in response to referral for case management and/or care coordination services.   Assessments/Interventions:  Review of past medical history, allergies, medications, health status, including review of consultants reports, laboratory and other test data, was performed as part of comprehensive evaluation and provision of chronic care management services.  SDOH: (Social Determinant of Health) assessments and interventions performed: SDOH Interventions    Flowsheet Row Office Visit from 02/05/2022 [facility] [place]  SDOH Interventions   Depression Interventions/Treatment  Medication, Counseling     BSW completed a telephone outreach with patient, she stated her doctors office told her BSW would be calling about the eatwell or healthy helpings program. BSW informed patient she would have to go through her health plan for the program. Patient stated she did have the telephone number. Patient stated she would also like housing resources for affordable housing. BSW will email resources to [email or web address]. No other resources needed at this time.  Advanced Directives Status:  Not addressed in this encounter.  Care Plan                 No Known Allergies  Medications Reviewed Today     Reviewed by Adrian Prows, MD (Physician) on 08/16/22 at 0752  Med List Status: Complete   Medication Order Taking? Sig Documenting Provider Last Dose Status Informant   amLODipine (NORVASC) 10 MG tablet [id number] Yes TAKE ONE TABLET BY MOUTH DAILY Kerin Perna, NP 08/16/2022 0400 Active Self  aspirin (ASPIRIN CHILDRENS) 81 MG chewable tablet [id number] Yes Chew 1 tablet (81 mg total) by mouth daily. Adrian Prows, MD 08/16/2022 0400 Active Self  atorvastatin (LIPITOR) 20 MG tablet [id number] Yes Take 1 tablet (20 mg total) by mouth daily.  Patient taking differently: Take 20 mg by mouth daily in the afternoon.   Kerin Perna, NP 08/15/2022 2000 Active Self  buPROPion (WELLBUTRIN SR) 150 MG 12 hr tablet [id number] Yes Take 1 tablet (150 mg total) by mouth 2 (two) times daily. Morning and late afternoon Adrian Prows, MD 08/16/2022 0400 Active Self  busPIRone (BUSPAR) 5 MG tablet [id number] Yes Take 1 tablet (5 mg total) by mouth 2 (two) times daily. Kerin Perna, NP 08/16/2022 0400 Active Self  cilostazol (PLETAL) 100 MG tablet [id number] Yes Take 1 tablet (100 mg total) by mouth 2 (two) times daily. Adrian Prows, MD 08/16/2022 0400 Active Self  gabapentin (NEURONTIN) 100 MG capsule [id number] Yes Take 1 capsule (100 mg total) by mouth 3 (three) times daily. Kerin Perna, NP 08/16/2022 0400 Active Self  HYDROcodone-acetaminophen (NORCO/VICODIN) 5-325 MG tablet [id number] No Take 1 tablet by mouth every 6 (six) hours as needed for severe pain. Ripley Fraise, MD not yet Active Self           Med Note Jeanie Cooks Aug 14, 2022  2:07 PM) New medication patient has not picked up  losartan-hydrochlorothiazide (HYZAAR) 100-25 MG tablet [id number] Yes Take 1 tablet by mouth daily. Oletta Lamas,  Milford Cage, NP 08/16/2022 0400 Active Self  nicotine (NICODERM CQ) 21 mg/24hr patch [id number] Yes Place 1 patch (21 mg total) onto the skin daily. Adrian Prows, MD 08/16/2022 0400 Active Self  ondansetron (ZOFRAN-ODT) 8 MG disintegrating tablet [id number] No Take 1 tablet (8 mg total) by mouth every 8 (eight) hours as needed. Ripley Fraise, MD not yet Active Self           Med  Note Kenton Kingfisher, Germaine Pomfret Aug 14, 2022  2:07 PM) New medication patient has not picked up   Vitamin D, Ergocalciferol, (DRISDOL) 1.25 MG (50000 UNIT) CAPS capsule [id number] Yes Take 1 capsule (50,000 Units total) by mouth every 7 (seven) days.  Patient taking differently: Take 50,000 Units by mouth every Monday.   Kerin Perna, NP 08/12/2022 Active Self            Patient Active Problem List   Diagnosis Date Noted   Claudication in peripheral vascular disease ([place]) 08/16/2022   AKI (acute kidney injury) ([place]) 01/29/2018   Hypokalemia 01/29/2018   Respiratory distress 01/29/2018   Tobacco abuse 01/29/2018   Hypoxemia 12/22/2017    Conditions to be addressed/monitored per PCP order:   housing  There are no care plans that you recently modified to display for this patient.   Follow up:  Patient agrees to Care Plan and Follow-up.  Plan: The Managed Medicaid care management team will reach out to the patient again over the next 30 days.  Date/time of next scheduled Social Work care management/care coordination outreach:  09/30/22 Mickel Fuchs, Arita Miss, [place] Medicaid Team  [phone]

## 2022-08-30 NOTE — Patient Instructions (Signed)
Visit Information  Sharon Dyer was given information about Medicaid Managed Care team care coordination services as a part of their [place] Medicaid benefit. Sharon Dyer verbally consentedto engagement with the [facility] Managed Care team.   If you are experiencing a medical emergency, please call 911 or report to your local emergency department or urgent care.   If you have a non-emergency medical problem during routine business hours, please contact your provider's office and ask to speak with a nurse.   For questions related to your [facility] health plan, please call: [phone]  OR visit the member homepage at: [email or web address]  If you would like to schedule transportation through your [facility] plan, please call the following number at least 2 days in advance of your appointment: [phone]  If you are experiencing a behavioral health crisis, call the [place] at [phone]). The line is available 24 hours a day, seven days a week.  If you would like help to quit smoking, call 1-800-QUIT-NOW (865) 543-3207) OR Espaol: 1-855-Djelo-Ya QO:409462) o para ms informacin haga clic aqu or Text READY to 200-400 to register via text  Sharon Dyer - following are the goals we discussed in your visit today:   Goals Addressed   None      Social Worker will follow up in 30 days.  Sharon Dyer, BSW, [place] Managed Medicaid Team  [phone]   Following is a copy of your plan of care:  There are no care plans that you recently modified to display for this patient.

## 2022-08-30 NOTE — Telephone Encounter (Signed)
From patient.

## 2022-09-01 ENCOUNTER — Other Ambulatory Visit (INDEPENDENT_AMBULATORY_CARE_PROVIDER_SITE_OTHER): Payer: Self-pay | Admitting: Primary Care

## 2022-09-01 ENCOUNTER — Encounter: Payer: Self-pay | Admitting: Cardiology

## 2022-09-01 DIAGNOSIS — R202 Paresthesia of skin: Secondary | ICD-10-CM

## 2022-09-02 NOTE — Telephone Encounter (Signed)
Will forward to provider  

## 2022-09-06 ENCOUNTER — Observation Stay (HOSPITAL_COMMUNITY)
Admission: RE | Admit: 2022-09-06 | Discharge: 2022-09-07 | Disposition: A | Discharge status: Home / Self Care | Payer: No Typology Code available for payment source | Attending: Internal Medicine | Admitting: Internal Medicine

## 2022-09-06 ENCOUNTER — Other Ambulatory Visit: Payer: Self-pay

## 2022-09-06 ENCOUNTER — Encounter (HOSPITAL_COMMUNITY)
Admission: RE | Disposition: A | Discharge status: Home / Self Care | Payer: Self-pay | Source: Home / Self Care | Attending: Cardiology

## 2022-09-06 DIAGNOSIS — S7010XA Contusion of unspecified thigh, initial encounter: Principal | ICD-10-CM | POA: Insufficient documentation

## 2022-09-06 DIAGNOSIS — F17210 Nicotine dependence, cigarettes, uncomplicated: Secondary | ICD-10-CM | POA: Insufficient documentation

## 2022-09-06 DIAGNOSIS — R600 Localized edema: Secondary | ICD-10-CM | POA: Diagnosis present

## 2022-09-06 DIAGNOSIS — I739 Peripheral vascular disease, unspecified: Secondary | ICD-10-CM | POA: Diagnosis not present

## 2022-09-06 DIAGNOSIS — X58XXXA Exposure to other specified factors, initial encounter: Secondary | ICD-10-CM | POA: Insufficient documentation

## 2022-09-06 DIAGNOSIS — I10 Essential (primary) hypertension: Secondary | ICD-10-CM | POA: Diagnosis not present

## 2022-09-06 HISTORY — PX: PERIPHERAL VASCULAR INTERVENTION: CATH118257

## 2022-09-06 HISTORY — PX: ABDOMINAL AORTOGRAM W/LOWER EXTREMITY: CATH118223

## 2022-09-06 HISTORY — PX: PERIPHERAL VASCULAR ATHERECTOMY: CATH118256

## 2022-09-06 LAB — CBC
HCT: 38.6 % (ref 36.0–46.0)
Hemoglobin: 12.7 g/dL (ref 12.0–15.0)
MCH: 30.2 pg (ref 26.0–34.0)
MCHC: 32.9 g/dL (ref 30.0–36.0)
MCV: 91.9 fL (ref 80.0–100.0)
Platelets: 205 10*3/uL (ref 150–400)
RBC: 4.2 MIL/uL (ref 3.87–5.11)
RDW: 14.1 % (ref 11.5–15.5)
WBC: 8.7 10*3/uL (ref 4.0–10.5)
nRBC: 0 % (ref 0.0–0.2)

## 2022-09-06 LAB — BASIC METABOLIC PANEL
Anion gap: 9 (ref 5–15)
BUN: 14 mg/dL (ref 6–20)
CO2: 27 mmol/L (ref 22–32)
Calcium: 9.3 mg/dL (ref 8.9–10.3)
Chloride: 104 mmol/L (ref 98–111)
Creatinine, Ser: 0.95 mg/dL (ref 0.44–1.00)
GFR, Estimated: >60 mL/min (ref >60)
Glucose, Bld: 94 mg/dL (ref 70–99)
Potassium: 3.6 mmol/L (ref 3.5–5.1)
Sodium: 140 mmol/L (ref 135–145)

## 2022-09-06 LAB — POCT ACTIVATED CLOTTING TIME
Activated Clotting Time: 212 seconds
Activated Clotting Time: 212 seconds

## 2022-09-06 SURGERY — PERIPHERAL VASCULAR ATHERECTOMY
Anesthesia: LOCAL

## 2022-09-06 MED ORDER — SODIUM CHLORIDE 0.9 % IV SOLN: 250.0000 mL | INTRAVENOUS | Status: DC | PRN

## 2022-09-06 MED ORDER — SODIUM CHLORIDE 0.9% FLUSH: 3.0000 mL | Freq: Two times a day (BID) | INTRAVENOUS | Status: DC

## 2022-09-06 MED ORDER — SODIUM CHLORIDE 0.9 % WEIGHT BASED INFUSION
3.0000 mL/kg/h | INTRAVENOUS | Status: DC
  Administered 2022-09-06: 3 mL/kg/h via INTRAVENOUS

## 2022-09-06 MED ORDER — MIDAZOLAM HCL 2 MG/2ML IJ SOLN
INTRAMUSCULAR | Status: DC | PRN
  Administered 2022-09-06 (×4): 1 mg via INTRAVENOUS

## 2022-09-06 MED ORDER — HEPARIN SODIUM (PORCINE) 1000 UNIT/ML IJ SOLN
INTRAMUSCULAR | Status: DC | PRN
  Administered 2022-09-06: 2000 [IU] via INTRAVENOUS
  Administered 2022-09-06: 3000 [IU] via INTRAVENOUS
  Administered 2022-09-06: 7000 [IU] via INTRAVENOUS
  Administered 2022-09-06: 1000 [IU] via INTRAVENOUS

## 2022-09-06 MED ORDER — HEPARIN SODIUM (PORCINE) 1000 UNIT/ML IJ SOLN
INTRAMUSCULAR | Status: AC
  Filled 2022-09-06: qty 10

## 2022-09-06 MED ORDER — IODIXANOL 320 MG/ML IV SOLN
INTRAVENOUS | Status: DC | PRN
  Administered 2022-09-06: 150 mL via INTRA_ARTERIAL

## 2022-09-06 MED ORDER — LIDOCAINE HCL (PF) 1 % IJ SOLN
INTRAMUSCULAR | Status: AC
  Filled 2022-09-06: qty 30

## 2022-09-06 MED ORDER — NITROGLYCERIN 1 MG/10 ML FOR IR/CATH LAB
INTRA_ARTERIAL | Status: AC
  Filled 2022-09-06: qty 10

## 2022-09-06 MED ORDER — ONDANSETRON HCL 4 MG/2ML IJ SOLN: 4.0000 mg | Freq: Four times a day (QID) | INTRAMUSCULAR | Status: DC | PRN

## 2022-09-06 MED ORDER — MIDAZOLAM HCL 2 MG/2ML IJ SOLN
INTRAMUSCULAR | Status: AC
  Filled 2022-09-06: qty 2

## 2022-09-06 MED ORDER — FENTANYL CITRATE (PF) 100 MCG/2ML IJ SOLN
INTRAMUSCULAR | Status: DC | PRN
  Administered 2022-09-06: 25 ug via INTRAVENOUS
  Administered 2022-09-06: 50 ug via INTRAVENOUS
  Administered 2022-09-06 (×2): 25 ug via INTRAVENOUS
  Administered 2022-09-06 (×2): 50 ug via INTRAVENOUS

## 2022-09-06 MED ORDER — FENTANYL CITRATE (PF) 100 MCG/2ML IJ SOLN
INTRAMUSCULAR | Status: AC
  Filled 2022-09-06: qty 2

## 2022-09-06 MED ORDER — LIDOCAINE HCL (PF) 1 % IJ SOLN
INTRAMUSCULAR | Status: DC | PRN
  Administered 2022-09-06: 12 mL

## 2022-09-06 MED ORDER — FENTANYL CITRATE PF 50 MCG/ML IJ SOSY: 50.0000 ug | PREFILLED_SYRINGE | Freq: Three times a day (TID) | INTRAMUSCULAR | Status: DC | PRN

## 2022-09-06 MED ORDER — CLOPIDOGREL BISULFATE 75 MG PO TABS: 75.0000 mg | ORAL_TABLET | Freq: Once | ORAL | Status: DC

## 2022-09-06 MED ORDER — SODIUM CHLORIDE 0.9 % WEIGHT BASED INFUSION: 1.0000 mL/kg/h | INTRAVENOUS | Status: AC

## 2022-09-06 MED ORDER — ACETAMINOPHEN 325 MG PO TABS
650.0000 mg | ORAL_TABLET | ORAL | Status: DC | PRN
  Administered 2022-09-06: 650 mg via ORAL
  Filled 2022-09-06: qty 2

## 2022-09-06 MED ORDER — DIAZEPAM 5 MG PO TABS: 5.0000 mg | ORAL_TABLET | Freq: Every evening | ORAL | Status: DC | PRN

## 2022-09-06 MED ORDER — ASPIRIN 81 MG PO CHEW: 81.0000 mg | CHEWABLE_TABLET | ORAL | Status: DC

## 2022-09-06 MED ORDER — SODIUM CHLORIDE 0.9% FLUSH
3.0000 mL | Freq: Two times a day (BID) | INTRAVENOUS | Status: DC
  Administered 2022-09-07: 3 mL via INTRAVENOUS

## 2022-09-06 MED ORDER — SODIUM CHLORIDE 0.9% FLUSH: 3.0000 mL | INTRAVENOUS | Status: DC | PRN

## 2022-09-06 MED ORDER — SODIUM CHLORIDE 0.9 % WEIGHT BASED INFUSION
1.0000 mL/kg/h | INTRAVENOUS | Status: DC
  Administered 2022-09-06: 1 mL/kg/h via INTRAVENOUS

## 2022-09-06 MED ORDER — HEPARIN (PORCINE) IN NACL 1000-0.9 UT/500ML-% IV SOLN
INTRAVENOUS | Status: DC | PRN
  Administered 2022-09-06 (×2): 500 mL

## 2022-09-06 MED ORDER — NITROGLYCERIN 1 MG/10 ML FOR IR/CATH LAB
INTRA_ARTERIAL | Status: DC | PRN
  Administered 2022-09-06: 300 ug via INTRA_ARTERIAL
  Administered 2022-09-06: 500 ug via INTRA_ARTERIAL

## 2022-09-06 SURGICAL SUPPLY — 29 items
BALLN ADMIRAL INPACT 6X250 (BALLOONS) ×2
BALLN COYOTE OTW 3X220X150 (BALLOONS) ×2
BALLOON ADMIRAL INPACT 6X250 (BALLOONS) IMPLANT
BALLOON COYOTE OTW 3X220X150 (BALLOONS) IMPLANT
CATH 0.018 NAVICROSS ANG 135 (CATHETERS) IMPLANT
CATH CROSS OVER TEMPO 5F (CATHETERS) IMPLANT
CATH HAWKONE LX EXTENDED TIP (CATHETERS) IMPLANT
CATH NAVICROSS ST .035X90CM (MICROCATHETER) IMPLANT
CATH SOFT-VU ST 4F 90CM (CATHETERS) IMPLANT
CATH SYNTRAX .014X135 (CATHETERS) IMPLANT
CLOSURE PERCLOSE PROSTYLE (VASCULAR PRODUCTS) IMPLANT
DEVICE SPIDERFX EMB PROT 7MM (WIRE) IMPLANT
GLIDEWIRE ADV .035X260CM (WIRE) IMPLANT
GUIDEWIRE ASTATO XS 20G 300CM (WIRE) IMPLANT
GUIDEWIRE NITREX 0.018X80X5 (WIRE) IMPLANT
KIT ENCORE 26 ADVANTAGE (KITS) IMPLANT
KIT MICROPUNCTURE NIT STIFF (SHEATH) IMPLANT
KIT PV (KITS) ×2 IMPLANT
SHEATH CATAPULT 7F 45 MP (SHEATH) IMPLANT
SHEATH PINNACLE 5F 10CM (SHEATH) IMPLANT
SHEATH PROBE COVER 6X72 (BAG) IMPLANT
STOPCOCK MORSE 400PSI 3WAY (MISCELLANEOUS) IMPLANT
SYR MEDRAD MARK 7 150ML (SYRINGE) ×2 IMPLANT
TAPE SHOOT N SEE (TAPE) IMPLANT
TRANSDUCER W/STOPCOCK (MISCELLANEOUS) ×2 IMPLANT
TRAY PV CATH (CUSTOM PROCEDURE TRAY) ×2 IMPLANT
TUBING CIL FLEX 10 FLL-RA (TUBING) IMPLANT
WIRE HI TORQ COMMND ES.014X300 (WIRE) IMPLANT
WIRE STARTER BENTSON 035X150 (WIRE) IMPLANT

## 2022-09-06 NOTE — Progress Notes (Signed)
Assumed care of pt at this time.

## 2022-09-06 NOTE — Interval H&P Note (Signed)
History and Physical Interval Note:  09/06/2022 11:21 AM  Sharon Dyer  has presented today for surgery, with the diagnosis of claudication.  The various methods of treatment have been discussed with the patient and family. After consideration of risks, benefits and other options for treatment, the patient has consented to  Procedure(s): ABDOMINAL AORTOGRAM W/LOWER EXTREMITY (N/A) and possible angioplasty as a surgical intervention.  The patient's history has been reviewed, patient examined, no change in status, stable for surgery.  I have reviewed the patient's chart and labs.  Questions were answered to the patient's satisfaction.     Adrian Prows

## 2022-09-06 NOTE — Progress Notes (Addendum)
Called for right groin management. Slight hematom present distal to stick site. Area was somewhat soft. Manual pressure held for 20 minutes. No sign of additional hematoma. Tegaderm dressing still present. Bedrest instructions given.   Right pt weak but palpable.   Area of remaining hematoma is 3 inch by 4 inch somewhat soft. Dr. Einar Gip notified.

## 2022-09-06 NOTE — Progress Notes (Addendum)
While assessing groin site noticed increased swelling around the area, palpated at hematoma approximately the size of a cell phone. Pressure held for 20 min, Dr. Shellia Carwin called orders given to apply femstop. Zach from cath lab called for femstop. Zach and from cath lab came and applied femstop to site. See his note for further details Femstop applied to right groin site at [place] femstop deflated to 75 At [place] femstop deflated to 62  At [place] femstop deflated to 45 At 1907 femstop deflated to 26 At 1917 femstop deflated to 13 At 1927 femstop deflated to 56 At 1937 femstop deflated to 10 At 1947 femstop deflated to [place] femstop removed from pt

## 2022-09-06 NOTE — Progress Notes (Signed)
Patient was given discharge instructions. She verbalized understanding. 

## 2022-09-06 NOTE — Progress Notes (Signed)
Called to SS to assess a groin Sharon Dyer that Sherlyn Lick had a previous assessed. Staff had reported that they had called Dr. Shellia Carwin with [facility] cardiology explaining the situation with the patient regarding having a hematoma that had been re appearing despite holding manual pressure. Manual pressure was held by [facility] for 20 minutes however hematoma still remained. They had called Dr. Shellia Carwin who ordered a femstop to be placed, helped SS RN place femstop while manual pressure was held, femstop placed at [id number] above systolic pressure for 2 minutes. Pulses unable to be dopplered, femstop deflated to 75 and doppler pulses found and immediately spoke with Dr. Einar Gip regarding femstop and groin issues. He advised to keep femstop on and follow normal deflation protocol and to admit the patient.

## 2022-09-06 NOTE — Progress Notes (Signed)
Went in to do second check patient had developed a 8x8 cm hematoma. Pressure was applied cath lab and doctor called.

## 2022-09-06 NOTE — H&P (View-Only) (Signed)
Primary Physician/Referring:  Kerin Perna, NP  Patient ID: Sharon Dyer, female    DOB: 12/24/1965, 57 y.o.   MRN: [id number]  No chief complaint on file.  HPI:    Sharon Dyer  is a 57 y.o. African-American female patient with hypertension, hyperlipidemia, tobacco use disorder, obesity, severe peripheral arterial disease and had undergone atherectomy followed by angioplasty to the right SFA on 08/16/2022, she has residual occluded left SFA.  She is now scheduled for an elective left SFA angioplasty in view of severe symptoms of claudication.  Patient states that she has not had any further right leg pain, has tolerated the procedure and has had no complications.  She is still not able to move around as she continues to have severe left leg pain with activity.  Since office visit, she has completely quit smoking and has changed her diet and is trying to lose weight. Past Medical History:  Diagnosis Date   Anxiety    Arthritis    Patient verbalizes she has athritis to right ankle   Dyspnea    Frozen shoulder syndrome 07/30/2019   Hyperlipidemia    Hypertension    Obesity    PAD (peripheral artery disease) ([place])    Past Surgical History:  Procedure Laterality Date   ABDOMINAL AORTOGRAM W/LOWER EXTREMITY Bilateral 08/16/2022   Procedure: ABDOMINAL AORTOGRAM W/LOWER EXTREMITY;  Surgeon: Adrian Prows, MD;  Location: [place] [facility];  Service: Cardiovascular;  Laterality: Bilateral;   ECTOPIC PREGNANCY SURGERY  X 2   PERIPHERAL VASCULAR ATHERECTOMY Right 08/16/2022   Procedure: PERIPHERAL VASCULAR ATHERECTOMY;  Surgeon: Adrian Prows, MD;  Location: [place] [facility];  Service: Cardiovascular;  Laterality: Right;  R SFA   PERIPHERAL VASCULAR BALLOON ANGIOPLASTY  08/16/2022   Procedure: PERIPHERAL VASCULAR BALLOON ANGIOPLASTY;  Surgeon: Adrian Prows, MD;  Location: [place] [facility];  Service: Cardiovascular;;   Family History  Problem Relation Age of Onset   Breast cancer Mother         in 16s   Hypertension Mother    Stroke Mother     Social History   Tobacco Use   Smoking status: Every Day    Packs/day: 1.00    Years: 37.00    Additional pack years: 0.00    Total pack years: 37.00    Types: Cigarettes    Start date: 60   Smokeless tobacco: Never   Tobacco comments:    Started on Wellbutrin for depression  Substance Use Topics   Alcohol use: Not Currently    Comment: 12/22/2017 "maybe twice a year"   Marital Status: Divorced  ROS  Review of Systems  Cardiovascular:  Positive for claudication. Negative for chest pain, dyspnea on exertion and leg swelling.   Objective      09/06/2022    7:29 AM 09/06/2022    6:00 AM 08/16/2022    4:00 PM  Vitals with BMI  Height 5\' 8"  5\' 8"    Weight 285 lbs 285 lbs   BMI 0000000 0000000   Systolic [id number]  [id number]  Diastolic 49  67  Pulse 67  68   SpO2: 95 %  Physical Exam Constitutional:      Appearance: She is obese.  Neck:     Vascular: No carotid bruit or JVD.  Cardiovascular:     Rate and Rhythm: Normal rate and regular rhythm.     Pulses: Intact distal pulses.          Femoral pulses are 2+  on the right side and 1+ on the left side.      Popliteal pulses are 1+ on the right side and 0 on the left side.       Dorsalis pedis pulses are 0 on the right side and 0 on the left side.       Posterior tibial pulses are 0 on the right side and 0 on the left side.     Heart sounds: Normal heart sounds. No murmur heard.    No gallop.  Pulmonary:     Effort: Pulmonary effort is normal.     Breath sounds: Normal breath sounds.  Abdominal:     General: Bowel sounds are normal.     Palpations: Abdomen is soft.  Musculoskeletal:     Right lower leg: No edema.     Left lower leg: No edema.     Medications and allergies  No Known Allergies   Medication list   Current Facility-Administered Medications:    0.9 %  sodium chloride infusion, 250 mL, Intravenous, PRN, Adrian Prows, MD   [EXPIRED] 0.9% sodium chloride  infusion, 3 mL/kg/hr, Intravenous, Continuous, Last Rate: 387.9 mL/hr at 09/06/22 0739, 3 mL/kg/hr at 09/06/22 0739 **FOLLOWED BY** 0.9% sodium chloride infusion, 1 mL/kg/hr, Intravenous, Continuous, Adrian Prows, MD, Last Rate: 129.3 mL/hr at 09/06/22 0840, 1 mL/kg/hr at 09/06/22 0840   aspirin chewable tablet 81 mg, 81 mg, Oral, Pre-Cath, Adrian Prows, MD   clopidogrel (PLAVIX) tablet 75 mg, 75 mg, Oral, Once, Adrian Prows, MD   sodium chloride flush (NS) 0.9 % injection 3 mL, 3 mL, Intravenous, Q12H, Adrian Prows, MD   sodium chloride flush (NS) 0.9 % injection 3 mL, 3 mL, Intravenous, PRN, Adrian Prows, MD Laboratory examination:   Recent Labs    10/12/21 2249 04/15/22 1052 08/06/22 1117 08/13/22 0409 09/06/22 0645  NA 143   < > 142 139 140  K 3.2*   < > 4.0 3.5 3.6  CL 106   < > 103 103 104  CO2 27   < > 23 26 27   GLUCOSE 115*   < > 93 124* 94  BUN 18   < > 14 15 14   CREATININE 1.10*   < > 0.87 1.32* 0.95  CALCIUM 9.3   < > 9.2 9.2 9.3  GFRNONAA 59*  --   --  47* >60   < > = values in this interval not displayed.     Lab Results  Component Value Date   GLUCOSE 94 09/06/2022   NA 140 09/06/2022   K 3.6 09/06/2022   CL 104 09/06/2022   CO2 27 09/06/2022   BUN 14 09/06/2022   CREATININE 0.95 09/06/2022   EGFR 77 08/06/2022   CALCIUM 9.3 09/06/2022   PROT 6.6 08/13/2022   ALBUMIN 3.2 (L) 08/13/2022   LABGLOB 3.1 04/15/2022   AGRATIO 1.3 04/15/2022   BILITOT 0.3 08/13/2022   ALKPHOS 61 08/13/2022   AST 39 08/13/2022   ALT 25 08/13/2022   ANIONGAP 9 09/06/2022      Lab Results  Component Value Date   ALT 25 08/13/2022   AST 39 08/13/2022   ALKPHOS 61 08/13/2022   BILITOT 0.3 08/13/2022       Latest Ref Rng & Units 08/13/2022    4:09 AM 04/15/2022   10:52 AM 10/12/2021   10:49 PM  Hepatic Function  Total Protein 6.5 - 8.1 g/dL 6.6  7.2  7.0   Albumin 3.5 - 5.0 g/dL 3.2  4.1  3.7   AST 15 - 41 U/L 39  23  17   ALT 0 - 44 U/L 25  13  11    Alk Phosphatase 38 - 126 U/L  61  72  56   Total Bilirubin 0.3 - 1.2 mg/dL 0.3  0.3  0.6     Lipid Panel Recent Labs    04/15/22 1052  CHOL 185  TRIG 99  LDLCALC 126*  HDL 41  CHOLHDL 4.5*     HEMOGLOBIN A1C Lab Results  Component Value Date   HGBA1C 5.9 (A) 02/05/2022   Lab Results  Component Value Date   TSH 1.240 02/05/2019    Last vitamin D Lab Results  Component Value Date   VD25OH 5.4 (L) 08/06/2022     Radiology:   Chest x-ray two-view 01/21/2019: The heart size and mediastinal contours are within normal limits. Both lungs are clear. Mild degenerative changes of the spine.  Ultrasound of the abdomen 08/11/2021: 1. Study is positive for cholelithiasis. No findings to suggest an acute cholecystitis at this time. 2.  Hepatic steatosis  Cardiac Studies:   Echocardiogram 12/23/2017  Left ventricle: The cavity size was normal. Wall thickness was   increased in a pattern of mild LVH. There was mild focal basal   hypertrophy of the septum. Systolic function was normal. The   estimated ejection fraction was in the range of 55% to 60%. Wall   motion was normal; there were no regional wall motion  abnormalities. Doppler parameters are consistent with abnormal   left ventricular relaxation (grade 1 diastolic dysfunction).   Doppler parameters are consistent with high ventricular filling pressure. - Aortic valve: There was mild regurgitation. - Right ventricle: The cavity size was mildly dilated. - Pulmonary arteries: Systolic pressure was moderately to severely  increased. PA peak pressure: 58 mm Hg (S).  Lower Extremity Arterial Duplex 07/23/2022: Right: 75-99% stenosis noted in the superficial femoral artery.  Left: Total occlusion noted in the superficial femoral artery.  ABI: Right 0.73. TBI 0.52 left 0.68. TBI 0.52  PCV MYOCARDIAL PERFUSION WITH LEXISCAN 08/13/2022  Narrative Lexiscan Tetrofosmin stress test 08/13/2022: Lexiscan nuclear stress test performed using 1-day protocol. SPECT  images showed small sized, mild intensity, reversible perfusion defect in basal inferior myocardium.  Normal myocardial wall motion and thickening. Stress LVEF 60%. Low risk study.   Peripheral arteriogram and angioplasty 08/16/2022: Abdominal aorta gram reveals presence of 2 renal arteries 1 on either side, widely patent.  There is mild atherosclerotic changes of the mid and distal abdominal aorta.  Distal abdominal aorta has a small aneurysmal dilatation.  Aorta iliac bifurcation is widely patent. Right iliac artery is tortuous.  There is a focal diffuse 80% stenosis of the right mid SFA.  Right distal popliteal and tibioperoneal trunk has focal 80% diffuse stenosis.  Three-vessel runoff below the right leg.   Left iliac artery is widely patent.  Left SFA is occluded at its origin from the proximal segment all the way to the midsegment.  Profunda gives collaterals.  Left below-knee vessels not well-visualized due to poor contrast flow.   Intervention data: Successful directional atherectomy with HawkOne followed by drug-coated balloon angioplasty with 6.0 x 150 mm InPact Admiral balloon, right mid SFA stenosis reduced from 80% to 0% with brisk flow and maintenance of distal runoff.    Recommendation: Patient will be discharged home today with outpatient follow-up, I may even schedule her for elective left lower extremity arteriogram and angioplasty as it is  amenable for percutaneous revascularization in view of her severe symptoms.  EKG:   EKG 08/06/2022: Sinus rhythm with borderline first-degree AV block at the rate of 66 bpm, normal axis, incomplete right bundle branch block.  Poor R progression, cannot exclude anteroseptal infarct old.  EKG 08/06/2019: Normal sinus rhythm at rate of 89 bpm, low voltage complexes.  Assessment     ICD-10-CM   1. Claudication in peripheral vascular disease (HCC)  A999333 Basic metabolic panel    CBC    Basic metabolic panel    CBC      Right groin site is  healed well, patient wants to proceed with peripheral arteriogram as discussed previously and hence after telephone encounter, we are proceeding with left SFA angioplasty.  Patient also needs elective cholecystectomy, patient prefers to have lower extremity claudication taken care of first.  She is aware that she will be on DAPT for at least 30 days.   Adrian Prows, MD, [facility] 09/06/2022, 10:54 AM Office: [phone]

## 2022-09-06 NOTE — Progress Notes (Signed)
Primary Physician/Referring:  Kerin Perna, NP  Patient ID: Sharon Dyer, female    DOB: [birth year], 57 y.o.   MRN: [id number]  No chief complaint on file.  HPI:    Sharon Dyer  is a 57 y.o. African-American female patient with hypertension, hyperlipidemia, tobacco use disorder, obesity, severe peripheral arterial disease and had undergone atherectomy followed by angioplasty to the right SFA on 08/16/2022, she has residual occluded left SFA.  She is now scheduled for an elective left SFA angioplasty in view of severe symptoms of claudication.  Patient states that she has not had any further right leg pain, has tolerated the procedure and has had no complications.  She is still not able to move around as she continues to have severe left leg pain with activity.  Since office visit, she has completely quit smoking and has changed her diet and is trying to lose weight. Past Medical History:  Diagnosis Date   Anxiety    Arthritis    Patient verbalizes she has athritis to right ankle   Dyspnea    Frozen shoulder syndrome 07/30/2019   Hyperlipidemia    Hypertension    Obesity    PAD (peripheral artery disease) ([address])    Past Surgical History:  Procedure Laterality Date   ABDOMINAL AORTOGRAM W/LOWER EXTREMITY Bilateral 08/16/2022   Procedure: ABDOMINAL AORTOGRAM W/LOWER EXTREMITY;  Surgeon: Adrian Prows, MD;  Location: [place] [facility];  Service: Cardiovascular;  Laterality: Bilateral;   ECTOPIC PREGNANCY SURGERY  X 2   PERIPHERAL VASCULAR ATHERECTOMY Right 08/16/2022   Procedure: PERIPHERAL VASCULAR ATHERECTOMY;  Surgeon: Adrian Prows, MD;  Location: [place] [facility];  Service: Cardiovascular;  Laterality: Right;  R SFA   PERIPHERAL VASCULAR BALLOON ANGIOPLASTY  08/16/2022   Procedure: PERIPHERAL VASCULAR BALLOON ANGIOPLASTY;  Surgeon: Adrian Prows, MD;  Location: [place] [facility];  Service: Cardiovascular;;   Family History  Problem Relation Age of Onset   Breast cancer Mother         in 9s   Hypertension Mother    Stroke Mother     Social History   Tobacco Use   Smoking status: Every Day    Packs/day: 1.00    Years: 37.00    Additional pack years: 0.00    Total pack years: 37.00    Types: Cigarettes    Start date: 30   Smokeless tobacco: Never   Tobacco comments:    Started on Wellbutrin for depression  Substance Use Topics   Alcohol use: Not Currently    Comment: 12/22/2017 "maybe twice a year"   Marital Status: Divorced  ROS  Review of Systems  Cardiovascular:  Positive for claudication. Negative for chest pain, dyspnea on exertion and leg swelling.   Objective      09/06/2022    7:29 AM 09/06/2022    6:00 AM 08/16/2022    4:00 PM  Vitals with BMI  Height 5\' 8"  5\' 8"    Weight 285 lbs 285 lbs   BMI 0000000 0000000   Systolic [id number]  [id number]  Diastolic 49  67  Pulse 67  68   SpO2: 95 %  Physical Exam Constitutional:      Appearance: She is obese.  Neck:     Vascular: No carotid bruit or JVD.  Cardiovascular:     Rate and Rhythm: Normal rate and regular rhythm.     Pulses: Intact distal pulses.          Femoral pulses are 2+  on the right side and 1+ on the left side.      Popliteal pulses are 1+ on the right side and 0 on the left side.       Dorsalis pedis pulses are 0 on the right side and 0 on the left side.       Posterior tibial pulses are 0 on the right side and 0 on the left side.     Heart sounds: Normal heart sounds. No murmur heard.    No gallop.  Pulmonary:     Effort: Pulmonary effort is normal.     Breath sounds: Normal breath sounds.  Abdominal:     General: Bowel sounds are normal.     Palpations: Abdomen is soft.  Musculoskeletal:     Right lower leg: No edema.     Left lower leg: No edema.     Medications and allergies  No Known Allergies   Medication list   Current Facility-Administered Medications:    0.9 %  sodium chloride infusion, 250 mL, Intravenous, PRN, Adrian Prows, MD   [EXPIRED] 0.9% sodium chloride  infusion, 3 mL/kg/hr, Intravenous, Continuous, Last Rate: 387.9 mL/hr at 09/06/22 0739, 3 mL/kg/hr at 09/06/22 0739 **FOLLOWED BY** 0.9% sodium chloride infusion, 1 mL/kg/hr, Intravenous, Continuous, Adrian Prows, MD, Last Rate: 129.3 mL/hr at 09/06/22 0840, 1 mL/kg/hr at 09/06/22 0840   aspirin chewable tablet 81 mg, 81 mg, Oral, Pre-Cath, Adrian Prows, MD   clopidogrel (PLAVIX) tablet 75 mg, 75 mg, Oral, Once, Adrian Prows, MD   sodium chloride flush (NS) 0.9 % injection 3 mL, 3 mL, Intravenous, Q12H, Adrian Prows, MD   sodium chloride flush (NS) 0.9 % injection 3 mL, 3 mL, Intravenous, PRN, Adrian Prows, MD Laboratory examination:   Recent Labs    10/12/21 2249 04/15/22 1052 08/06/22 1117 08/13/22 0409 09/06/22 0645  NA 143   < > 142 139 140  K 3.2*   < > 4.0 3.5 3.6  CL 106   < > 103 103 104  CO2 27   < > 23 26 27   GLUCOSE 115*   < > 93 124* 94  BUN 18   < > 14 15 14   CREATININE 1.10*   < > 0.87 1.32* 0.95  CALCIUM 9.3   < > 9.2 9.2 9.3  GFRNONAA 59*  --   --  47* >60   < > = values in this interval not displayed.     Lab Results  Component Value Date   GLUCOSE 94 09/06/2022   NA 140 09/06/2022   K 3.6 09/06/2022   CL 104 09/06/2022   CO2 27 09/06/2022   BUN 14 09/06/2022   CREATININE 0.95 09/06/2022   EGFR 77 08/06/2022   CALCIUM 9.3 09/06/2022   PROT 6.6 08/13/2022   ALBUMIN 3.2 (L) 08/13/2022   LABGLOB 3.1 04/15/2022   AGRATIO 1.3 04/15/2022   BILITOT 0.3 08/13/2022   ALKPHOS 61 08/13/2022   AST 39 08/13/2022   ALT 25 08/13/2022   ANIONGAP 9 09/06/2022      Lab Results  Component Value Date   ALT 25 08/13/2022   AST 39 08/13/2022   ALKPHOS 61 08/13/2022   BILITOT 0.3 08/13/2022       Latest Ref Rng & Units 08/13/2022    4:09 AM 04/15/2022   10:52 AM 10/12/2021   10:49 PM  Hepatic Function  Total Protein 6.5 - 8.1 g/dL 6.6  7.2  7.0   Albumin 3.5 - 5.0 g/dL 3.2  4.1  3.7   AST 15 - 41 U/L 39  23  17   ALT 0 - 44 U/L 25  13  11    Alk Phosphatase 38 - 126 U/L  61  72  56   Total Bilirubin 0.3 - 1.2 mg/dL 0.3  0.3  0.6     Lipid Panel Recent Labs    04/15/22 1052  CHOL 185  TRIG 99  LDLCALC 126*  HDL 41  CHOLHDL 4.5*     HEMOGLOBIN A1C Lab Results  Component Value Date   HGBA1C 5.9 (A) 02/05/2022   Lab Results  Component Value Date   TSH 1.240 02/05/2019    Last vitamin D Lab Results  Component Value Date   VD25OH 5.4 (L) 08/06/2022     Radiology:   Chest x-ray two-view 01/21/2019: The heart size and mediastinal contours are within normal limits. Both lungs are clear. Mild degenerative changes of the spine.  Ultrasound of the abdomen 08/11/2021: 1. Study is positive for cholelithiasis. No findings to suggest an acute cholecystitis at this time. 2.  Hepatic steatosis  Cardiac Studies:   Echocardiogram 12/23/2017  Left ventricle: The cavity size was normal. Wall thickness was   increased in a pattern of mild LVH. There was mild focal basal   hypertrophy of the septum. Systolic function was normal. The   estimated ejection fraction was in the range of 55% to 60%. Wall   motion was normal; there were no regional wall motion  abnormalities. Doppler parameters are consistent with abnormal   left ventricular relaxation (grade 1 diastolic dysfunction).   Doppler parameters are consistent with high ventricular filling pressure. - Aortic valve: There was mild regurgitation. - Right ventricle: The cavity size was mildly dilated. - Pulmonary arteries: Systolic pressure was moderately to severely  increased. PA peak pressure: 58 mm Hg (S).  Lower Extremity Arterial Duplex 07/23/2022: Right: 75-99% stenosis noted in the superficial femoral artery.  Left: Total occlusion noted in the superficial femoral artery.  ABI: Right 0.73. TBI 0.52 left 0.68. TBI 0.52  PCV MYOCARDIAL PERFUSION WITH LEXISCAN 08/13/2022  Narrative Lexiscan Tetrofosmin stress test 08/13/2022: Lexiscan nuclear stress test performed using 1-day protocol. SPECT  images showed small sized, mild intensity, reversible perfusion defect in basal inferior myocardium.  Normal myocardial wall motion and thickening. Stress LVEF 60%. Low risk study.   Peripheral arteriogram and angioplasty 08/16/2022: Abdominal aorta gram reveals presence of 2 renal arteries 1 on either side, widely patent.  There is mild atherosclerotic changes of the mid and distal abdominal aorta.  Distal abdominal aorta has a small aneurysmal dilatation.  Aorta iliac bifurcation is widely patent. Right iliac artery is tortuous.  There is a focal diffuse 80% stenosis of the right mid SFA.  Right distal popliteal and tibioperoneal trunk has focal 80% diffuse stenosis.  Three-vessel runoff below the right leg.   Left iliac artery is widely patent.  Left SFA is occluded at its origin from the proximal segment all the way to the midsegment.  Profunda gives collaterals.  Left below-knee vessels not well-visualized due to poor contrast flow.   Intervention data: Successful directional atherectomy with HawkOne followed by drug-coated balloon angioplasty with 6.0 x 150 mm InPact Admiral balloon, right mid SFA stenosis reduced from 80% to 0% with brisk flow and maintenance of distal runoff.    Recommendation: Patient will be discharged home today with outpatient follow-up, I may even schedule her for elective left lower extremity arteriogram and angioplasty as it is  amenable for percutaneous revascularization in view of her severe symptoms.  EKG:   EKG 08/06/2022: Sinus rhythm with borderline first-degree AV block at the rate of 66 bpm, normal axis, incomplete right bundle branch block.  Poor R progression, cannot exclude anteroseptal infarct old.  EKG 08/06/2019: Normal sinus rhythm at rate of 89 bpm, low voltage complexes.  Assessment     ICD-10-CM   1. Claudication in peripheral vascular disease (HCC)  A999333 Basic metabolic panel    CBC    Basic metabolic panel    CBC      Right groin site is  healed well, patient wants to proceed with peripheral arteriogram as discussed previously and hence after telephone encounter, we are proceeding with left SFA angioplasty.  Patient also needs elective cholecystectomy, patient prefers to have lower extremity claudication taken care of first.  She is aware that she will be on DAPT for at least 30 days.   Adrian Prows, MD, [facility] 09/06/2022, 10:54 AM Office: [phone]

## 2022-09-07 DIAGNOSIS — R600 Localized edema: Secondary | ICD-10-CM | POA: Diagnosis present

## 2022-09-07 DIAGNOSIS — S7010XA Contusion of unspecified thigh, initial encounter: Secondary | ICD-10-CM | POA: Diagnosis not present

## 2022-09-07 LAB — CBC
HCT: 30.6 % — ABNORMAL LOW (ref 36.0–46.0)
Hemoglobin: 10.2 g/dL — ABNORMAL LOW (ref 12.0–15.0)
MCH: 30.5 pg (ref 26.0–34.0)
MCHC: 33.3 g/dL (ref 30.0–36.0)
MCV: 91.6 fL (ref 80.0–100.0)
Platelets: 163 10*3/uL (ref 150–400)
RBC: 3.34 MIL/uL — ABNORMAL LOW (ref 3.87–5.11)
RDW: 14.2 % (ref 11.5–15.5)
WBC: 9.5 10*3/uL (ref 4.0–10.5)
nRBC: 0 % (ref 0.0–0.2)

## 2022-09-07 LAB — BASIC METABOLIC PANEL
Anion gap: 8 (ref 5–15)
BUN: 17 mg/dL (ref 6–20)
CO2: 26 mmol/L (ref 22–32)
Calcium: 8.6 mg/dL — ABNORMAL LOW (ref 8.9–10.3)
Chloride: 106 mmol/L (ref 98–111)
Creatinine, Ser: 0.92 mg/dL (ref 0.44–1.00)
GFR, Estimated: >60 mL/min (ref >60)
Glucose, Bld: 107 mg/dL — ABNORMAL HIGH (ref 70–99)
Potassium: 3.7 mmol/L (ref 3.5–5.1)
Sodium: 140 mmol/L (ref 135–145)

## 2022-09-07 NOTE — Discharge Summary (Signed)
Physician Discharge Summary  Patient ID: Sharon Dyer MRN: [id number] DOB/AGE: 59-04-1966 59 y.o. Sharon Perna, Sharon Dyer   Admit date: 09/06/2022 Discharge date: 09/07/2022  Primary Discharge Diagnosis Femoral hematoma   Significant Diagnostic Studies: Stable femoral hematoma   Radiology: PERIPHERAL VASCULAR CATHETERIZATION  Result Date: 09/06/2022 Images from the original result were not included. Left SFA angioplasty 09/06/2022: Successful directional atherectomy with HawkOne followed by [facility] angioplasty with 6.0 x 250 mm In.Pact Admiral balloon, stenosis reduced from 100% to less than 5%.  Brisk flow noted below the proximal knee.  Complex procedure.  Prolonged procedure with multiple wires, multiple catheters and balloons. Recommendation: Continue risk factor modification is indicated.  She will continue her dual antiplatelet therapy for now until I am seeing her in the office for at least 1 month and she can be on aspirin or Plavix alone.  She will obtain ABI postprocedure for surveillance within next 2 to 3 weeks.  This is to follow-up on both right SFA angioplasty done previously on 08/16/2022.  She has moderate to severe disease below the knee and unless severe symptoms of claudication or wound, medical therapy for the same. Adrian Prows, MD, [facility] 09/06/2022, 2:22 PM Office: [phone] Fax: [phone] Pager: [phone]   PERIPHERAL VASCULAR CATHETERIZATION  Addendum Date: 08/16/2022   Peripheral arteriogram and angioplasty 08/16/2022: Abdominal aorta gram reveals presence of 2 renal arteries 1 on either side, widely patent.  There is mild atherosclerotic changes of the mid and distal abdominal aorta.  Distal abdominal aorta has a small aneurysmal dilatation.  Aorta iliac bifurcation is widely patent. Right iliac artery is tortuous.  There is a focal diffuse 80% stenosis of the right mid SFA.  Right distal popliteal and tibioperoneal trunk has focal 80% diffuse stenosis.  Three-vessel  runoff below the right leg. Left iliac artery is widely patent.  Left SFA is occluded at its origin from the proximal segment all the way to the midsegment.  Profunda gives collaterals.  Left below-knee vessels not well-visualized due to poor contrast flow. Intervention data: Successful directional atherectomy with HawkOne followed by drug-coated balloon angioplasty with 6.0 x 150 mm InPact Admiral balloon, right mid SFA stenosis reduced from 80% to 0% with brisk flow and maintenance of distal runoff. Recommendation: Patient will be discharged home today with outpatient follow-up, I may even schedule her for elective left lower extremity arteriogram and angioplasty as it is amenable for percutaneous revascularization in view of her severe symptoms.  Result Date: 08/16/2022 Peripheral arteriogram and angioplasty 08/16/2022: Abdominal aorta gram reveals presence of 2 renal arteries 1 on either side, widely patent.  There is mild atherosclerotic changes of the mid and distal abdominal aorta.  Distal abdominal aorta has a small aneurysmal dilatation.  Aorta iliac bifurcation is widely patent. Right iliac artery is tortuous.  There is a focal diffuse 80% stenosis of the right mid SFA.  Right distal popliteal and tibioperoneal trunk has focal 80% diffuse stenosis.  Three-vessel runoff below the right leg. Left iliac artery is widely patent.  Left SFA is occluded at its origin from the proximal segment all the way to the midsegment.  Profunda gives collaterals.  Left below-knee vessels not well-visualized due to poor contrast flow. Intervention data: Successful directional atherectomy with HawkOne followed by drug-coated balloon angioplasty with 6.0 x 150 mm InPact Admiral balloon, right mid SFA stenosis reduced from 80% to 0% with brisk flow and maintenance of distal runoff. Recommendation: Patient will be discharged home today with outpatient follow-up, I may  even schedule her for elective left lower extremity arteriogram  and angioplasty as it is amenable for percutaneous revascularization in view of her severe symptoms.   PCV MYOCARDIAL PERFUSION WITH LEXISCAN  Result Date: 08/13/2022 Lexiscan Tetrofosmin stress test 08/13/2022: Lexiscan nuclear stress test performed using 1-day protocol. SPECT images showed small sized, mild intensity, reversible perfusion defect in basal inferior myocardium.  Normal myocardial wall motion and thickening. Stress LVEF 60%. Low risk study.   US Abdomen Limited RUQ (LIVER/GB)  Result Date: 08/13/2022 CLINICAL DATA:  57 year old female presenting with history of right upper quadrant abdominal pain. EXAM: ULTRASOUND ABDOMEN LIMITED RIGHT UPPER QUADRANT COMPARISON:  Right upper quadrant abdominal ultrasound 08/11/2021. FINDINGS: Gallbladder: Echogenic foci with posterior acoustic shadowing measuring up to 1.9 cm in diameter in the neck of the gallbladder. Gallbladder is not distended. Gallbladder wall thickness is normal at 2.9 mm. No pericholecystic fluid. Per report from the sonographer, there was no sonographic Murphy's sign on examination. Common bile duct: Diameter: 6 mm Liver: No focal lesion identified. Mild diffuse increased echogenicity throughout the hepatic parenchyma. Portal vein is patent on color Doppler imaging with normal direction of blood flow towards the liver. Other: None. IMPRESSION: 1. Study is positive for cholelithiasis, but there are no imaging findings to suggest an acute cholecystitis at this time. 2. Hepatic steatosis. Electronically Signed   By: Vinnie Langton M.D.   On: 08/13/2022 05:53    Hospital Course: Sharon Dyer is a 57 y.o. female  patient s/p aortogram who developed right femoral hematoma admitted to observation for monitoring. Fem stop placed and patient ready to go home in the morning. No active bleeding, hematoma decreased in size. No tenderness.  Recommendations on discharge: Monitor for bleeding, bruising, growing size of hematoma. Follow-up  scheduled with PCV.  Discharge Exam:    09/07/2022    7:19 AM 09/07/2022    3:12 AM 09/06/2022    9:14 PM  Vitals with BMI  Systolic [id number]  Diastolic 61 77 68  Pulse 77 77 70     Physical Exam  Labs:   Lab Results  Component Value Date   WBC 9.5 09/07/2022   HGB 10.2 (L) 09/07/2022   HCT 30.6 (L) 09/07/2022   MCV 91.6 09/07/2022   PLT 163 09/07/2022    Recent Labs  Lab 09/07/22 0118  NA 140  K 3.7  CL 106  CO2 26  BUN 17  CREATININE 0.92  CALCIUM 8.6*  GLUCOSE 107*    Lipid Panel     Component Value Date/Time   CHOL 185 04/15/2022 1052   TRIG 99 04/15/2022 1052   HDL 41 04/15/2022 1052   CHOLHDL 4.5 (H) 04/15/2022 1052   LDLCALC 126 (H) 04/15/2022 1052    BNP (last 3 results) No results for input(s): "BNP" in the last 8760 hours.  HEMOGLOBIN A1C Lab Results  Component Value Date   HGBA1C 5.9 (A) 02/05/2022    Cardiac Panel (last 3 results) No results for input(s): "CKTOTAL", "CKMB", "TROPONINIHS", "RELINDX" in the last 72 hours.   TSH No results for input(s): "TSH" in the last 8760 hours.  FOLLOW UP PLANS AND APPOINTMENTS  Allergies as of 09/07/2022   No Known Allergies      Medication List     TAKE these medications    amLODipine 10 MG tablet Commonly known as: NORVASC TAKE ONE TABLET BY MOUTH DAILY   aspirin 81 MG chewable tablet Commonly known as: Aspirin Childrens Chew 1 tablet (81 mg total)  by mouth daily.   atorvastatin 20 MG tablet Commonly known as: LIPITOR Take 1 tablet (20 mg total) by mouth daily. What changed: when to take this   buPROPion 150 MG 12 hr tablet Commonly known as: Wellbutrin SR Take 1 tablet (150 mg total) by mouth 2 (two) times daily. Morning and late afternoon   busPIRone 5 MG tablet Commonly known as: BUSPAR Take 1 tablet (5 mg total) by mouth 2 (two) times daily.   cilostazol 100 MG tablet Commonly known as: PLETAL Take 1 tablet (100 mg total) by mouth 2 (two) times daily.    clopidogrel 75 MG tablet Commonly known as: Plavix Take 1 tablet (75 mg total) by mouth daily.   gabapentin 100 MG capsule Commonly known as: NEURONTIN TAKE 1 CAPSULE(100 MG) BY MOUTH THREE TIMES DAILY   HYDROcodone-acetaminophen 5-325 MG tablet Commonly known as: NORCO/VICODIN Take 1 tablet by mouth every 6 (six) hours as needed for severe pain.   ibuprofen 800 MG tablet Commonly known as: ADVIL Take 800 mg by mouth every 8 (eight) hours as needed for moderate pain or mild pain.   losartan-hydrochlorothiazide 100-25 MG tablet Commonly known as: HYZAAR Take 1 tablet by mouth daily.   nicotine 21 mg/24hr patch Commonly known as: Nicoderm CQ Place 1 patch (21 mg total) onto the skin daily.   ondansetron 8 MG disintegrating tablet Commonly known as: ZOFRAN-ODT Take 1 tablet (8 mg total) by mouth every 8 (eight) hours as needed.   Vitamin D (Ergocalciferol) 1.25 MG (50000 UNIT) Caps capsule Commonly known as: DRISDOL Take 1 capsule (50,000 Units total) by mouth every 7 (seven) days. What changed: when to take this        Follow-up Information     Adrian Prows, MD Follow up on 09/17/2022.   Specialty: Cardiology Why: 09/17/2022 9:15 AM (Arrive by 9:00 AM) Contact information: [place] [id number] [place] [address], DO 09/07/2022, 8:27 PM Office: [phone]

## 2022-09-09 ENCOUNTER — Telehealth: Payer: Self-pay

## 2022-09-09 ENCOUNTER — Telehealth (INDEPENDENT_AMBULATORY_CARE_PROVIDER_SITE_OTHER): Payer: Self-pay

## 2022-09-09 ENCOUNTER — Encounter (HOSPITAL_COMMUNITY): Payer: Self-pay | Admitting: Cardiology

## 2022-09-09 NOTE — Transitions of Care (Post Inpatient/ED Visit) (Signed)
   09/09/2022  Name: Sharon Dyer MRN: [id number] DOB: [birth year]  Today's TOC FU Call Status: Today's TOC FU Call Status:: Successful TOC FU Call Competed TOC FU Call Complete Date: 09/09/22  Transition Care Management Follow-up Telephone Call Date of Discharge: 09/07/22 [facility] Facility[facility] Type of Discharge: Inpatient Admission Primary Inpatient Discharge Diagnosis:: peripheral artery disease How have you been since you were released from the hospital?: Better Any questions or concerns?: Yes Patient Questions/Concerns:: She explained that she has a large painful bruise from teh hematoma. She said it is very sore and has already contacted Dr Irven Shelling office to inform them  She said other than the bruise, she is feeling much better than when she was admitted to the hospital. Patient Questions/Concerns Addressed: Other: (the patient has already contacted Dr Irven Shelling office)  Items Reviewed: Did you receive and understand the discharge instructions provided?: Yes Medications obtained and verified?: Yes (Medications Reviewed) (She said she has all of her medications except the aspirin which she needs to pick up today) Any new allergies since your discharge?: No Dietary orders reviewed?: Yes Type of Diet Ordered:: heart healthy Do you have support at home?: Yes People in Home: significant other  Home Care and Equipment/Supplies: [place] Ordered?: No Any new equipment or medical supplies ordered?: No  Functional Questionnaire: Do you need assistance with meal preparation?: No Do you need assistance with eating?: No Do you have difficulty maintaining continence: No Do you need assistance with getting out of bed/getting out of a chair/moving?: No Do you have difficulty managing or taking your medications?: No  Follow up appointments reviewed: PCP Follow-up appointment confirmed?: No MD Provider Line [phone] Given: No (She has the phone  number for [facility] and will call to schedule an appt when ready. She just saw PCP 07/2022.) [facility] Follow-up appointment confirmed?: Yes Date of Specialist follow-up appointment?: 09/17/22 Follow-Up Specialty Provider:: Dr Einar Gip Do you need transportation to your follow-up appointment?: No (She understands that she can call her insurance company to schedule rides to medical appts.  She also understands that they need a 2-3 day notice to arrange the ride.) Do you understand care options if your condition(s) worsen?: Yes-patient verbalized understanding    SIGNATURE Eden Lathe, RN

## 2022-09-09 NOTE — Telephone Encounter (Signed)
Patient aware.

## 2022-09-09 NOTE — Telephone Encounter (Signed)
Patient called and said she has a hugh bruise on her thigh and is having trouble walking due to the pain, should she be concerened?

## 2022-09-09 NOTE — Telephone Encounter (Signed)
TOC already completed by office.

## 2022-09-11 ENCOUNTER — Encounter: Payer: Self-pay | Admitting: Cardiology

## 2022-09-11 ENCOUNTER — Telehealth: Payer: Self-pay

## 2022-09-11 ENCOUNTER — Other Ambulatory Visit: Payer: Self-pay | Admitting: Cardiology

## 2022-09-11 DIAGNOSIS — I739 Peripheral vascular disease, unspecified: Secondary | ICD-10-CM

## 2022-09-11 DIAGNOSIS — Z9862 Peripheral vascular angioplasty status: Secondary | ICD-10-CM

## 2022-09-11 NOTE — Telephone Encounter (Signed)
It will get better, see if we have an opening for right groin [place] for pseudoaneurysm and also Do ABI today or tomorrow. I can put [place] for groin order.   I can call in some pain meds

## 2022-09-11 NOTE — Telephone Encounter (Signed)
Patient was offered an earlier appt for the [place] of her leg, but her mom passed away and she is unable to come earlier than the 12th.

## 2022-09-11 NOTE — Telephone Encounter (Signed)
Patient is calling to let us know the bruise on her leg is getting larger and more painful. She is scheduled for an u/s on 09/20/22 but they were not able to get her in any sooner.  Please advise and I will call her back.

## 2022-09-11 NOTE — Telephone Encounter (Signed)
Sharon Dyer is working on getting her scheduled, I will let the patient know you are calling in some pain meds as well.

## 2022-09-14 ENCOUNTER — Emergency Department (HOSPITAL_COMMUNITY)
Admission: EM | Admit: 2022-09-14 | Discharge: 2022-09-14 | Disposition: A | Discharge status: Home / Self Care | Payer: No Typology Code available for payment source | Attending: Emergency Medicine | Admitting: Emergency Medicine

## 2022-09-14 ENCOUNTER — Encounter (HOSPITAL_COMMUNITY): Payer: Self-pay

## 2022-09-14 ENCOUNTER — Other Ambulatory Visit: Payer: Self-pay

## 2022-09-14 ENCOUNTER — Emergency Department (HOSPITAL_BASED_OUTPATIENT_CLINIC_OR_DEPARTMENT_OTHER): Payer: No Typology Code available for payment source

## 2022-09-14 DIAGNOSIS — G8918 Other acute postprocedural pain: Secondary | ICD-10-CM | POA: Diagnosis not present

## 2022-09-14 DIAGNOSIS — I724 Aneurysm of artery of lower extremity: Secondary | ICD-10-CM

## 2022-09-14 DIAGNOSIS — Y838 Other surgical procedures as the cause of abnormal reaction of the patient, or of later complication, without mention of misadventure at the time of the procedure: Secondary | ICD-10-CM | POA: Insufficient documentation

## 2022-09-14 DIAGNOSIS — L7632 Postprocedural hematoma of skin and subcutaneous tissue following other procedure: Secondary | ICD-10-CM | POA: Insufficient documentation

## 2022-09-14 DIAGNOSIS — R10813 Right lower quadrant abdominal tenderness: Secondary | ICD-10-CM | POA: Diagnosis present

## 2022-09-14 LAB — CBC WITH DIFFERENTIAL/PLATELET
Abs Immature Granulocytes: 0.06 10*3/uL (ref 0.00–0.07)
Basophils Absolute: 0.1 10*3/uL (ref 0.0–0.1)
Basophils Relative: 1 %
Eosinophils Absolute: 0.5 10*3/uL (ref 0.0–0.5)
Eosinophils Relative: 5 %
HCT: 31.8 % — ABNORMAL LOW (ref 36.0–46.0)
Hemoglobin: 10.4 g/dL — ABNORMAL LOW (ref 12.0–15.0)
Immature Granulocytes: 1 %
Lymphocytes Relative: 24 %
Lymphs Abs: 2.2 10*3/uL (ref 0.7–4.0)
MCH: 30.8 pg (ref 26.0–34.0)
MCHC: 32.7 g/dL (ref 30.0–36.0)
MCV: 94.1 fL (ref 80.0–100.0)
Monocytes Absolute: 1 10*3/uL (ref 0.1–1.0)
Monocytes Relative: 11 %
Neutro Abs: 5.5 10*3/uL (ref 1.7–7.7)
Neutrophils Relative %: 58 %
Platelets: 203 10*3/uL (ref 150–400)
RBC: 3.38 MIL/uL — ABNORMAL LOW (ref 3.87–5.11)
RDW: 14.3 % (ref 11.5–15.5)
WBC: 9.3 10*3/uL (ref 4.0–10.5)
nRBC: 0 % (ref 0.0–0.2)

## 2022-09-14 LAB — BASIC METABOLIC PANEL
Anion gap: 9 (ref 5–15)
BUN: 11 mg/dL (ref 6–20)
CO2: 24 mmol/L (ref 22–32)
Calcium: 8.6 mg/dL — ABNORMAL LOW (ref 8.9–10.3)
Chloride: 104 mmol/L (ref 98–111)
Creatinine, Ser: 0.84 mg/dL (ref 0.44–1.00)
GFR, Estimated: >60 mL/min (ref >60)
Glucose, Bld: 100 mg/dL — ABNORMAL HIGH (ref 70–99)
Potassium: 3.4 mmol/L — ABNORMAL LOW (ref 3.5–5.1)
Sodium: 137 mmol/L (ref 135–145)

## 2022-09-14 MED ORDER — OXYCODONE HCL 5 MG PO TABS
5.0000 mg | ORAL_TABLET | Freq: Once | ORAL | Status: AC
  Administered 2022-09-14: 5 mg via ORAL
  Filled 2022-09-14: qty 1

## 2022-09-14 MED ORDER — DICLOFENAC SODIUM 1 % EX GEL: 4.0000 g | Freq: Four times a day (QID) | CUTANEOUS | 0 refills | Status: DC

## 2022-09-14 MED ORDER — ACETAMINOPHEN 500 MG PO TABS
1000.0000 mg | ORAL_TABLET | Freq: Once | ORAL | Status: AC
  Administered 2022-09-14: 1000 mg via ORAL
  Filled 2022-09-14: qty 2

## 2022-09-14 NOTE — Discharge Instructions (Signed)
Use the gel as prescribed. Also take tylenol 1000mg (2 extra strength) four times a day.   Follow up with your cardiologist.

## 2022-09-14 NOTE — ED Triage Notes (Signed)
Pt had an angioplasty done last Friday on left leg, but entered into the right femoral artery. Pt has 3+ swelling of right upper leg. Pt has a large area of ecchymosis on right upper leg. Pt has 1+ right posterior tibial, cap refill less than 3 sec, warm to touch, pt able to move foot Pt states has numbness and tingling in toes bilat.

## 2022-09-14 NOTE — ED Provider Notes (Signed)
[place] [place] Provider Note   CSN: [id number] Arrival date & time: 09/14/22  0900     History  Chief Complaint  Patient presents with   Post-op Problem    Sharon A Odis LusterBowers is a 57 y.o. female.  57 yo F with a chief complaints of right groin pain.  The patient had had a angioplasty done and has had pain at the entry site.  Unfortunately she had a death in the family and was unable to see her cardiologist in the office.  She has had persistent pain and bruising.  Felt like things had not improved and decided to come here to be evaluated today.  She is not sure if her claudication is any better postprocedure.  Says it is hard to tell because her thigh hurts so much.        Home Medications Prior to Admission medications   Medication Sig Start Date End Date Taking? Authorizing Provider  diclofenac Sodium (VOLTAREN) 1 % GEL Apply 4 g topically 4 (four) times daily. 09/14/22  Yes Melene PlanFloyd, Manpreet Kemmer, DO  amLODipine (NORVASC) 10 MG tablet TAKE ONE TABLET BY MOUTH DAILY 06/06/22   Grayce SessionsEdwards, Michelle P, NP  aspirin (ASPIRIN CHILDRENS) 81 MG chewable tablet Chew 1 tablet (81 mg total) by mouth daily. 08/06/22   Yates DecampGanji, Jay, MD  atorvastatin (LIPITOR) 20 MG tablet Take 1 tablet (20 mg total) by mouth daily. Patient taking differently: Take 20 mg by mouth daily in the afternoon. 06/06/22   Grayce SessionsEdwards, Michelle P, NP  buPROPion (WELLBUTRIN SR) 150 MG 12 hr tablet Take 1 tablet (150 mg total) by mouth 2 (two) times daily. Morning and late afternoon 08/06/22   Yates DecampGanji, Jay, MD  busPIRone (BUSPAR) 5 MG tablet Take 1 tablet (5 mg total) by mouth 2 (two) times daily. 07/16/22   Grayce SessionsEdwards, Michelle P, NP  cilostazol (PLETAL) 100 MG tablet Take 1 tablet (100 mg total) by mouth 2 (two) times daily. 08/06/22   Yates DecampGanji, Jay, MD  clopidogrel (PLAVIX) 75 MG tablet Take 1 tablet (75 mg total) by mouth daily. 08/16/22   Yates DecampGanji, Jay, MD  gabapentin (NEURONTIN) 100 MG capsule TAKE 1 CAPSULE(100 MG)  BY MOUTH THREE TIMES DAILY 09/03/22   Grayce SessionsEdwards, Michelle P, NP  HYDROcodone-acetaminophen (NORCO/VICODIN) 5-325 MG tablet Take 1 tablet by mouth every 6 (six) hours as needed for severe pain. 08/13/22   Zadie RhineWickline, Donald, MD  ibuprofen (ADVIL) 800 MG tablet Take 800 mg by mouth every 8 (eight) hours as needed for moderate pain or mild pain.    [provider]  losartan-hydrochlorothiazide (HYZAAR) 100-25 MG tablet Take 1 tablet by mouth daily. 06/06/22   Grayce SessionsEdwards, Michelle P, NP  nicotine (NICODERM CQ) 21 mg/24hr patch Place 1 patch (21 mg total) onto the skin daily. 08/06/22   Yates DecampGanji, Jay, MD  ondansetron (ZOFRAN-ODT) 8 MG disintegrating tablet Take 1 tablet (8 mg total) by mouth every 8 (eight) hours as needed. 08/13/22   Zadie RhineWickline, Donald, MD  Vitamin D, Ergocalciferol, (DRISDOL) 1.25 MG (50000 UNIT) CAPS capsule Take 1 capsule (50,000 Units total) by mouth every 7 (seven) days. Patient taking differently: Take 50,000 Units by mouth every Monday. 08/08/22   Grayce SessionsEdwards, Michelle P, NP      Allergies    Patient has no known allergies.    Review of Systems   Review of Systems  Physical Exam Updated Vital Signs BP 116/64   Pulse 66   Temp 98 F (36.7 C) (Oral)  Resp 18   Ht 5\' 8"  (1.727 m)   Wt 128.7 kg   LMP 01/12/2015   SpO2 98%   BMI 43.14 kg/m  Physical Exam Vitals and nursing note reviewed.  Constitutional:      General: She is not in acute distress.    Appearance: She is well-developed. She is not diaphoretic.  HENT:     Head: Normocephalic and atraumatic.  Eyes:     Pupils: Pupils are equal, round, and reactive to light.  Cardiovascular:     Rate and Rhythm: Normal rate and regular rhythm.     Heart sounds: No murmur heard.    No friction rub. No gallop.  Pulmonary:     Effort: Pulmonary effort is normal.     Breath sounds: No wheezing or rales.  Abdominal:     General: There is no distension.     Palpations: Abdomen is soft.     Tenderness: There is no abdominal  tenderness.  Musculoskeletal:        General: Swelling and tenderness present.     Cervical back: Normal range of motion and neck supple.     Comments: Pain and swelling to the right thigh.  She has some pain over the right femoral triangle.  No obvious erythema breakdown of the skin fluctuance.  She has some bruising both laterally and medial to that area.  Mostly along the medial aspect.  Intact pulse motor and sensation to bilateral lower extremities.  Skin:    General: Skin is warm and dry.  Neurological:     Mental Status: She is alert and oriented to person, place, and time.  Psychiatric:        Behavior: Behavior normal.     ED Results / Procedures / Treatments   Labs (all labs ordered are listed, but only abnormal results are displayed) Labs Reviewed  CBC WITH DIFFERENTIAL/PLATELET - Abnormal; Notable for the following components:      Result Value   RBC 3.38 (*)    Hemoglobin 10.4 (*)    HCT 31.8 (*)    All other components within normal limits  BASIC METABOLIC PANEL - Abnormal; Notable for the following components:   Potassium 3.4 (*)    Glucose, Bld 100 (*)    Calcium 8.6 (*)    All other components within normal limits    EKG None  Radiology VAS [place] GROIN PSEUDOANEURYSM  Result Date: 09/14/2022  ARTERIAL PSEUDOANEURYSM  Patient Name:  Sharon Dyer  Date of Exam:   09/14/2022 Medical Rec #: [id number]       Accession #:    [id number] Date of Birth: 21-Mar-1966       Patient Gender: F Patient Age:   28 years Exam Location:  [facility] Procedure:      VAS [place] [name] Referring Phys: Melvin Marmo --------------------------------------------------------------------------------  Exam: Right groin Indications: Patient complains of groin pain and bruising. History: Status post peripheral vascular atherectomy 09/06/22 and abdominal aortogram 08/16/22. Limitations: Pain. swelling, body habitus Comparison Study: No prior study on file Performing Technologist: Sherren Kerns RVS  Examination Guidelines: A complete evaluation includes B-mode imaging, spectral Doppler, color Doppler, and power Doppler as needed of all accessible portions of each vessel. Bilateral testing is considered an integral part of a complete examination. Limited examinations for reoccurring indications may be performed as noted. +------------+----------+--------+------+----------+ Right DuplexPSV (cm/s)WaveformPlaqueComment(s) +------------+----------+--------+------+----------+ CFA             97                             +------------+----------+--------+------+----------+  Findings: 2 mixed echogenic structures measuring approximately 1.6 cm x 0.9 cm and 3.62 cm X 0.99 cm are visualized at the Groin with ultrasound characteristics of hematoma.  Summary: No evidence of pseudoaneurysm, AVF or DVT     --------------------------------------------------------------------------------    Preliminary     Procedures Procedures    Medications Ordered in ED Medications  acetaminophen (TYLENOL) tablet 1,000 mg (1,000 mg Oral Given 09/14/22 0923)  oxyCODONE (Oxy IR/ROXICODONE) immediate release tablet 5 mg (5 mg Oral Given 09/14/22 [id number])    ED Course/ Medical Decision Making/ A&P                             Medical Decision Making Amount and/or Complexity of Data Reviewed Labs: ordered.  Risk OTC drugs. Prescription drug management.   57 yo F with a chief complaints of right groin pain.  The patient had a angioplasty done on 29 March.  Unfortunately has had pain in the surgical site since then.  On my record review I can see multiple cardiology phone calls where they had discussed perhaps getting a ultrasound done as an outpatient.  She is here today because of persistent symptoms.  Will obtain a pseudoaneurysm ultrasound.  Reassess.  Ultrasound with 2 large hematomas but no pseudoaneurysm.  I discussed pain management at home with the patient.  Will have her follow-up with her  cardiologist.  12:37 PM:  I have discussed the diagnosis/risks/treatment options with the patient and family.  Evaluation and diagnostic testing in the emergency department does not suggest an emergent condition requiring admission or immediate intervention beyond what has been performed at this time.  They will follow up with Cards. We also discussed returning to the ED immediately if new or worsening sx occur. We discussed the sx which are most concerning (e.g., sudden worsening pain, fever, inability to tolerate by mouth) that necessitate immediate return. Medications administered to the patient during their visit and any new prescriptions provided to the patient are listed below.  Medications given during this visit Medications  acetaminophen (TYLENOL) tablet 1,000 mg (1,000 mg Oral Given 09/14/22 0923)  oxyCODONE (Oxy IR/ROXICODONE) immediate release tablet 5 mg (5 mg Oral Given 09/14/22 [id number])     The patient appears reasonably screen and/or stabilized for discharge and I doubt any other medical condition or other [facility] requiring further screening, evaluation, or treatment in the ED at this time prior to discharge.          Final Clinical Impression(s) / ED Diagnoses Final diagnoses:  Post-operative pain    Rx / DC Orders ED Discharge Orders          Ordered    diclofenac Sodium (VOLTAREN) 1 % GEL  4 times daily        09/14/22 1101              EversonFloyd, Denetta Fei, DO 09/14/22 1237

## 2022-09-14 NOTE — Progress Notes (Signed)
VASCULAR LAB    Right groin duplex has been performed.  See CV proc for preliminary results.  Messaged results to Dr. Adela Lank via secure chat  Sherren Kerns, RVT 09/14/2022, 10:47 AM

## 2022-09-17 ENCOUNTER — Ambulatory Visit: Payer: Medicaid Other | Admitting: Cardiology

## 2022-09-17 ENCOUNTER — Encounter: Payer: Self-pay | Admitting: Cardiology

## 2022-09-17 VITALS — BP 121/78 | HR 78 | Resp 16 | Ht 68.0 in | Wt 294.0 lb

## 2022-09-17 DIAGNOSIS — E7800 Pure hypercholesterolemia, unspecified: Secondary | ICD-10-CM

## 2022-09-17 DIAGNOSIS — I739 Peripheral vascular disease, unspecified: Secondary | ICD-10-CM

## 2022-09-17 DIAGNOSIS — I10 Essential (primary) hypertension: Secondary | ICD-10-CM

## 2022-09-17 MED ORDER — RYBELSUS 3 MG PO TABS
1.0000 | ORAL_TABLET | Freq: Every day | ORAL | 0 refills | Status: DC
Start: 2022-09-17 — End: 2022-12-17

## 2022-09-17 NOTE — Progress Notes (Signed)
Primary Physician/Referring:  Grayce Sessions, NP  Patient ID: Sharon Dyer, female    DOB: 1966/02/21, 57 y.o.   MRN: [id number]  Chief Complaint  Patient presents with   PAD        Follow-up    6 weeks   HPI:    Sharon Dyer  is a 57 y.o. African-American female patient with hypertension, hyperlipidemia, tobacco use disorder, obesity presents for follow-up of peripheral arterial disease.  Patient underwent peripheral arteriogram and right SFA atherectomy followed by drug-coated balloon angioplasty on 08/15/2020 and repeat procedure on 09/06/2022 for the long CTO left SFA again with directional atherectomy followed by drug-coated balloon angioplasty.  Patient had right groin hematoma following the second procedure needing overnight stay in the hospital and discharged home and outpatient Doppler had revealed no evidence of pseudoaneurysm but small hematoma.  She now presents for follow-up.  She has completely quit smoking, however her mother passed away 4 days ago and she has smoked about 3 cigarettes but is willing to completely quit.  She has gained about 10 pounds in weight since quitting smoking.  Except for pain in the groin area on the right, occasional pain at the SFA angioplasty site, states that she is doing well.  She has not resumed all her activities due to death in the family and also pain in the right groin.  Past Medical History:  Diagnosis Date   Anxiety    Arthritis    Patient verbalizes she has athritis to right ankle   Dyspnea    Frozen shoulder syndrome 07/30/2019   Hyperlipidemia    Hypertension    Obesity    PAD (peripheral artery disease)    Past Surgical History:  Procedure Laterality Date   ABDOMINAL AORTOGRAM W/LOWER EXTREMITY Bilateral 08/16/2022   Procedure: ABDOMINAL AORTOGRAM W/LOWER EXTREMITY;  Surgeon: Yates Decamp, MD;  Location: [facility] INVASIVE CV LAB;  Service: Cardiovascular;  Laterality: Bilateral;   ABDOMINAL AORTOGRAM W/LOWER EXTREMITY N/A  09/06/2022   Procedure: ABDOMINAL AORTOGRAM W/LOWER EXTREMITY;  Surgeon: Yates Decamp, MD;  Location: [facility] INVASIVE CV LAB;  Service: Cardiovascular;  Laterality: N/A;   ECTOPIC PREGNANCY SURGERY  X 2   PERIPHERAL VASCULAR ATHERECTOMY Right 08/16/2022   Procedure: PERIPHERAL VASCULAR ATHERECTOMY;  Surgeon: Yates Decamp, MD;  Location: [facility] INVASIVE CV LAB;  Service: Cardiovascular;  Laterality: Right;  R SFA   PERIPHERAL VASCULAR ATHERECTOMY  09/06/2022   Procedure: PERIPHERAL VASCULAR ATHERECTOMY;  Surgeon: Yates Decamp, MD;  Location: [facility] INVASIVE CV LAB;  Service: Cardiovascular;;   PERIPHERAL VASCULAR BALLOON ANGIOPLASTY  08/16/2022   Procedure: PERIPHERAL VASCULAR BALLOON ANGIOPLASTY;  Surgeon: Yates Decamp, MD;  Location: [facility] INVASIVE CV LAB;  Service: Cardiovascular;;   PERIPHERAL VASCULAR INTERVENTION  09/06/2022   Procedure: PERIPHERAL VASCULAR INTERVENTION;  Surgeon: Yates Decamp, MD;  Location: [facility] INVASIVE CV LAB;  Service: Cardiovascular;;   Family History  Problem Relation Age of Onset   Breast cancer Mother        in 79s   Hypertension Mother    Stroke Mother     Social History   Tobacco Use   Smoking status: Some Days    Packs/day: 0.15    Years: 37.00    Additional pack years: 0.00    Total pack years: 5.55    Types: Cigarettes    Start date: 48   Smokeless tobacco: Never   Tobacco comments:    Started on Wellbutrin for depression  Substance Use Topics   Alcohol use: Not Currently  Comment: 12/22/2017 "maybe twice a year"   Marital Status: Divorced  ROS  Review of Systems  Constitutional: Positive for weight gain.  Cardiovascular:  Positive for claudication. Negative for chest pain, dyspnea on exertion and leg swelling.   Objective      09/17/2022    8:49 AM 09/14/2022   10:15 AM 09/14/2022   10:00 AM  Vitals with BMI  Height 5\' 8"     Weight 294 lbs    BMI 44.71    Systolic 121 116 [id number]  Diastolic 78 64 63  Pulse 78 66 65   SpO2: 98 %  Physical Exam Constitutional:       Appearance: She is obese.  Neck:     Vascular: No carotid bruit or JVD.  Cardiovascular:     Rate and Rhythm: Normal rate and regular rhythm.     Pulses:          Femoral pulses are 1+ on the right side and 1+ on the left side.      Popliteal pulses are 1+ on the right side and 1+ on the left side.       Dorsalis pedis pulses are 0 on the right side and 0 on the left side.       Posterior tibial pulses are 0 on the right side and 0 on the left side.     Heart sounds: Normal heart sounds. No murmur heard.    No gallop.     Comments: Right groin ecchymosis present.  No bruit. Pulmonary:     Effort: Pulmonary effort is normal.     Breath sounds: Normal breath sounds.  Abdominal:     General: Bowel sounds are normal.     Palpations: Abdomen is soft.  Musculoskeletal:     Right lower leg: No edema.     Left lower leg: No edema.     Laboratory examination:   Recent Labs    09/06/22 0645 09/07/22 0118 09/14/22 0946  NA 140 140 137  K 3.6 3.7 3.4*  CL 104 106 104  CO2 27 26 24   GLUCOSE 94 107* 100*  BUN 14 17 11   CREATININE 0.95 0.92 0.84  CALCIUM 9.3 8.6* 8.6*  GFRNONAA >60 >60 >60    Lab Results  Component Value Date   GLUCOSE 100 (H) 09/14/2022   NA 137 09/14/2022   K 3.4 (L) 09/14/2022   CL 104 09/14/2022   CO2 24 09/14/2022   BUN 11 09/14/2022   CREATININE 0.84 09/14/2022   EGFR 77 08/06/2022   CALCIUM 8.6 (L) 09/14/2022   PROT 6.6 08/13/2022   ALBUMIN 3.2 (L) 08/13/2022   LABGLOB 3.1 04/15/2022   AGRATIO 1.3 04/15/2022   BILITOT 0.3 08/13/2022   ALKPHOS 61 08/13/2022   AST 39 08/13/2022   ALT 25 08/13/2022   ANIONGAP 9 09/14/2022      Lab Results  Component Value Date   ALT 25 08/13/2022   AST 39 08/13/2022   ALKPHOS 61 08/13/2022   BILITOT 0.3 08/13/2022       Latest Ref Rng & Units 08/13/2022    4:09 AM 04/15/2022   10:52 AM 10/12/2021   10:49 PM  Hepatic Function  Total Protein 6.5 - 8.1 g/dL 6.6  7.2  7.0   Albumin 3.5 - 5.0 g/dL 3.2   4.1  3.7   AST 15 - 41 U/L 39  23  17   ALT 0 - 44 U/L 25  13  11    Alk  Phosphatase 38 - 126 U/L 61  72  56   Total Bilirubin 0.3 - 1.2 mg/dL 0.3  0.3  0.6     Lipid Panel Recent Labs    04/15/22 1052  CHOL 185  TRIG 99  LDLCALC 126*  HDL 41  CHOLHDL 4.5*    HEMOGLOBIN A1C Lab Results  Component Value Date   HGBA1C 5.9 (A) 02/05/2022   Lab Results  Component Value Date   TSH 1.240 02/05/2019    Last vitamin D Lab Results  Component Value Date   VD25OH 5.4 (L) 08/06/2022     Radiology:   Chest x-ray two-view 01/21/2019: The heart size and mediastinal contours are within normal limits. Both lungs are clear. Mild degenerative changes of the spine.  Ultrasound of the abdomen 08/11/2021: 1. Study is positive for cholelithiasis. No findings to suggest an acute cholecystitis at this time. 2.  Hepatic steatosis  Cardiac Studies:   Echocardiogram 07/25/2022:  12/23/2017  Left ventricle: The cavity size was normal. Wall thickness was   increased in a pattern of mild LVH. There was mild focal basal   hypertrophy of the septum. Systolic function was normal. The   estimated ejection fraction was in the range of 55% to 60%. Wall   motion was normal; there were no regional wall motion  abnormalities. Doppler parameters are consistent with abnormal   left ventricular relaxation (grade 1 diastolic dysfunction).   Doppler parameters are consistent with high ventricular filling pressure. - Aortic valve: There was mild regurgitation. - Right ventricle: The cavity size was mildly dilated. - Pulmonary arteries: Systolic pressure was moderately to severely  increased. PA peak pressure: 58 mm Hg (S).  Lower Extremity Arterial Duplex 07/23/2022: Right: 75-99% stenosis noted in the superficial femoral artery.  Left: Total occlusion noted in the superficial femoral artery.  ABI: Right 0.73. TBI 0.52 left 0.68. TBI 0.52  PCV MYOCARDIAL PERFUSION WITH LEXISCAN 08/13/2022    Narrative Lexiscan Tetrofosmin stress test 08/13/2022: Lexiscan nuclear stress test performed using 1-day protocol. SPECT images showed small sized, mild intensity, reversible perfusion defect in basal inferior myocardium.  Normal myocardial wall motion and thickening. Stress LVEF 60%. Low risk study.  Peripheral arteriogram and angioplasty 08/16/2022 & 09/02/2022: Abdominal aorta gram reveals presence of 2 renal arteries 1 on either side, widely patent.  There is mild atherosclerotic changes of the mid and distal abdominal aorta.  Distal abdominal aorta has a small aneurysmal dilatation.  Aorta iliac bifurcation is widely patent. Right iliac artery is tortuous.  There is a focal diffuse 80% stenosis of the right mid SFA.  Right distal popliteal and tibioperoneal trunk has focal 80% diffuse stenosis.  Three-vessel runoff below the right leg.   Left iliac artery is widely patent.  Left SFA is occluded at its origin from the proximal segment all the way to the midsegment.  Profunda gives collaterals.  Left below-knee vessels not well-visualized due to poor contrast flow.       Right SFA: HawkOne followed by drug-coated balloon angioplasty with 6.0 x 150 mm InPact Admiral balloon  Left SFA HawkOne followed by [facility] angioplasty with 6.0 x 250 mm In.Pact Admiral balloon    EKG:   EKG 08/06/2022: Sinus rhythm with borderline first-degree AV block at the rate of 66 bpm, normal axis, incomplete right bundle branch block.  Poor R progression, cannot exclude anteroseptal infarct old.  EKG 08/06/2019: Normal sinus rhythm at rate of 89 bpm, low voltage complexes.   Medications and allergies  No  Known Allergies    Current Outpatient Medications:    amLODipine (NORVASC) 10 MG tablet, TAKE ONE TABLET BY MOUTH DAILY, Disp: 90 tablet, Rfl: 1   aspirin (ASPIRIN CHILDRENS) 81 MG chewable tablet, Chew 1 tablet (81 mg total) by mouth daily., Disp: 90 tablet, Rfl: 1   atorvastatin (LIPITOR) 20 MG tablet, Take  1 tablet (20 mg total) by mouth daily. (Patient taking differently: Take 20 mg by mouth daily in the afternoon.), Disp: 90 tablet, Rfl: 1   buPROPion (WELLBUTRIN SR) 150 MG 12 hr tablet, Take 1 tablet (150 mg total) by mouth 2 (two) times daily. Morning and late afternoon, Disp: 60 tablet, Rfl: 2   busPIRone (BUSPAR) 5 MG tablet, Take 1 tablet (5 mg total) by mouth 2 (two) times daily., Disp: 60 tablet, Rfl: 2   clopidogrel (PLAVIX) 75 MG tablet, Take 1 tablet (75 mg total) by mouth daily., Disp: 30 tablet, Rfl: 1   diclofenac Sodium (VOLTAREN) 1 % GEL, Apply 4 g topically 4 (four) times daily., Disp: 100 g, Rfl: 0   gabapentin (NEURONTIN) 100 MG capsule, TAKE 1 CAPSULE(100 MG) BY MOUTH THREE TIMES DAILY, Disp: 270 capsule, Rfl: 1   HYDROcodone-acetaminophen (NORCO/VICODIN) 5-325 MG tablet, Take 1 tablet by mouth every 6 (six) hours as needed for severe pain., Disp: 10 tablet, Rfl: 0   ibuprofen (ADVIL) 800 MG tablet, Take 800 mg by mouth every 8 (eight) hours as needed for moderate pain or mild pain., Disp: , Rfl:    losartan-hydrochlorothiazide (HYZAAR) 100-25 MG tablet, Take 1 tablet by mouth daily., Disp: 90 tablet, Rfl: 1   nicotine (NICODERM CQ) 21 mg/24hr patch, Place 1 patch (21 mg total) onto the skin daily., Disp: 28 patch, Rfl: 1   ondansetron (ZOFRAN-ODT) 8 MG disintegrating tablet, Take 1 tablet (8 mg total) by mouth every 8 (eight) hours as needed., Disp: 8 tablet, Rfl: 0   Semaglutide (RYBELSUS) 3 MG TABS, Take 1 tablet (3 mg total) by mouth daily., Disp: 30 tablet, Rfl: 0   Vitamin D, Ergocalciferol, (DRISDOL) 1.25 MG (50000 UNIT) CAPS capsule, Take 1 capsule (50,000 Units total) by mouth every 7 (seven) days. (Patient taking differently: Take 50,000 Units by mouth every Monday.), Disp: 12 capsule, Rfl: 0   Assessment     ICD-10-CM   1. Claudication in peripheral vascular disease  I73.9     2. Primary hypertension  I10     3. Hypercholesteremia  E78.00 Lipid Panel With LDL/HDL  Ratio    Lipoprotein A (LPA)    4. Class 3 severe obesity due to excess calories with serious comorbidity and body mass index (BMI) of 40.0 to 44.9 in adult  E66.01 Semaglutide (RYBELSUS) 3 MG TABS   Z68.41        Orders Placed This Encounter  Procedures   Lipid Panel With LDL/HDL Ratio   Lipoprotein A (LPA)   Meds ordered this encounter  Medications   Semaglutide (RYBELSUS) 3 MG TABS    Sig: Take 1 tablet (3 mg total) by mouth daily.    Dispense:  30 tablet    Refill:  0   Medications Discontinued During This Encounter  Medication Reason   cilostazol (PLETAL) 100 MG tablet Completed Course      Recommendations:   Sharon Dyer is a 57 y.o. African-American female patient with hypertension, hyperlipidemia, tobacco use disorder, obesity presents for follow-up of peripheral arterial disease.  Patient underwent peripheral arteriogram and right SFA atherectomy followed by drug-coated balloon angioplasty on 08/15/2020  and repeat procedure on 09/06/2022 for the long CTO left SFA again with directional atherectomy followed by drug-coated balloon angioplasty.  1. Claudication in peripheral vascular disease I expect patient symptoms of claudication should improve significantly.  She still has residual disease in the right popliteal artery but will attempt angioplasty only if symptoms of claudication are severe or for limb threatening ischemia.  She has had excellent results with regard to the SFA angioplasty bilaterally.  She did have a small hematoma but no pseudoaneurysm.  2. Primary hypertension Blood pressure is well-controlled.  She is presently on losartan.  3. Hypercholesteremia Presently tolerating 20 mg of atorvastatin, she will need lipid profile testing, orders were placed today.  4. Class 3 severe obesity due to excess calories with serious comorbidity and body mass index (BMI) of 40.0 to 44.9 in adult Class III obesity with significant comorbidity, I will try Rybelsus,  samples given.  She will start this after her cholecystectomy that is scheduled for April 27.  She can certainly come back to our office to see if she have samples especially if she is losing weight.   She is aware to discontinue cilostazol 1 week before and to hold Plavix for 5 days and to continue aspirin periprocedurally.  She will restart Plavix postprocedure.  I would like to see her back in 3 months for follow-up.  ABI has been scheduled.  Her right groin site has healed well she does have ecchymosis.  No bruit.  Poking sensation and abstinence discussed again.     Yates Decamp, MD, [facility] 09/17/2022, 9:39 AM Office: [phone]

## 2022-09-18 LAB — LIPID PANEL WITH LDL/HDL RATIO
Cholesterol, Total: 142 mg/dL (ref 100–199)
HDL: 52 mg/dL (ref >39)
LDL Chol Calc (NIH): 75 mg/dL (ref 0–99)
LDL/HDL Ratio: 1.4 ratio (ref 0.0–3.2)
Triglycerides: 79 mg/dL (ref 0–149)
VLDL Cholesterol Cal: 15 mg/dL (ref 5–40)

## 2022-09-18 LAB — LIPOPROTEIN A (LPA): Lipoprotein (a): 76.4 nmol/L — ABNORMAL HIGH (ref <75.0)

## 2022-09-18 NOTE — Progress Notes (Signed)
Sent message, via epic in basket, requesting orders in epic from surgeon.  

## 2022-09-20 ENCOUNTER — Ambulatory Visit: Payer: Medicaid Other

## 2022-09-20 ENCOUNTER — Other Ambulatory Visit (HOSPITAL_COMMUNITY): Payer: Self-pay

## 2022-09-20 DIAGNOSIS — Z9862 Peripheral vascular angioplasty status: Secondary | ICD-10-CM

## 2022-09-20 DIAGNOSIS — I739 Peripheral vascular disease, unspecified: Secondary | ICD-10-CM

## 2022-09-22 ENCOUNTER — Other Ambulatory Visit: Payer: Self-pay | Admitting: Cardiology

## 2022-09-22 MED ORDER — CLOPIDOGREL BISULFATE 75 MG PO TABS: 75.0000 mg | ORAL_TABLET | Freq: Every day | ORAL | 1 refills | Status: DC

## 2022-09-22 NOTE — Progress Notes (Signed)
ABI 09/20/2022: This exam reveals normal perfusion of the right lower extremity (ABI 1.22) With mildly abnormal biphasic waveform pattern at the ankle.   This exam reveals mildly decreased perfusion of the left lower extremity, noted at the dorsalis pedis artery level (ABI 0.91) With mildly abnormal biphasic waveform pattern at the ankle.  Compared to 07/23/2022, right ABI has improved from 0.73 and left ABI from 0.52. This represents successful revascularization of bilateral SFA  Patient is aware

## 2022-09-23 NOTE — Progress Notes (Signed)
Second request for pre op orders in [facility]: Spoke with Nicole at [facility].  

## 2022-09-24 ENCOUNTER — Ambulatory Visit: Payer: Self-pay | Admitting: Surgery

## 2022-09-24 NOTE — Progress Notes (Signed)
COVID Vaccine received:   No  Yes Date of any COVID positive Test in last 90 days:  PCP - Gwinda Passe, NP,  Ralene Ok, MD Cardiologist - Yates Decamp, MD Cardiac clearance 09-17-22 note  Chest x-ray - 01-21-2019  2v  Epic EKG - 08-06-2022 Epic  Stress Test - Eugenie Birks 08-13-2022 Epic ECHO - 12-23-2017   Epic Cardiac Cath -   PCR screen:  Ordered & Completed             No Order but Needs PROFEND             N/A for this surgery  Surgery Plan:   Ambulatory                             Outpatient in bed                             Admit  Anesthesia:     General   Spinal                             Choice   MAC  Bowel Prep -  No    Yes ______  Pacemaker / ICD device  No  Yes   Spinal Cord Stimulator:[x]  No  Yes       History of Sleep Apnea?  No  Yes   CPAP used?-  No  Yes    Does the patient monitor blood sugar?           No  Yes   N/A  Patient has:  NO Hx DM    Pre-DM                  DM1    DM2 Does patient have a [employer] or Dexacom?  No  Yes   Fasting Blood Sugar Ranges-  Checks Blood Sugar _____ times a day  GLP1 agonist / usual dose - Rybelsus (hasn't started yet, on Hold until after surgery) GLP1 instructions:   Blood Thinner / Instructions: Plavix- hold 5 days,per Dr. Jacinto Halim Aspirin Instructions:continue the ASA 81 mg  ERAS Protocol Ordered:  No   Yes PRE-SURGERY  ENSURE   G2   No Drink Ordered Patient is to be NPO after: 12:15 pm  Comments: Recent abd. Aortogram w/ atherectomy by Dr. Jacinto Halim 09-06-22. Patient still has hematoma but did not have a pseudoaneurysm per scan.   Activity level: Patient is able / unable to climb a flight of stairs without difficulty;  No CP   No SOB, but would have ___   Patient can / can not perform ADLs without assistance.   Anesthesia review: HTN, anxiety, 1 AV block, Poor R wave progression. PAD-claudication, some days smoker, Pre-DM,  anemia  Patient denies shortness of breath, fever, cough and chest pain at PAT appointment.  Patient verbalized understanding and agreement to the Pre-Surgical Instructions that were given to them at this PAT appointment. Patient was also educated of the need to review these PAT instructions again prior to her surgery.I reviewed the appropriate phone numbers to call if they have any and questions or concerns.

## 2022-09-24 NOTE — Patient Instructions (Signed)
SURGICAL WAITING ROOM VISITATION Patients having surgery or a procedure may have no more than 2 support people in the waiting area - these visitors may rotate in the visitor waiting room.   Due to an increase in RSV and influenza rates and associated hospitalizations, children ages 64 and under may not visit patients in [facility] hospitals. If the patient needs to stay at the hospital during part of their recovery, the visitor guidelines for inpatient rooms apply.  PRE-OP VISITATION  Pre-op nurse will coordinate an appropriate time for 1 support person to accompany the patient in pre-op.  This support person may not rotate.  This visitor will be contacted when the time is appropriate for the visitor to come back in the pre-op area.  Please refer to the [facility] website for the visitor guidelines for Inpatients (after your surgery is over and you are in a regular room).  You are not required to quarantine at this time prior to your surgery. However, you must do this: Hand Hygiene often Do NOT share personal items Notify your provider if you are in close contact with someone who has COVID or you develop fever 100.4 or greater, new onset of sneezing, cough, sore throat, shortness of breath or body aches.  If you test positive for Covid or have been in contact with anyone that has tested positive in the last 10 days please notify you surgeon.    Your procedure is scheduled on:  Monday September 30, 2022  Report to [facility] Main Entrance: [place] entrance where the [employer] is available.   Report to admitting at: 1:00 PM  +++++Call this number if you have any questions or problems the morning of surgery [phone]  Do not eat food after Midnight the night prior to your surgery/procedure.  After Midnight you may have the following liquids until  12:15  PM DAY OF SURGERY  Clear Liquid Diet Water Black Coffee (sugar ok, NO MILK/CREAM OR CREAMERS)  Tea (sugar ok, NO  MILK/CREAM OR CREAMERS) regular and decaf                             Plain Jell-O  with no fruit (NO RED)                                           Fruit ices (not with fruit pulp, NO RED)                                     Popsicles (NO RED)                                                                  Juice: apple, WHITE grape, WHITE cranberry Sports drinks like Gatorade or Powerade (NO RED)               FOLLOW BOWEL PREP AND ANY ADDITIONAL PRE OP INSTRUCTIONS YOU RECEIVED FROM YOUR SURGEON'S OFFICE!!!   Oral Hygiene is also important to reduce your risk of infection.  Remember - BRUSH YOUR TEETH THE MORNING OF SURGERY WITH YOUR REGULAR TOOTHPASTE  Do NOT smoke after Midnight the night before surgery.  Take ONLY these medicines the morning of surgery with A SIP OF WATER: amlodipine, bupropion (Wellbutrin), gabapentin, Buspar. You may take Hydrocodone if needed for pain                You may not have any metal on your body including hair pins, jewelry, and body piercing  Do not wear make-up, lotions, powders, perfumes or deodorant  Do not wear nail polish including gel and S&S, artificial / acrylic nails, or any other type of covering on natural nails including finger and toenails. If you have artificial nails, gel coating, etc., that needs to be removed by a nail salon, Please have this removed prior to surgery. Not doing so may mean that your surgery could be cancelled or delayed if the Surgeon or anesthesia staff feels like they are unable to monitor you safely.   Do not shave 48 hours prior to surgery to avoid nicks in your skin which may contribute to postoperative infections.   Contacts, Hearing Aids, dentures or bridgework may not be worn into surgery. DENTURES WILL BE REMOVED PRIOR TO SURGERY PLEASE DO NOT APPLY "Poly grip" OR ADHESIVES!!!  You may bring a small overnight bag with you on the day of surgery, only pack items that are not valuable. [facility] IS NOT  RESPONSIBLE   FOR VALUABLES THAT ARE LOST OR STOLEN.   Patients discharged on the day of surgery will not be allowed to drive home.  Someone NEEDS to stay with you for the first 24 hours after anesthesia.  Do not bring your home medications to the hospital. The Pharmacy will dispense medications listed on your medication list to you during your admission in the Hospital.  Please read over the following fact sheets you were given: IF YOU HAVE QUESTIONS ABOUT YOUR PRE-OP INSTRUCTIONS, PLEASE CALL [phone].   [place] - Preparing for Surgery Before surgery, you can play an important role.  Because skin is not sterile, your skin needs to be as free of germs as possible.  You can reduce the number of germs on your skin by washing with CHG (chlorahexidine gluconate) soap before surgery.  CHG is an antiseptic cleaner which kills germs and bonds with the skin to continue killing germs even after washing. Please DO NOT use if you have an allergy to CHG or antibacterial soaps.  If your skin becomes reddened/irritated stop using the CHG and inform your nurse when you arrive at Short Stay. Do not shave (including legs and underarms) for at least 48 hours prior to the first CHG shower.  You may shave your face/neck.  Please follow these instructions carefully:  1.  Shower with CHG Soap the night before surgery and the  morning of surgery.  2.  If you choose to wash your hair, wash your hair first as usual with your normal  shampoo.  3.  After you shampoo, rinse your hair and body thoroughly to remove the shampoo.                             4.  Use CHG as you would any other liquid soap.  You can apply chg directly to the skin and wash.  Gently with a scrungie or clean washcloth.  5.  Apply the CHG Soap to your body ONLY FROM  THE NECK DOWN.   Do not use on face/ open                           Wound or open sores. Avoid contact with eyes, ears mouth and genitals (private parts).                        Wash face,  Genitals (private parts) with your normal soap.             6.  Wash thoroughly, paying special attention to the area where your  surgery  will be performed.  7.  Thoroughly rinse your body with warm water from the neck down.  8.  DO NOT shower/wash with your normal soap after using and rinsing off the CHG Soap.            9.  Pat yourself dry with a clean towel.            10.  Wear clean pajamas.            11.  Place clean sheets on your bed the night of your first shower and do not  sleep with pets.  ON THE DAY OF SURGERY : Do not apply any lotions/deodorants the morning of surgery.  Please wear clean clothes to the hospital/surgery center.    FAILURE TO FOLLOW THESE INSTRUCTIONS MAY RESULT IN THE CANCELLATION OF YOUR SURGERY  PATIENT SIGNATURE_________________________________  NURSE SIGNATURE__________________________________  ________________________________________________________________________

## 2022-09-25 ENCOUNTER — Other Ambulatory Visit: Payer: Self-pay

## 2022-09-25 ENCOUNTER — Encounter (HOSPITAL_COMMUNITY): Payer: Self-pay

## 2022-09-25 ENCOUNTER — Encounter (HOSPITAL_COMMUNITY)
Admission: RE | Admit: 2022-09-25 | Discharge: 2022-09-25 | Disposition: A | Discharge status: Home / Self Care | Payer: Medicaid Other | Source: Ambulatory Visit | Attending: Surgery | Admitting: Surgery

## 2022-09-25 VITALS — BP 117/62 | HR 77 | Temp 97.9°F | Resp 18 | Ht 68.0 in | Wt 294.0 lb

## 2022-09-25 DIAGNOSIS — R7303 Prediabetes: Secondary | ICD-10-CM

## 2022-09-25 DIAGNOSIS — I739 Peripheral vascular disease, unspecified: Secondary | ICD-10-CM | POA: Insufficient documentation

## 2022-09-25 DIAGNOSIS — F17200 Nicotine dependence, unspecified, uncomplicated: Secondary | ICD-10-CM | POA: Insufficient documentation

## 2022-09-25 DIAGNOSIS — Z01812 Encounter for preprocedural laboratory examination: Secondary | ICD-10-CM | POA: Insufficient documentation

## 2022-09-25 DIAGNOSIS — K819 Cholecystitis, unspecified: Secondary | ICD-10-CM | POA: Insufficient documentation

## 2022-09-25 DIAGNOSIS — I10 Essential (primary) hypertension: Secondary | ICD-10-CM | POA: Diagnosis not present

## 2022-09-25 HISTORY — DX: Prediabetes: R73.03

## 2022-09-25 LAB — GLUCOSE, CAPILLARY: Glucose-Capillary: 97 mg/dL (ref 70–99)

## 2022-09-25 LAB — HEMOGLOBIN A1C
Hgb A1c MFr Bld: 5.3 % (ref 4.8–5.6)
Mean Plasma Glucose: 105.41 mg/dL

## 2022-09-26 NOTE — Anesthesia Preprocedure Evaluation (Addendum)
Anesthesia Evaluation  Patient identified by MRN, date of birth, ID band Patient awake    Reviewed: Allergy & Precautions, NPO status , Patient's Chart, lab work & pertinent test results  History of Anesthesia Complications Negative for: history of anesthetic complications  Airway Mallampati: III  TM Distance: >3 FB Neck ROM: Full    Dental  (+) Missing, Caps, Dental Advisory Given,    Pulmonary neg shortness of breath, neg COPD, neg recent URI, Current Smoker and Patient abstained from smoking.   breath sounds clear to auscultation       Cardiovascular hypertension, Pt. on medications (-) angina + Peripheral Vascular Disease  (-) Past MI and (-) CHF  Rhythm:Regular     Neuro/Psych  PSYCHIATRIC DISORDERS Anxiety     negative neurological ROS     GI/Hepatic negative GI ROS, Neg liver ROS,,,  Endo/Other    Morbid obesityLab Results      Component                Value               Date                      HGBA1C                   5.3                 09/25/2022             Renal/GU negative Renal ROSLab Results      Component                Value               Date                      CREATININE               0.84                09/14/2022                Musculoskeletal   Abdominal   Peds  Hematology   Anesthesia Other Findings   Reproductive/Obstetrics                              Anesthesia Physical Anesthesia Plan  ASA: 3  Anesthesia Plan: General   Post-op Pain Management: Tylenol PO (pre-op)*   Induction: Intravenous and Rapid sequence  PONV Risk Score and Plan: 3 and Ondansetron and Dexamethasone  Airway Management Planned: Oral ETT  Additional Equipment: None  Intra-op Plan:   Post-operative Plan: Extubation in OR  Informed Consent: I have reviewed the patients History and Physical, chart, labs and discussed the procedure including the risks, benefits and  alternatives for the proposed anesthesia with the patient or authorized representative who has indicated his/her understanding and acceptance.     Dental advisory given  Plan Discussed with: CRNA  Anesthesia Plan Comments: (See PAT note 09/25/2022)        Anesthesia Quick Evaluation

## 2022-09-26 NOTE — Progress Notes (Signed)
Anesthesia chart review   Case: [id number] Date/Time: 09/30/22 1500   Procedure: LAPAROSCOPIC CHOLECYSTECTOMY   Anesthesia type: General   Pre-op diagnosis: CHOLECYSTITIS   Location: [facility] ROOM 02 / [facility]   Surgeons: Quentin Ore, MD       DISCUSSION: 57 year old smoker with history of HTN, PAD peripheral arteriogram and right SFA atherectomy followed by drug-coated balloon angioplasty on 08/15/2020 and repeat procedure on 09/06/2022 for the long CTO left SFA again with directional atherectomy followed by drug-coated balloon angioplasty, cholecystitis scheduled for above procedure 09/30/2022 with Dr. Ivar Drape.   Patient last seen by cardiology 09/17/2022.  Blood pressure well-controlled.  Per office visit note, "She is aware to discontinue cilostazol 1 week before and to hold Plavix for 5 days and to continue aspirin periprocedurally " right groin site healed.  Per letter from Dr. Jacinto Halim 09/11/2022, "[name] is at low risk, from a cardiac standpoint, for her upcoming procedure: Laparoscopic cholecystectomy.  It is ok to proceed without further cardiac testing.   If applicable can hold Plavix and Pletal for 7  day(s) prior to procedure and re-start 2-3 days  days post procedure."  Anticipate pt can proceed with planned procedure barring acute status change.   VS: BP 117/62   Pulse 77   Temp 36.6 C (Oral)   Resp 18   Ht  (1.727 m)   Wt 133.4 kg   LMP 01/12/2015   SpO2 100%   BMI 44.70 kg/m   PROVIDERS: Grayce Sessions, NP is PCP  Dr. Yates Decamp is cardiologist LABS: Labs reviewed: Acceptable for surgery. (all labs ordered are listed, but only abnormal results are displayed)  Labs Reviewed  HEMOGLOBIN A1C  GLUCOSE, CAPILLARY     IMAGES:   EKG:   CV: Lexiscan Tetrofosmin stress test 08/13/2022: Lexiscan nuclear stress test performed using 1-day protocol. SPECT images showed small sized, mild intensity, reversible perfusion defect in basal inferior  myocardium.  Normal myocardial wall motion and thickening. Stress LVEF 60%. Low risk study. Past Medical History:  Diagnosis Date   Anxiety    Arthritis    Patient verbalizes she has athritis to right ankle   Dyspnea    Frozen shoulder syndrome 07/30/2019   Hyperlipidemia    Hypertension    Obesity    PAD (peripheral artery disease)    Pre-diabetes     Past Surgical History:  Procedure Laterality Date   ABDOMINAL AORTOGRAM W/LOWER EXTREMITY Bilateral 08/16/2022   Procedure: ABDOMINAL AORTOGRAM W/LOWER EXTREMITY;  Surgeon: Yates Decamp, MD;  [facility] INVAS[facility] CV LAB;  Service: Cardiovascular;  Laterality: Bilateral;   ABDOMINAL AORTOGRAM W/LOWER EXTREMITY N/A 09/06/2022   Procedure: ABDOMINAL AORTOGRAM W/LOWER EXTREMITY;  Surgeon: Yates Decamp, MD;  Location: [facility] INVASIVE CV LAB;  Service: Cardiovascular;  Laterality: N/A;   ECTOPIC PREGNANCY SURGERY  X 2   PERIPHERAL VASCULAR ATHERECTOMY Right 08/16/2022   Procedure: PERIPHERAL VASCULAR ATHERECTOMY;  Surgeon: Yates Decamp, MD;  Location: [facility] INVASIVE CV LAB;  Service: Cardiovascular;  Laterality: Right;  R SFA   PERIPHERAL VASCULAR ATHERECTOMY  09/06/2022   Procedure: PERIPHERAL VASCULAR ATHERECTOMY;  Surgeon: Yates Decamp, MD;  Location: [facility] INVASIVE CV LAB;  Service: Cardiovascular;;   PERIPHERAL VASCULAR BALLOON ANGIOPLASTY  08/16/2022   Procedure: PERIPHERAL VASCULAR BALLOON ANGIOPLASTY;  Surgeon: Yates Decamp, MD;  [facility];  Service: Cardiovascular;;   PERIPHERAL VASCULAR INTERVENTION  09/06/2022   Procedure: PERIPHERAL VASCULAR INTERVENTION;  Surgeon: Yates Decamp, MD;  [facility]VAS[facility] CV LAB;  Service: Cardiovascular;;    MEDICATIONS:  amLODipine (NORVASC) 10 MG tablet   aspirin (ASPIRIN CHILDRENS) 81 MG chewable tablet   atorvastatin (LIPITOR) 20 MG tablet   buPROPion (WELLBUTRIN SR) 150 MG 12 hr tablet   busPIRone (BUSPAR) 5 MG tablet   clopidogrel (PLAVIX) 75 MG tablet   diclofenac Sodium (VOLTAREN) 1 % GEL    gabapentin (NEURONTIN) 100 MG capsule   HYDROcodone-acetaminophen (NORCO/VICODIN) 5-325 MG tablet   ibuprofen (ADVIL) 800 MG tablet   losartan-hydrochlorothiazide (HYZAAR) 100-25 MG tablet   nicotine (NICODERM CQ) 21 mg/24hr patch   ondansetron (ZOFRAN-ODT) 8 MG disintegrating tablet   Semaglutide (RYBELSUS) 3 MG TABS   Vitamin D, Ergocalciferol, (DRISDOL) 1.25 MG (50000 UNIT) CAPS capsule   No current facility-administered medications for this encounter.   Jodell Cipro Ward, PA-C [facility] Pre-Surgical Testing [phone]

## 2022-09-30 ENCOUNTER — Encounter (HOSPITAL_COMMUNITY)
Admission: RE | Disposition: A | Discharge status: Home / Self Care | Payer: Self-pay | Source: Home / Self Care | Attending: Surgery

## 2022-09-30 ENCOUNTER — Other Ambulatory Visit: Payer: No Typology Code available for payment source

## 2022-09-30 ENCOUNTER — Ambulatory Visit (HOSPITAL_BASED_OUTPATIENT_CLINIC_OR_DEPARTMENT_OTHER): Payer: No Typology Code available for payment source | Admitting: Certified Registered Nurse Anesthetist

## 2022-09-30 ENCOUNTER — Encounter (HOSPITAL_COMMUNITY): Payer: Self-pay | Admitting: Surgery

## 2022-09-30 ENCOUNTER — Ambulatory Visit (HOSPITAL_COMMUNITY): Payer: No Typology Code available for payment source | Admitting: Physician Assistant

## 2022-09-30 ENCOUNTER — Ambulatory Visit (HOSPITAL_COMMUNITY)
Admission: RE | Admit: 2022-09-30 | Discharge: 2022-09-30 | Disposition: A | Discharge status: Home / Self Care | Payer: No Typology Code available for payment source | Attending: Surgery | Admitting: Surgery

## 2022-09-30 ENCOUNTER — Other Ambulatory Visit: Payer: Self-pay

## 2022-09-30 DIAGNOSIS — I10 Essential (primary) hypertension: Secondary | ICD-10-CM | POA: Diagnosis not present

## 2022-09-30 DIAGNOSIS — I739 Peripheral vascular disease, unspecified: Secondary | ICD-10-CM | POA: Diagnosis not present

## 2022-09-30 DIAGNOSIS — K811 Chronic cholecystitis: Secondary | ICD-10-CM | POA: Diagnosis not present

## 2022-09-30 DIAGNOSIS — R7303 Prediabetes: Secondary | ICD-10-CM

## 2022-09-30 DIAGNOSIS — F17210 Nicotine dependence, cigarettes, uncomplicated: Secondary | ICD-10-CM | POA: Diagnosis not present

## 2022-09-30 DIAGNOSIS — K8010 Calculus of gallbladder with chronic cholecystitis without obstruction: Secondary | ICD-10-CM | POA: Diagnosis not present

## 2022-09-30 DIAGNOSIS — F419 Anxiety disorder, unspecified: Secondary | ICD-10-CM | POA: Diagnosis not present

## 2022-09-30 DIAGNOSIS — Z79899 Other long term (current) drug therapy: Secondary | ICD-10-CM | POA: Diagnosis not present

## 2022-09-30 HISTORY — PX: CHOLECYSTECTOMY: SHX55

## 2022-09-30 LAB — GLUCOSE, CAPILLARY: Glucose-Capillary: 88 mg/dL (ref 70–99)

## 2022-09-30 SURGERY — LAPAROSCOPIC CHOLECYSTECTOMY
Anesthesia: General

## 2022-09-30 MED ORDER — FENTANYL CITRATE PF 50 MCG/ML IJ SOSY
PREFILLED_SYRINGE | INTRAMUSCULAR | Status: AC
  Administered 2022-09-30: 50 ug via INTRAVENOUS
  Filled 2022-09-30: qty 2

## 2022-09-30 MED ORDER — BUPIVACAINE LIPOSOME 1.3 % IJ SUSP: 20.0000 mL | Freq: Once | INTRAMUSCULAR | Status: DC

## 2022-09-30 MED ORDER — LACTATED RINGERS IR SOLN
Status: DC | PRN
  Administered 2022-09-30: 1000 mL

## 2022-09-30 MED ORDER — GABAPENTIN 300 MG PO CAPS
300.0000 mg | ORAL_CAPSULE | ORAL | Status: DC
  Filled 2022-09-30: qty 1

## 2022-09-30 MED ORDER — OXYCODONE HCL 5 MG PO TABS
ORAL_TABLET | ORAL | Status: AC
  Filled 2022-09-30: qty 1

## 2022-09-30 MED ORDER — CHLORHEXIDINE GLUCONATE 0.12 % MT SOLN
15.0000 mL | Freq: Once | OROMUCOSAL | Status: AC
  Administered 2022-09-30: 15 mL via OROMUCOSAL

## 2022-09-30 MED ORDER — OXYCODONE-ACETAMINOPHEN 5-325 MG PO TABS: 1.0000 | ORAL_TABLET | ORAL | 0 refills | Status: DC | PRN

## 2022-09-30 MED ORDER — FENTANYL CITRATE (PF) 100 MCG/2ML IJ SOLN
INTRAMUSCULAR | Status: AC
  Filled 2022-09-30: qty 2

## 2022-09-30 MED ORDER — ORAL CARE MOUTH RINSE: 15.0000 mL | Freq: Once | OROMUCOSAL | Status: AC

## 2022-09-30 MED ORDER — SUCCINYLCHOLINE CHLORIDE 200 MG/10ML IV SOSY
PREFILLED_SYRINGE | INTRAVENOUS | Status: DC | PRN
  Administered 2022-09-30: 160 mg via INTRAVENOUS

## 2022-09-30 MED ORDER — MIDAZOLAM HCL 2 MG/2ML IJ SOLN
INTRAMUSCULAR | Status: AC
  Filled 2022-09-30: qty 2

## 2022-09-30 MED ORDER — ACETAMINOPHEN 500 MG PO TABS: 1000.0000 mg | ORAL_TABLET | Freq: Once | ORAL | Status: DC | PRN

## 2022-09-30 MED ORDER — ACETAMINOPHEN 500 MG PO TABS
1000.0000 mg | ORAL_TABLET | ORAL | Status: AC
  Administered 2022-09-30: 1000 mg via ORAL
  Filled 2022-09-30: qty 2

## 2022-09-30 MED ORDER — 0.9 % SODIUM CHLORIDE (POUR BTL) OPTIME
TOPICAL | Status: DC | PRN
  Administered 2022-09-30: 1000 mL

## 2022-09-30 MED ORDER — SUGAMMADEX SODIUM 200 MG/2ML IV SOLN
INTRAVENOUS | Status: DC | PRN
  Administered 2022-09-30: 300 mg via INTRAVENOUS

## 2022-09-30 MED ORDER — FENTANYL CITRATE PF 50 MCG/ML IJ SOSY
PREFILLED_SYRINGE | INTRAMUSCULAR | Status: AC
  Filled 2022-09-30: qty 1

## 2022-09-30 MED ORDER — BUPIVACAINE HCL (PF) 0.25 % IJ SOLN
INTRAMUSCULAR | Status: AC
  Filled 2022-09-30: qty 30

## 2022-09-30 MED ORDER — LIDOCAINE 2% (20 MG/ML) 5 ML SYRINGE
INTRAMUSCULAR | Status: DC | PRN
  Administered 2022-09-30: 60 mg via INTRAVENOUS

## 2022-09-30 MED ORDER — ROCURONIUM BROMIDE 10 MG/ML (PF) SYRINGE
PREFILLED_SYRINGE | INTRAVENOUS | Status: DC | PRN
  Administered 2022-09-30: 30 mg via INTRAVENOUS

## 2022-09-30 MED ORDER — ACETAMINOPHEN 160 MG/5ML PO SOLN: 1000.0000 mg | Freq: Once | ORAL | Status: DC | PRN

## 2022-09-30 MED ORDER — IPRATROPIUM-ALBUTEROL 0.5-2.5 (3) MG/3ML IN SOLN
RESPIRATORY_TRACT | Status: AC
  Filled 2022-09-30: qty 3

## 2022-09-30 MED ORDER — PROPOFOL 10 MG/ML IV BOLUS
INTRAVENOUS | Status: DC | PRN
  Administered 2022-09-30: 180 mg via INTRAVENOUS

## 2022-09-30 MED ORDER — OXYCODONE HCL 5 MG PO TABS
5.0000 mg | ORAL_TABLET | Freq: Once | ORAL | Status: AC | PRN
  Administered 2022-09-30: 5 mg via ORAL

## 2022-09-30 MED ORDER — ROCURONIUM BROMIDE 10 MG/ML (PF) SYRINGE
PREFILLED_SYRINGE | INTRAVENOUS | Status: AC
  Filled 2022-09-30: qty 10

## 2022-09-30 MED ORDER — ONDANSETRON HCL 4 MG/2ML IJ SOLN
INTRAMUSCULAR | Status: AC
  Filled 2022-09-30: qty 2

## 2022-09-30 MED ORDER — LACTATED RINGERS IV SOLN: INTRAVENOUS | Status: DC

## 2022-09-30 MED ORDER — FENTANYL CITRATE PF 50 MCG/ML IJ SOSY
25.0000 ug | PREFILLED_SYRINGE | INTRAMUSCULAR | Status: DC | PRN
  Administered 2022-09-30 (×2): 50 ug via INTRAVENOUS

## 2022-09-30 MED ORDER — ACETAMINOPHEN 10 MG/ML IV SOLN: 1000.0000 mg | Freq: Once | INTRAVENOUS | Status: DC | PRN

## 2022-09-30 MED ORDER — DEXAMETHASONE SODIUM PHOSPHATE 10 MG/ML IJ SOLN
INTRAMUSCULAR | Status: DC | PRN
  Administered 2022-09-30: 10 mg via INTRAVENOUS

## 2022-09-30 MED ORDER — CEFAZOLIN IN SODIUM CHLORIDE 3-0.9 GM/100ML-% IV SOLN
3.0000 g | INTRAVENOUS | Status: AC
  Administered 2022-09-30: 3 g via INTRAVENOUS
  Filled 2022-09-30: qty 100

## 2022-09-30 MED ORDER — OXYCODONE HCL 5 MG/5ML PO SOLN: 5.0000 mg | Freq: Once | ORAL | Status: AC | PRN

## 2022-09-30 MED ORDER — CHLORHEXIDINE GLUCONATE CLOTH 2 % EX PADS: 6.0000 | MEDICATED_PAD | Freq: Once | CUTANEOUS | Status: DC

## 2022-09-30 MED ORDER — DEXAMETHASONE SODIUM PHOSPHATE 10 MG/ML IJ SOLN
INTRAMUSCULAR | Status: AC
  Filled 2022-09-30: qty 1

## 2022-09-30 MED ORDER — BUPIVACAINE HCL (PF) 0.25 % IJ SOLN
INTRAMUSCULAR | Status: DC | PRN
  Administered 2022-09-30: 30 mL

## 2022-09-30 MED ORDER — FENTANYL CITRATE (PF) 100 MCG/2ML IJ SOLN
INTRAMUSCULAR | Status: DC | PRN
  Administered 2022-09-30: 100 ug via INTRAVENOUS
  Administered 2022-09-30 (×2): 50 ug via INTRAVENOUS

## 2022-09-30 MED ORDER — ONDANSETRON HCL 4 MG/2ML IJ SOLN
INTRAMUSCULAR | Status: DC | PRN
  Administered 2022-09-30: 4 mg via INTRAVENOUS

## 2022-09-30 MED ORDER — MIDAZOLAM HCL 5 MG/5ML IJ SOLN
INTRAMUSCULAR | Status: DC | PRN
  Administered 2022-09-30: 2 mg via INTRAVENOUS

## 2022-09-30 MED ORDER — IPRATROPIUM-ALBUTEROL 0.5-2.5 (3) MG/3ML IN SOLN
3.0000 mL | Freq: Once | RESPIRATORY_TRACT | Status: AC
  Administered 2022-09-30: 3 mL via RESPIRATORY_TRACT

## 2022-09-30 SURGICAL SUPPLY — 45 items
ADH SKN CLS APL DERMABOND .7 (GAUZE/BANDAGES/DRESSINGS) ×1
APL PRP STRL LF DISP 70% ISPRP (MISCELLANEOUS) ×1
APPLIER CLIP ROT 10 11.4 M/L (STAPLE) ×1
APR CLP MED LRG 11.4X10 (STAPLE) ×1
BAG COUNTER SPONGE SURGICOUNT (BAG) IMPLANT
BAG SPEC RTRVL 10 TROC 200 (ENDOMECHANICALS) ×1
BAG SPNG CNTER NS LX DISP (BAG)
CABLE HIGH FREQUENCY MONO STRZ (ELECTRODE) ×1 IMPLANT
CATH URETL OPEN 5X70 (CATHETERS) IMPLANT
CHLORAPREP W/TINT 26 (MISCELLANEOUS) ×1 IMPLANT
CLIP APPLIE ROT 10 11.4 M/L (STAPLE) ×1 IMPLANT
COVER MAYO STAND XLG (MISCELLANEOUS) ×1 IMPLANT
COVER SURGICAL LIGHT HANDLE (MISCELLANEOUS) ×1 IMPLANT
DERMABOND ADVANCED .7 DNX12 (GAUZE/BANDAGES/DRESSINGS) ×1 IMPLANT
DRAPE C-ARM 42X120 X-RAY (DRAPES) IMPLANT
ELECT REM PT RETURN 15FT ADLT (MISCELLANEOUS) ×1 IMPLANT
ENDOLOOP SUT PDS II  0 18 (SUTURE) ×1
ENDOLOOP SUT PDS II 0 18 (SUTURE) ×1 IMPLANT
GLOVE BIO SURGEON STRL SZ7.5 (GLOVE) ×1 IMPLANT
GLOVE BIOGEL PI IND STRL 8 (GLOVE) ×1 IMPLANT
GOWN STRL REUS W/ TWL XL LVL3 (GOWN DISPOSABLE) ×2 IMPLANT
GOWN STRL REUS W/TWL XL LVL3 (GOWN DISPOSABLE) ×2
GRASPER SUT TROCAR 14GX15 (MISCELLANEOUS) IMPLANT
HEMOSTAT SNOW SURGICEL 2X4 (HEMOSTASIS) IMPLANT
IRRIG SUCT STRYKERFLOW 2 WTIP (MISCELLANEOUS) ×1
IRRIGATION SUCT STRKRFLW 2 WTP (MISCELLANEOUS) ×1 IMPLANT
IV CATH 14GX2 1/4 (CATHETERS) ×1 IMPLANT
KIT BASIN OR (CUSTOM PROCEDURE TRAY) ×1 IMPLANT
KIT TURNOVER KIT A (KITS) IMPLANT
NDL INSUFFLATION 14GA 120MM (NEEDLE) ×1 IMPLANT
NEEDLE INSUFFLATION 14GA 120MM (NEEDLE) ×1 IMPLANT
PENCIL SMOKE EVACUATOR (MISCELLANEOUS) IMPLANT
POUCH RETRIEVAL ECOSAC 10 (ENDOMECHANICALS) ×1 IMPLANT
POUCH RETRIEVAL ECOSAC 10MM (ENDOMECHANICALS) ×1
SCISSORS LAP 5X35 DISP (ENDOMECHANICALS) ×1 IMPLANT
SET TUBE SMOKE EVAC HIGH FLOW (TUBING) ×1 IMPLANT
SLEEVE Z-THREAD 5X100MM (TROCAR) ×2 IMPLANT
SPIKE FLUID TRANSFER (MISCELLANEOUS) ×1 IMPLANT
STOPCOCK 4 WAY LG BORE MALE ST (IV SETS) IMPLANT
SUT MNCRL AB 4-0 PS2 18 (SUTURE) ×1 IMPLANT
TOWEL OR 17X26 10 PK STRL BLUE (TOWEL DISPOSABLE) ×1 IMPLANT
TOWEL OR NON WOVEN STRL DISP B (DISPOSABLE) IMPLANT
TRAY LAPAROSCOPIC (CUSTOM PROCEDURE TRAY) ×1 IMPLANT
TROCAR ADV FIXATION 12X100MM (TROCAR) ×1 IMPLANT
TROCAR Z-THREAD OPTICAL 5X100M (TROCAR) ×1 IMPLANT

## 2022-09-30 NOTE — Anesthesia Procedure Notes (Signed)
Procedure Name: Intubation Date/Time: 09/30/2022 5:35 PM  Performed by: Lovie Chol, CRNAPre-anesthesia Checklist: Patient identified, Emergency Drugs available, Suction available and Patient being monitored Patient Re-evaluated:Patient Re-evaluated prior to induction Oxygen Delivery Method: Circle System Utilized Preoxygenation: Pre-oxygenation with 100% oxygen Induction Type: IV induction, Rapid sequence and Cricoid Pressure applied Laryngoscope Size: Miller and 3 Grade View: Grade I Tube type: Oral Tube size: 7.5 mm Number of attempts: 1 Airway Equipment and Method: Stylet Placement Confirmation: ETT inserted through vocal cords under direct vision, positive ETCO2 and breath sounds checked- equal and bilateral Secured at: 21 cm Tube secured with: Tape Dental Injury: Teeth and Oropharynx as per pre-operative assessment

## 2022-09-30 NOTE — Transfer of Care (Signed)
Immediate Anesthesia Transfer of Care Note  Patient: Sharon Dyer  Procedure(s) Performed: LAPAROSCOPIC CHOLECYSTECTOMY  Patient Location: PACU  Anesthesia Type:General  Level of Consciousness: awake, oriented, and patient cooperative  Airway & Oxygen Therapy: Patient Spontanous Breathing and Patient connected to face mask oxygen  Post-op Assessment: Report given to RN and Post -op Vital signs reviewed and stable  Post vital signs: Reviewed  Last Vitals:  Vitals Value Taken Time  BP 189/152 09/30/22 1831  Temp 36.7 C 09/30/22 1831  Pulse 83 09/30/22 1835  Resp 18 09/30/22 1835  SpO2 98 % 09/30/22 1835  Vitals shown include unvalidated device data.  Last Pain:  Vitals:   09/30/22 1550  TempSrc: Oral  PainSc: 0-No pain         Complications: No notable events documented.

## 2022-09-30 NOTE — H&P (Signed)
Admitting Physician: Hyman Hopes Francisca Harbuck  Service: General Surgery  CC: Billiary colic  Subjective   HPI: Sharon Dyer is an 57 y.o. female who is here for laparoscopic cholecystectomy.  Past Medical History:  Diagnosis Date   Anxiety    Arthritis    Patient verbalizes she has athritis to right ankle   Dyspnea    Frozen shoulder syndrome 07/30/2019   Hyperlipidemia    Hypertension    Obesity    PAD (peripheral artery disease)    Pre-diabetes     Past Surgical History:  Procedure Laterality Date   ABDOMINAL AORTOGRAM W/LOWER EXTREMITY Bilateral 08/16/2022   Procedure: ABDOMINAL AORTOGRAM W/LOWER EXTREMITY;  Surgeon: Yates Decamp, MD;  [facility] LAB;  Service: Cardiovascular;  Laterality: Bilateral;   ABDOMINAL AORTOGRAM W/LOWER EXTREMITY N/A 09/06/2022   Procedure: ABDOMINAL AORTOGRAM W/LOWER EXTREMITY;  Surgeon: Yates Decamp, MD;  [facility] LAB;  Service: Cardiovascular;  Laterality: N/A;   ECTOPIC PREGNANCY SURGERY  X 2   PERIPHERAL VASCULAR ATHERECTOMY Right 08/16/2022   Procedure: PERIPHERAL VASCULAR ATHERECTOMY;  Surgeon: Yates Decamp, MD;  Location: [facility] INVASIVE CV LAB;  Service: Cardiovascular;  Laterality: Right;  R SFA   PERIPHERAL VASCULAR ATHERECTOMY  09/06/2022   Procedure: PERIPHERAL VASCULAR ATHERECTOMY;  Surgeon: Yates Decamp, MD;  [facility] INVASIVE CV LAB;  Service: Cardiovascular;;   PERIPHERAL VASCULAR BALLOON ANGIOPLASTY  08/16/2022   Procedure: PERIPHERAL VASCULAR BALLOON ANGIOPLASTY;  Surgeon: Yates Decamp, MD;  [facility];  Service: Cardiovascular;;   PERIPHERAL VASCULAR INTERVENTION  09/06/2022   Procedure: PERIPHERAL VASCULAR INTERVENTION;  Surgeon: Yates Decamp, MD;  [facility];  Service: Cardiovascular;;    Family History  Problem Relation Age of Onset   Breast cancer Mother        in 11s   Hypertension Mother    Stroke Mother     Social:  reports that she has been smoking cigarettes. She  started smoking about 41 years ago. She has a 5.55 pack-year smoking history. She has never used smokeless tobacco. She reports that she does not currently use alcohol. She reports that she does not use drugs.  Allergies: Not on File  Medications: Current Outpatient Medications  Medication Instructions   amLODipine (NORVASC) 10 MG tablet TAKE ONE TABLET BY MOUTH DAILY   aspirin (ASPIRIN CHILDRENS) 81 mg, Oral, Daily   atorvastatin (LIPITOR) 20 mg, Oral, Daily   buPROPion (WELLBUTRIN SR) 150 mg, Oral, 2 times daily, Morning and late afternoon   busPIRone (BUSPAR) 5 mg, Oral, 2 times daily   clopidogrel (PLAVIX) 75 mg, Oral, Daily   diclofenac Sodium (VOLTAREN) 4 g, Topical, 4 times daily   gabapentin (NEURONTIN) 100 MG capsule TAKE 1 CAPSULE(100 MG) BY MOUTH THREE TIMES DAILY   HYDROcodone-acetaminophen (NORCO/VICODIN) 5-325 MG tablet 1 tablet, Oral, Every 6 hours PRN   ibuprofen (ADVIL) 800 mg, Oral, Every 8 hours PRN   losartan-hydrochlorothiazide (HYZAAR) 100-25 MG tablet 1 tablet, Oral, Daily   nicotine (NICODERM CQ) 21 mg, Transdermal, Daily   ondansetron (ZOFRAN-ODT) 8 mg, Oral, Every 8 hours PRN   Rybelsus 3 mg, Oral, Daily   Vitamin D (Ergocalciferol) (DRISDOL) 50,000 Units, Oral, Every 7 days    ROS - all of the below systems have been reviewed with the patient and positives are indicated with bold text General: chills, fever or night sweats Eyes: blurry vision or double vision ENT: epistaxis or sore throat Allergy/Immunology: itchy/watery eyes or nasal congestion Hematologic/Lymphatic: bleeding  problems, blood clots or swollen lymph nodes Endocrine: temperature intolerance or unexpected weight changes Breast: new or changing breast lumps or nipple discharge Resp: cough, shortness of breath, or wheezing CV: chest pain or dyspnea on exertion GI: as per HPI GU: dysuria, trouble voiding, or hematuria MSK: joint pain or joint stiffness Neuro: TIA or stroke symptoms Derm:  pruritus and skin lesion changes Psych: anxiety and depression  Objective   PE Blood pressure 130/83, pulse 73, temperature 97.8 F (36.6 C), temperature source Oral, resp. rate 16, height  (1.727 m), weight 133.4 kg, last menstrual period 01/12/2015, SpO2 95 %. Constitutional: NAD; conversant; no deformities Eyes: Moist conjunctiva; no lid lag; anicteric; PERRL Neck: Trachea midline; no thyromegaly Lungs: Normal respiratory effort; no tactile fremitus CV: RRR; no palpable thrills; no pitting edema GI: Abd Soft, nontender; no palpable hepatosplenomegaly MSK: Normal range of motion of extremities; no clubbing/cyanosis Psychiatric: Appropriate affect; alert and oriented x3 Lymphatic: No palpable cervical or axillary lymphadenopathy  Results for orders placed or performed during the hospital encounter of 09/30/22 (from the past 24 hour(s))  Glucose, capillary     Status: None   Collection Time: 09/30/22  1:28 PM  Result Value Ref Range   Glucose-Capillary 88 70 - 99 mg/dL   Comment 1 Notify RN    Comment 2 Document in Chart     Imaging Orders  No imaging studies ordered today     Assessment and Plan   Sharon Dyer is a 57 year old female with biliary colic and gallstones.  I recommended laparoscopic cholecystectomy.  We discussed the procedure, its risks, benefits and alternatives and the patient granted consent to proceed.  We will proceed as scheduled.  Quentin Ore, MD  [facility] Use [email or web address] to contact on call provider

## 2022-09-30 NOTE — Discharge Instructions (Signed)
 CHOLECYSTECTOMY POST OPERATIVE INSTRUCTIONS  Thinking Clearly  The anesthesia may cause you to feel different for 1 or 2 days. Do not drive, drink alcohol, or make any big decisions for at least 2 days.  Nutrition When you wake up, you will be able to drink small amounts of liquid. If you do not feel sick, you can slowly advance your diet to regular foods. Continue to drink lots of fluids, usually about 8 to 10 glasses per day. Eat a high-fiber diet so you don't strain during bowel movements. High-Fiber Foods Foods high in fiber include beans, bran cereals and whole-grain breads, peas, dried fruit (figs, apricots, and dates), raspberries, blackberries, strawberries, sweet corn, broccoli, baked potatoes with skin, plums, pears, apples, greens, and nuts. Activity Slowly increase your activity. Be sure to get up and walk every hour or so to prevent blood clots. No heavy lifting or strenuous activity for 4 weeks following surgery to prevent hernias at your incision sites It is normal to feel tired. You may need more sleep than usual.  Get your rest but make sure to get up and move around frequently to prevent blood clots and pneumonia.  Work and Return to School You can go back to work when you feel well enough. Discuss the timing with your surgeon. You can usually go back to school or work 1 week after an operation. If your work requires heavy lifting or strenuous activity you need to be placed on light duty for 4 weeks following surgery. You can return to gym class, sports or other physical activities 4 weeks after surgery.  Wound Care Always wash your hands before and after touching near your incision site. Do not soak in a bathtub until cleared at your follow up appointment. You may take a shower 24 hours after surgery. A small amount of drainage from the incision is normal. If the drainage is thick and yellow or the site is red, you may have an infection, so call your surgeon. If you  have a drain in one of your incisions, it will be taken out in office when the drainage stops. Steri-Strips will fall off in 7 to 10 days or they will be removed during your first office visit. If you have dermabond glue covering over the incision, allow the glue to flake off on its own. Avoid wearing tight or rough clothing. It may rub your incisions and make it harder for them to heal. Protect the new skin, especially from the sun. The sun can burn and cause darker scarring. Your scar will heal in about 4 to 6 weeks and will become softer and continue to fade over the next year.  The cosmetic appearance of the incisions will improve over the course of the first year after surgery. Sensation around your incision will return in a few weeks or months.  Bowel Movements After intestinal surgery, you may have loose watery stools for several days. If watery diarrhea lasts longer than 3 days, contact your surgeon. Pain medication (narcotics) can cause constipation. Increase the fiber in your diet with high-fiber foods if you are constipated. You can take an over the counter stool softener like Colace to avoid constipation.  Additional over the counter medications can also be used if Colace isn't sufficient (for example, Milk of Magnesia or Miralax).  Pain The amount of pain is different for each person. Some people need only 1 to 3 doses of pain control medication, while others need more. Take alternating doses of tylenol   and ibuprofen around the clock for the first five days following surgery.  This will provide a baseline of pain control and help with inflammation.  Take the narcotic pain medication in addition if needed for severe pain.  Contact Your Surgeon at [phone], if you have: Pain in your right upper abdomen like a gallbladder attack. Pain that will not go away Pain that gets worse A fever of more than 101F (38.3C) Repeated vomiting Swelling, redness, bleeding, or bad-smelling  drainage from your wound site Strong abdominal pain No bowel movement or unable to pass gas for 3 days Watery diarrhea lasting longer than 3 days  Pain Control The goal of pain control is to minimize pain, keep you moving and help you heal. Your surgical team will work with you on your pain plan. Most often a combination of therapies and medications are used to control your pain. You may also be given medication (local anesthetic) at the surgical site. This may help control your pain for several days. Extreme pain puts extra stress on your body at a time when your body needs to focus on healing. Do not wait until your pain has reached a level "10" or is unbearable before telling your doctor or nurse. It is much easier to control pain before it becomes severe. Following a laparoscopic procedure, pain is sometimes felt in the shoulder. This is due to the gas inserted into your abdomen during the procedure. Moving and walking helps to decrease the gas and the right shoulder pain.  Use the guide below for ways to manage your post-operative pain. Learn more by going to facs.org/safepaincontrol.  How Intense Is My Pain Common Therapies to Feel Better       I hardly notice my pain, and it does not interfere with my activities.  I notice my pain and it distracts me, but I can still do activities (sitting up, walking, standing).  Non-Medication Therapies  Ice (in a bag, applied over clothing at the surgical site), elevation, rest, meditation, massage, distraction (music, TV, play) walking and mild exercise Splinting the abdomen with pillows +  Non-Opioid Medications Acetaminophen (Tylenol) Non-steroidal anti-inflammatory drugs (NSAIDS) Aspirin, Ibuprofen (Motrin, Advil) Naproxen (Aleve) Take these as needed, when you feel pain. Both acetaminophen and NSAIDs help to decrease pain and swelling (inflammation).      My pain is hard to ignore and is more noticeable even when I rest.  My  pain interferes with my usual activities.  Non-Medication Therapies  +  Non-Opioid medications  Take on a regular schedule (around-the-clock) instead of as needed. (For example, Tylenol every 6 hours at 9:00 am, 3:00 pm, 9:00 pm, 3:00 am and Motrin every 6 hours at 12:00 am, 6:00 am, 12:00 pm, 6:00 pm)         I am focused on my pain, and I am not doing my daily activities.  I am groaning in pain, and I cannot sleep. I am unable to do anything.  My pain is as bad as it could be, and nothing else matters.  Non-Medication Therapies  +  Around-the-Clock Non-Opioid Medications  +  Short-acting opioids  Opioids should be used with other medications to manage severe pain. Opioids block pain and give a feeling of euphoria (feel high). Addiction, a serious side effect of opioids, is rare with short-term (a few days) use.  Examples of short-acting opioids include: Tramadol (Ultram), Hydrocodone (Norco, Vicodin), Hydromorphone (Dilaudid), Oxycodone (Oxycontin)     The above directions have been adapted from   the American College of Surgeons Surgical Patient Education Program.  Please refer to the ACS website if needed: [email or web address].   Gaius Ishaq, MD [facility] [address], [address], [place], [place]  [address] ?  [address] [address], [place], [place]   [address] [phone] ? [phone] ? FAX [phone] Web site: [email or web address]  

## 2022-09-30 NOTE — Op Note (Signed)
Patient: Sharon Dyer (11-27-1965, [id number])  Date of Surgery: 09/30/2022   Preoperative Diagnosis: CHOLECYSTITIS   Postoperative Diagnosis: CHOLECYSTITIS   Surgical Procedure: LAPAROSCOPIC CHOLECYSTECTOMY:    Operative Team Members:  Surgeon(s) and Role:    * Woodroe Vogan, Hyman Hopes, MD - Primary   Anesthesiologist: Val Eagle, MD CRNA: Lovie Chol, CRNA   Anesthesia: General   Fluids:  Total I/O In: 1100 [I.V.:1000; IV Piggyback:100] Out: -   Complications: * No complications entered in OR log *  Drains:  none   Specimen:  ID Type Source Tests Collected by Time Destination  1 : gallbladder Tissue PATH Gallbladder SURGICAL PATHOLOGY Hashem Goynes, Hyman Hopes, MD 09/30/2022 1746      Disposition:  PACU - hemodynamically stable.  Plan of Care: Discharge to home after PACU    Indications for Procedure: Sharon Dyer is a 57 y.o. female who presented with biliary colic and gallstones.  History, physical and imaging was concerning for cholecystitis.  Laparoscopic cholecystectomy was recommended for the patient.  The procedure itself, as well as the risks, benefits and alternatives were discussed with the patient.  Risks discussed included but were not limited to the risk of infection, bleeding, damage to nearby structures, need to convert to open procedure, incisional hernia, bile leak, common bile duct injury and the need for additional procedures or surgeries.  With this discussion complete and all questions answered the patient granted consent to proceed.  Findings: Gallstones  Infection status: Patient: Private Patient Elective Case Case: Elective Infection Present At Time Of Surgery (PATOS):  Inflamed gallbladder   Description of Procedure:   On the date stated above, the patient was taken to the operating room suite and placed in supine positioning.  Sequential compression devices were placed on the lower extremities to prevent blood clots.  General  endotracheal anesthesia was induced. Preoperative antibiotics were given.  The patient's abdomen was prepped and draped in the usual sterile fashion.  A time-out was completed verifying the correct patient, procedure, positioning and equipment needed for the case.  We began by anesthetizing the skin with local anesthetic and then making a 5 mm incision just below the umbilicus.  We dissected through the subcutaneous tissues to the fascia.  The fascia was grasped and elevated using a Kocher clamp.  A Veress needle was inserted into the abdomen and the abdomen was insufflated to 15 mmHg.  A 5 mm trocar was inserted in this position under optical guidance and then the abdomen was inspected.  There was no trauma to the underlying viscera with initial trocar placement.  Any abnormal findings, other than inflammation in the right upper quadrant, are listed above in the findings section.  Three additional trocars were placed, one 12 mm trocar in the subxiphoid position, one 5 mm trocar in the midline epigastric area and one 5mm trocar in the right upper quadrant subcostally.  These were placed under direct vision without any trauma to the underlying viscera.    The patient was then placed in head up, left side down positioning.  The gallbladder was identified and dissected free from its attachments to the omentum allowing the duodenum to fall away.  The infundibulum of the gallbladder was dissected free working laterally to medially.  The cystic duct and cystic artery were dissected free from surrounding connective tissue.  The infundibulum of the gallbladder was dissected off the cystic plate.  A critical view of safety was obtained with the cystic duct and cystic artery being cleared  of connective tissues and clearly the only two structures entering into the gallbladder with the liver clearly visible behind.  Clips were then applied to the cystic duct and cystic artery and then these structures were divided.  A PDS  endoloop was applied to the cystic duct stump. The gallbladder was dissected off the cystic plate, placed in an endocatch bag and removed from the 12 mm subxiphoid port site.  The clips were inspected and appeared effective.  The cystic plate was inspected and hemostasis was obtained using electrocautery.  A suction irrigator was used to clean the operative field.  Attention was turned to closure.  The 12 mm subxiphoid port site was closed using a 0-vicryl suture on a fascial suture passer.  The abdomen was desufflated.  The skin was closed using 4-0 monocryl and dermabond.  All sponge and needle counts were correct at the conclusion of the case.    Ivar Drape, MD General, Bariatric, & Minimally Invasive Surgery [facility], [place]

## 2022-09-30 NOTE — Patient Outreach (Signed)
Medicaid Managed Care Social Work Note  09/30/2022 Name:  Sharon Dyer MRN:  [id number] DOB:  [birth year]  Sharon Dyer is an 57 y.o. year old female who is a primary patient of Grayce Sessions, NP.  The Medicaid Managed Care Coordination team was consulted for assistance with:  Community Resources   Ms. Zumstein was given information about Medicaid Managed Care Coordination team services today. Sharon Dyer Patient agreed to services and verbal consent obtained.  Engaged with patient  for by telephone forfollow up visit in response to referral for case management and/or care coordination services.   Assessments/Interventions:  Review of past medical history, allergies, medications, health status, including review of consultants reports, laboratory and other test data, was performed as part of comprehensive evaluation and provision of chronic care management services.  SDOH: (Social Determinant of Health) assessments and interventions performed: SDOH Interventions    Flowsheet Row Office Visit from 02/05/2022 in [facility]  SDOH Interventions   Depression Interventions/Treatment  Medication, Counseling     BSW completed a telephone outreach with patient, she stated she did receive the affordable housing options BSW sent to her. Patient stated she was getting ready for her surgery today and no resources are needed at this time.  Advanced Directives Status:  Not addressed in this encounter.  Care Plan                 No Known Allergies  Medications Reviewed Today     Reviewed by Sharee Pimple, RN (Registered Nurse) on 09/25/22 at 0919  Med List Status: <None>   Medication Order Taking? Sig Documenting Provider Last Dose Status Informant  amLODipine (NORVASC) 10 MG tablet [id number]  TAKE ONE TABLET BY MOUTH DAILY Grayce Sessions, NP  Active Self  aspirin (ASPIRIN CHILDRENS) 81 MG chewable tablet [id number]  Chew 1 tablet (81 mg total) by  mouth daily. Yates Decamp, MD  Active Self  atorvastatin (LIPITOR) 20 MG tablet [id number]  Take 1 tablet (20 mg total) by mouth daily. Grayce Sessions, NP  Active Self  buPROPion [facility] SR) 150 MG 12 hr tablet [id number]  Take 1 tablet (150 mg total) by mouth 2 (two) times daily. Morning and late afternoon Yates Decamp, MD  Active Self  busPIRone (BUSPAR) 5 MG tablet [id number]  Take 1 tablet (5 mg total) by mouth 2 (two) times daily. Grayce Sessions, NP  Active Self           Med Note Richardean Canal Sep 19, 2022  2:28 PM) Currently needs a refill  clopidogrel (PLAVIX) 75 MG tablet [id number]  Take 1 tablet (75 mg total) by mouth daily. Yates Decamp, MD  Active   diclofenac Sodium (VOLTAREN) 1 % GEL [id number]  Apply 4 g topically 4 (four) times daily. Melene Plan, DO  Active Self  gabapentin (NEURONTIN) 100 MG capsule [id number]  TAKE 1 CAPSULE(100 MG) BY MOUTH THREE TIMES DAILY Grayce Sessions, NP  Active Self  HYDROcodone-acetaminophen (NORCO/VICODIN) 5-325 MG tablet [id number]  Take 1 tablet by mouth every 6 (six) hours as needed for severe pain. Zadie Rhine, MD  Active Self           Med Note Lenoria Farrier   Thu Sep 05, 2022  8:48 AM)    ibuprofen (ADVIL) 800 MG tablet [id number]  Take 800 mg by mouth every 8 (eight) hours as needed for moderate pain or mild pain. [provider]  Active  Self  losartan-hydrochlorothiazide (HYZAAR) 100-25 MG tablet [id number]  Take 1 tablet by mouth daily. Grayce Sessions, NP  Active Self  nicotine (NICODERM CQ) 21 mg/24hr patch [id number]  Place 1 patch (21 mg total) onto the skin daily. Yates Decamp, MD  Active Self  ondansetron (ZOFRAN-ODT) 8 MG disintegrating tablet [id number]  Take 1 tablet (8 mg total) by mouth every 8 (eight) hours as needed. Zadie Rhine, MD  Active Self           Med Note Christena Flake Sep 05, 2022  8:51 AM)    Semaglutide (RYBELSUS) 3 MG TABS [id number]  Take 1 tablet (3 mg total) by mouth  daily. Yates Decamp, MD  Active Self           Med Note Richardean Canal Sep 19, 2022  2:29 PM) Has not started yet, will wait until after procedure  Vitamin D, Ergocalciferol, (DRISDOL) 1.25 MG (50000 UNIT) CAPS capsule [id number]  Take 1 capsule (50,000 Units total) by mouth every 7 (seven) days.  Patient taking differently: Take 50,000 Units by mouth every Monday.   Grayce Sessions, NP  Active Self            Patient Active Problem List   Diagnosis Date Noted   Lower extremity edema 09/07/2022   Peripheral artery disease 09/06/2022   Claudication in peripheral vascular disease 08/16/2022   AKI (acute kidney injury) 01/29/2018   Hypokalemia 01/29/2018   Respiratory distress 01/29/2018   Tobacco abuse 01/29/2018   Hypoxemia 12/22/2017    Conditions to be addressed/monitored per PCP order:   community resources  There are no care plans that you recently modified to display for this patient.   Follow up:  Patient agrees to Care Plan and Follow-up.  Plan: The Managed Medicaid care management team will reach out to the patient again over the next 11 days.  Date/time of next scheduled Social Work care management/care coordination outreach:  10/15/22  Sharon Dyer, Sharon Dyer, Sharon Dyer [facility]  Managed [place] Social Worker [phone]

## 2022-09-30 NOTE — Anesthesia Postprocedure Evaluation (Signed)
Anesthesia Post Note  Patient: Sharon Dyer  Procedure(s) Performed: LAPAROSCOPIC CHOLECYSTECTOMY     Patient location during evaluation: PACU Anesthesia Type: General Level of consciousness: awake and alert Pain management: pain level controlled Vital Signs Assessment: post-procedure vital signs reviewed and stable Respiratory status: spontaneous breathing, nonlabored ventilation, respiratory function stable and patient connected to nasal cannula oxygen Cardiovascular status: blood pressure returned to baseline and stable Postop Assessment: no apparent nausea or vomiting Anesthetic complications: no   No notable events documented.  Last Vitals:  Vitals:   09/30/22 1836 09/30/22 1845  BP: (!) 150/66 (!) 160/86  Pulse: 83 82  Resp: 18 (!) 22  Temp:    SpO2: 98% 98%    Last Pain:  Vitals:   09/30/22 1842  TempSrc:   PainSc: 10-Worst pain ever                 Theophile Harvie

## 2022-09-30 NOTE — Patient Instructions (Signed)
Visit Information  Ms. Siddall was given information about Medicaid Managed Care team care coordination services as a part of their [employer] Medicaid benefit. [name] verbally consentedto engagement with the [facility] Managed Care team.   If you are experiencing a medical emergency, please call 911 or report to your local emergency department or urgent care.   If you have a non-emergency medical problem during routine business hours, please contact your provider's office and ask to speak with a nurse.   For questions related to your [employer] [facility] health plan, please call: [phone]  OR visit the member homepage at: [email or web address]  If you would like to schedule transportation through your [facility] plan, please call the following number at least 2 days in advance of your appointment: [phone]  If you are experiencing a behavioral health crisis, call the [employer] [facility] Crisis Line at [phone] [phone]). The line is available 24 hours a day, seven days a week.  If you would like help to quit smoking, call 1-800-QUIT-NOW (629-107-3706) OR Espaol: 1-855-Djelo-Ya (6-644-034-7425) o para ms informacin haga clic aqu or Text READY to 956-387 to register via text  Ms. Ghosh - following are the goals we discussed in your visit today:   Goals Addressed   None      Social Worker will follow up in 10/15/22.   Gus Puma, Kenard Gower, MHA [facility]  Managed Medicaid Social Worker [phone]   Following is a copy of your plan of care:  There are no care plans that you recently modified to display for this patient.

## 2022-10-01 ENCOUNTER — Encounter (HOSPITAL_COMMUNITY): Payer: Self-pay | Admitting: Surgery

## 2022-10-02 LAB — SURGICAL PATHOLOGY

## 2022-10-06 ENCOUNTER — Encounter: Payer: Self-pay | Admitting: Cardiology

## 2022-10-07 NOTE — Telephone Encounter (Signed)
From patient.

## 2022-10-10 NOTE — Telephone Encounter (Signed)
Contacted patient. She had a Lp(a) checked on 09/17/22 that was 76.4. I advised her of the results and that she did not qualify for the EZEF trial.

## 2022-10-12 ENCOUNTER — Encounter (INDEPENDENT_AMBULATORY_CARE_PROVIDER_SITE_OTHER): Payer: Self-pay | Admitting: Primary Care

## 2022-10-15 ENCOUNTER — Other Ambulatory Visit: Payer: No Typology Code available for payment source

## 2022-10-15 NOTE — Patient Outreach (Signed)
Medicaid Managed Care Social Work Note  10/15/2022 Name:  Sharon Dyer MRN:  [id number] DOB:  [birth year]  Sharon Dyer is an 57 y.o. year old female who is a primary patient of Grayce Sessions, NP.  The Medicaid Managed Care Coordination team was consulted for assistance with:  Community Resources   Ms. Saunder was given information about Medicaid Managed Care Coordination team services today. Sharon Dyer Patient agreed to services and verbal consent obtained.  Engaged with patient  for by telephone forfollow up visit in response to referral for case management and/or care coordination services.   Assessments/Interventions:  Review of past medical history, allergies, medications, health status, including review of consultants reports, laboratory and other test data, was performed as part of comprehensive evaluation and provision of chronic care management services.  SDOH: (Social Determinant of Health) assessments and interventions performed: SDOH Interventions    Flowsheet Row Office Visit from 02/05/2022 in [facility]  SDOH Interventions   Depression Interventions/Treatment  Medication, Counseling     BSW completed a telephone outreach with patient, she states her surgury went well and everything is going well. No resources are needed at this time.  Advanced Directives Status:  Not addressed in this encounter.  Care Plan                 Not on File  Medications Reviewed Today     Reviewed by Lovie Chol, CRNA (Certified Registered Nurse Anesthetist) on 09/30/22 at 1550  Med List Status: Complete   Medication Order Taking? Sig Documenting Provider Last Dose Status Informant  amLODipine (NORVASC) 10 MG tablet [id number] Yes TAKE ONE TABLET BY MOUTH DAILY Grayce Sessions, NP 09/30/2022 0745 Active Self  aspirin (ASPIRIN CHILDRENS) 81 MG chewable tablet [id number] Yes Chew 1 tablet (81 mg total) by mouth daily. Yates Decamp, MD 09/29/2022  Active Self  atorvastatin (LIPITOR) 20 MG tablet [id number] Yes Take 1 tablet (20 mg total) by mouth daily. Grayce Sessions, NP 09/29/2022 Active Self  buPROPion (WELLBUTRIN SR) 150 MG 12 hr tablet [id number] Yes Take 1 tablet (150 mg total) by mouth 2 (two) times daily. Morning and late afternoon Yates Decamp, MD 09/30/2022 0745 Active Self  busPIRone (BUSPAR) 5 MG tablet [id number] Yes Take 1 tablet (5 mg total) by mouth 2 (two) times daily. Grayce Sessions, NP 09/30/2022 0740 Active Self           Med Note Hart Rochester, Earline Mayotte Sep 19, 2022  2:28 PM) Currently needs a refill  clopidogrel (PLAVIX) 75 MG tablet [id number] No Take 1 tablet (75 mg total) by mouth daily. Yates Decamp, MD 09/23/2022 Active   diclofenac Sodium (VOLTAREN) 1 % GEL [id number] Yes Apply 4 g topically 4 (four) times daily. Melene Plan, DO Past Week Active Self  gabapentin (NEURONTIN) 100 MG capsule [id number] Yes TAKE 1 CAPSULE(100 MG) BY MOUTH THREE TIMES DAILY Grayce Sessions, NP 09/30/2022 0745 Active Self  HYDROcodone-acetaminophen (NORCO/VICODIN) 5-325 MG tablet [id number] Yes Take 1 tablet by mouth every 6 (six) hours as needed for severe pain. Zadie Rhine, MD Past Month Active Self           Med Note Christena Flake Sep 05, 2022  8:48 AM)    ibuprofen (ADVIL) 800 MG tablet [id number] Yes Take 800 mg by mouth every 8 (eight) hours as needed for moderate pain or mild pain. [provider] Past Week Active Self  losartan-hydrochlorothiazide Mauri Reading)  100-25 MG tablet [id number] Yes Take 1 tablet by mouth daily. Grayce Sessions, NP 09/29/2022 Active Self  nicotine (NICODERM CQ) 21 mg/24hr patch [id number] Yes Place 1 patch (21 mg total) onto the skin daily. Yates Decamp, MD  Active Self           Med Note Raymon Mutton Sep 30, 2022  1:36 PM) Ran out  ondansetron (ZOFRAN-ODT) 8 MG disintegrating tablet [id number] Yes Take 1 tablet (8 mg total) by mouth every 8 (eight) hours as needed. Zadie Rhine, MD Past Month Active Self           Med Note Christena Flake Sep 05, 2022  8:51 AM)    Semaglutide (RYBELSUS) 3 MG TABS [id number]  Take 1 tablet (3 mg total) by mouth daily. Yates Decamp, MD  Active Self           Med Note Richardean Canal Sep 19, 2022  2:29 PM) Has not started yet, will wait until after procedure  Vitamin D, Ergocalciferol, (DRISDOL) 1.25 MG (50000 UNIT) CAPS capsule [id number] Yes Take 1 capsule (50,000 Units total) by mouth every 7 (seven) days.  Patient taking differently: Take 50,000 Units by mouth every Monday.   Grayce Sessions, NP 09/23/2022 Active Self            Patient Active Problem List   Diagnosis Date Noted   Lower extremity edema 09/07/2022   Peripheral artery disease (HCC) 09/06/2022   Claudication in peripheral vascular disease (HCC) 08/16/2022   AKI (acute kidney injury) (HCC) 01/29/2018   Hypokalemia 01/29/2018   Respiratory distress 01/29/2018   Tobacco abuse 01/29/2018   Hypoxemia 12/22/2017    Conditions to be addressed/monitored per PCP order:   community resources  There are no care plans that you recently modified to display for this patient.   Follow up:  Patient agrees to Care Plan and Follow-up.  Plan: The  Patient has been provided with contact information for the Managed Medicaid care management team and has been advised to call with any health related questions or concerns.    Abelino Derrick, MHA [facility]  Managed [facility] Social Worker [phone]

## 2022-10-15 NOTE — Patient Instructions (Signed)
Visit Information  Ms. Jurs was given information about Medicaid Managed Care team care coordination services as a part of their [employer] Medicaid benefit. [name] verbally consentedto engagement with the [place] Managed Care team.   If you are experiencing a medical emergency, please call 911 or report to your local emergency department or urgent care.   If you have a non-emergency medical problem during routine business hours, please contact your provider's office and ask to speak with a nurse.   For questions related to your [employer] [facility] health plan, please call: [phone]  OR visit the member homepage at: [email or web address]  If you would like to schedule transportation through your [facility] plan, please call the following number at least 2 days in advance of your appointment: [phone]  If you are experiencing a behavioral health crisis, call the [facility] [facility] Crisis Line at [phone]). The line is available 24 hours a day, seven days a week.  If you would like help to quit smoking, call 1-800-QUIT-NOW [phone])616-7904) OR Espaol: 1-855-Djelo-Ya (6-644-034-7425) o para ms informacin haga clic aqu or Text READY to 956-387 to register via text  Ms. Odis Luster - following are the goals we discussed in your visit today:   Goals Addressed   None       The  Patient                                              has been provided with contact information for the Managed Medicaid care management team and has been advised to call with any health related questions or concerns.   Gus Puma, Kenard Gower, MHA [facility]  Managed Medicaid Social Worker [phone]   Following is a copy of your plan of care:  There are no care plans that you recently modified to display for this patient.

## 2022-10-29 ENCOUNTER — Other Ambulatory Visit (INDEPENDENT_AMBULATORY_CARE_PROVIDER_SITE_OTHER): Payer: Self-pay | Admitting: Primary Care

## 2022-10-29 NOTE — Telephone Encounter (Signed)
Requested medication (s) are due for refill today: yes  Requested medication (s) are on the active medication list: yes  Last refill:  08/08/22 #12/0  Future visit scheduled: yes  Notes to clinic:  Unable to refill per protocol, cannot delegate.    Requested Prescriptions  Pending Prescriptions Disp Refills   Vitamin D, Ergocalciferol, (DRISDOL) 1.25 MG (50000 UNIT) CAPS capsule [Pharmacy Med Name: VITAMIN D2 50,000IU (ERGO) CAP RX] 12 capsule 0    Sig: TAKE 1 CAPSULE BY MOUTH EVERY 7 DAYS     Endocrinology:  Vitamins - Vitamin D Supplementation 2 Failed - 10/29/2022  3:55 AM      Failed - Manual Review: Route requests for 50,000 IU strength to the provider      Failed - Ca in normal range and within 360 days    Calcium  Date Value Ref Range Status  09/14/2022 8.6 (L) 8.9 - 10.3 mg/dL Final         Failed - Vitamin D in normal range and within 360 days    Vit D, 25-Hydroxy  Date Value Ref Range Status  08/06/2022 5.4 (L) 30.0 - 100.0 ng/mL Final    Comment:    Vitamin D deficiency has been defined by the Institute of Medicine and an Endocrine Society practice guideline as a level of serum 25-OH vitamin D less than 20 ng/mL (1,2). The Endocrine Society went on to further define vitamin D insufficiency as a level between 21 and 29 ng/mL (2). 1. IOM (Institute of Medicine). 2010. Dietary reference    intakes for calcium and D. [place]: The    [employer]. 2. Holick MF, Binkley Donald, Bischoff-Ferrari HA, et al.    Evaluation, treatment, and prevention of vitamin D    deficiency: an Endocrine Society clinical practice    guideline. JCEM. 2011 Jul; 96(7):1911-30.          Passed - Valid encounter within last 12 months    Recent Outpatient Visits           3 months ago BP check   [place] [facility] Grayce Sessions, NP   4 months ago Respiratory symptoms   [place] [facility] Grayce Sessions, NP   6 months  ago Bilateral low back pain, unspecified chronicity, unspecified whether sciatica present   [place] [facility] Grayce Sessions, NP   7 months ago Moderate episode of recurrent major depressive disorder [facility])   [place] [facility] Grayce Sessions, NP   8 months ago Bilateral low back pain, unspecified chronicity, unspecified whether sciatica present   [place] [facility] Grayce Sessions, NP       Future Appointments             In 1 week Randa Evens Kinnie Scales, NP  [facility]   In 1 month Yates Decamp, MD [facility]

## 2022-11-06 ENCOUNTER — Other Ambulatory Visit: Payer: Self-pay | Admitting: Cardiology

## 2022-11-06 ENCOUNTER — Encounter (INDEPENDENT_AMBULATORY_CARE_PROVIDER_SITE_OTHER): Payer: Self-pay | Admitting: Primary Care

## 2022-11-06 ENCOUNTER — Ambulatory Visit (INDEPENDENT_AMBULATORY_CARE_PROVIDER_SITE_OTHER): Payer: No Typology Code available for payment source | Admitting: Primary Care

## 2022-11-06 VITALS — BP 122/86 | HR 66 | Resp 16 | Wt 282.8 lb

## 2022-11-06 DIAGNOSIS — I10 Essential (primary) hypertension: Secondary | ICD-10-CM

## 2022-11-06 DIAGNOSIS — F17200 Nicotine dependence, unspecified, uncomplicated: Secondary | ICD-10-CM

## 2022-11-06 DIAGNOSIS — M545 Low back pain, unspecified: Secondary | ICD-10-CM

## 2022-11-06 DIAGNOSIS — Z760 Encounter for issue of repeat prescription: Secondary | ICD-10-CM

## 2022-11-06 DIAGNOSIS — F4323 Adjustment disorder with mixed anxiety and depressed mood: Secondary | ICD-10-CM

## 2022-11-06 MED ORDER — LOSARTAN POTASSIUM-HCTZ 100-25 MG PO TABS
1.0000 | ORAL_TABLET | Freq: Every day | ORAL | 1 refills | Status: DC
Start: 2022-11-06 — End: 2023-07-08

## 2022-11-06 MED ORDER — BUSPIRONE HCL 5 MG PO TABS
5.0000 mg | ORAL_TABLET | Freq: Two times a day (BID) | ORAL | 1 refills | Status: DC
Start: 2022-11-06 — End: 2023-03-01

## 2022-11-06 NOTE — Patient Instructions (Signed)
Myofascial Pain Syndrome and Fibromyalgia Myofascial pain syndrome and fibromyalgia are both pain disorders. You may feel this pain mainly in your muscles. Myofascial pain syndrome: Always has tender points in the muscles that will cause pain when pressed (trigger points). The pain may come and go. Usually affects your neck, upper back, and shoulder areas. The pain often moves into your arms and hands. Fibromyalgia: Has muscle pains and tenderness that come and go. Is often associated with tiredness (fatigue) and sleep problems. Has trigger points. Tends to be long-lasting (chronic), but is not life-threatening. Fibromyalgia and myofascial pain syndrome are not the same. However, they often occur together. If you have both conditions, each can make the other worse. Both are common and can cause enough pain and fatigue to make day-to-day activities difficult. Both can be hard to diagnose because their symptoms are common in many other conditions. What are the causes? The exact causes of these conditions are not known. What increases the risk? You are more likely to develop either of these conditions if: You have a family history of the condition. You are female. You have certain triggers, such as: Spine disorders. An injury (trauma) or other physical stressors. Being under a lot of stress. Medical conditions such as osteoarthritis, rheumatoid arthritis, or lupus. What are the signs or symptoms? Fibromyalgia The main symptom of fibromyalgia is widespread pain and tenderness in your muscles. Pain is sometimes described as stabbing, shooting, or burning. You may also have: Tingling or numbness. Sleep problems and fatigue. Problems with attention and concentration (fibro fog). Other symptoms may include: Bowel and bladder problems. Headaches. Vision problems. Sensitivity to odors and noises. Depression or mood changes. Painful menstrual periods (dysmenorrhea). Dry skin or eyes. These  symptoms can vary over time. Myofascial pain syndrome Symptoms of myofascial pain syndrome include: Tight, ropy bands of muscle. Uncomfortable sensations in muscle areas. These may include aching, cramping, burning, numbness, tingling, and weakness. Difficulty moving certain parts of the body freely (poor range of motion). How is this diagnosed? This condition may be diagnosed by your symptoms and medical history. You will also have a physical exam. In general: Fibromyalgia is diagnosed if you have pain, fatigue, and other symptoms for more than 3 months, and symptoms cannot be explained by another condition. Myofascial pain syndrome is diagnosed if you have trigger points in your muscles, and those trigger points are tender and cause pain elsewhere in your body (referred pain). How is this treated? Treatment for these conditions depends on the type that you have. For fibromyalgia, a healthy lifestyle is the most important treatment including aerobic and strength exercises. Different types of medicines are used to help treat pain and include: NSAIDs. Medicines for treating depression. Medicines that help control seizures. Medicines that relax the muscles. Treatment for myofascial pain syndrome includes: Pain medicines, such as NSAIDs. Cooling and stretching of muscles. Massage therapy with myofascial release technique. Trigger point injections. Treating these conditions often requires a team of health care providers. These may include: Your primary care provider. A physical therapist. Complementary health care providers, such as massage therapists or acupuncturists. A psychiatrist for cognitive behavioral therapy. Follow these instructions at home: Medicines Take over-the-counter and prescription medicines only as told by your health care provider. Ask your health care provider if the medicine prescribed to you: Requires you to avoid driving or using machinery. Can cause constipation.  You may need to take these actions to prevent or treat constipation: Drink enough fluid to keep your urine pale   yellow. Take over-the-counter or prescription medicines. Eat foods that are high in fiber, such as beans, whole grains, and fresh fruits and vegetables. Limit foods that are high in fat and processed sugars, such as fried or sweet foods. Lifestyle  Do exercises as told by your health care provider or physical therapist. Practice relaxation techniques to control your stress. You may want to try: Biofeedback. Visual imagery. Hypnosis. Muscle relaxation. Yoga. Meditation. Maintain a healthy lifestyle. This includes eating a healthy diet and getting enough sleep. Do not use any products that contain nicotine or tobacco. These products include cigarettes, chewing tobacco, and vaping devices, such as e-cigarettes. If you need help quitting, ask your health care provider. General instructions Talk to your health care provider about complementary treatments, such as acupuncture or massage. Do not do activities that stress or strain your muscles. This includes repetitive motions and heavy lifting. Keep all follow-up visits. This is important. Where to find support Consider joining a support group with others who are diagnosed with this condition. National Fibromyalgia Association: fmaware.org Where to find more information U.S. Pain Foundation: uspainfoundation.org Contact a health care provider if: You have new symptoms. Your symptoms get worse or your pain is severe. You have side effects from your medicines. You have trouble sleeping. Your condition is causing depression or anxiety. Get help right away if: You have thoughts of hurting yourself or others. Get help right away if you feel like you may hurt yourself or others, or have thoughts about taking your own life. Go to your nearest emergency room or: Call 911. Call the National Suicide Prevention Lifeline at 1-800-273-8255  or 988. This is open 24 hours a day. Text the Crisis Text Line at 741741. This information is not intended to replace advice given to you by your health care provider. Make sure you discuss any questions you have with your health care provider. Document Revised: 03/04/2022 Document Reviewed: 04/27/2021 Elsevier Patient Education  2024 [employer].  

## 2022-11-06 NOTE — Progress Notes (Signed)
[facility]  Subjective: CC: back pain PCP: Grayce Sessions, NP HPI: Patient is a 57 y.o. female presenting to clinic today for back pain., knee, ankles  Concerns today include:  1. Back Pain Patient reports that pain began 2 months.  She has a h/o back pain.  Pain is a 10/10.  It no radiate.  Washing dishes , activities for a short ( 10 mins) of time  worsens pain.  Opiate improves pain.  Patient has been taking ibuprofen for pain with moderate relief.  Patient denies trauma or injury.  She denies dysuria, hematuria, fevers, chills, nausea, vomiting, abdominal pain, renal stones. Aggravating factors: certain movements and prolonged walking/standing. Alleviating factors: rest. Progressive LE weakness or saddle anesthesia: none. Extremity sensation changes or weakness: none. Ambulatory without difficulty. Normal bowel/bladder habits: yes; without urinary retention. Normal PO intake without n/v. No associated abdominal pain/n/v. Self treatment: has OTC analgesics, with minimal relief. Patient denies: urinary retention/incontinence, bowel incontinence, weakness, falls, sensation changes or pain anywhere else. No h/o back surgeries.    Current Outpatient Medications:    amLODipine (NORVASC) 10 MG tablet, TAKE ONE TABLET BY MOUTH DAILY, Disp: 90 tablet, Rfl: 1   aspirin (ASPIRIN CHILDRENS) 81 MG chewable tablet, Chew 1 tablet (81 mg total) by mouth daily., Disp: 90 tablet, Rfl: 1   atorvastatin (LIPITOR) 20 MG tablet, Take 1 tablet (20 mg total) by mouth daily., Disp: 90 tablet, Rfl: 1   buPROPion (WELLBUTRIN SR) 150 MG 12 hr tablet, TAKE 1 TABLET(150 MG) BY MOUTH TWICE DAILY IN THE MORNING AND LATE AFTERNOON, Disp: 60 tablet, Rfl: 2   busPIRone (BUSPAR) 5 MG tablet, Take 1 tablet (5 mg total) by mouth 2 (two) times daily., Disp: 60 tablet, Rfl: 2   clopidogrel (PLAVIX) 75 MG tablet, Take 1 tablet (75 mg total) by mouth daily., Disp: 90 tablet, Rfl: 1   diclofenac Sodium  (VOLTAREN) 1 % GEL, Apply 4 g topically 4 (four) times daily., Disp: 100 g, Rfl: 0   gabapentin (NEURONTIN) 100 MG capsule, TAKE 1 CAPSULE(100 MG) BY MOUTH THREE TIMES DAILY, Disp: 270 capsule, Rfl: 1   HYDROcodone-acetaminophen (NORCO/VICODIN) 5-325 MG tablet, Take 1 tablet by mouth every 6 (six) hours as needed for severe pain., Disp: 10 tablet, Rfl: 0   ibuprofen (ADVIL) 800 MG tablet, Take 800 mg by mouth every 8 (eight) hours as needed for moderate pain or mild pain., Disp: , Rfl:    losartan-hydrochlorothiazide (HYZAAR) 100-25 MG tablet, Take 1 tablet by mouth daily., Disp: 90 tablet, Rfl: 1   nicotine (NICODERM CQ) 21 mg/24hr patch, Place 1 patch (21 mg total) onto the skin daily., Disp: 28 patch, Rfl: 1   ondansetron (ZOFRAN-ODT) 8 MG disintegrating tablet, Take 1 tablet (8 mg total) by mouth every 8 (eight) hours as needed., Disp: 8 tablet, Rfl: 0   oxyCODONE-acetaminophen (PERCOCET) 5-325 MG tablet, Take 1 tablet by mouth every 4 (four) hours as needed for severe pain., Disp: 20 tablet, Rfl: 0   Semaglutide (RYBELSUS) 3 MG TABS, Take 1 tablet (3 mg total) by mouth daily., Disp: 30 tablet, Rfl: 0   Vitamin D, Ergocalciferol, (DRISDOL) 1.25 MG (50000 UNIT) CAPS capsule, Take 1 capsule (50,000 Units total) by mouth every 7 (seven) days. (Patient taking differently: Take 50,000 Units by mouth every Monday.), Disp: 12 capsule, Rfl: 0 No Known Allergies  Past Medical History:  Diagnosis Date   Anxiety    Arthritis    Patient verbalizes she has athritis to right  ankle   Dyspnea    Frozen shoulder syndrome 07/30/2019   Hyperlipidemia    Hypertension    Obesity    PAD (peripheral artery disease) (HCC)    Pre-diabetes    Social History   Socioeconomic History   Marital status: Divorced    Spouse name: Not on file   Number of children: 0   Years of education: Not on file   Highest education level: 12th grade  Occupational History   Not on file  Tobacco Use   Smoking status: Some  Days    Packs/day: 0.15    Years: 37.00    Additional pack years: 0.00    Total pack years: 5.55    Types: Cigarettes    Start date: 17   Smokeless tobacco: Never   Tobacco comments:    Started on Wellbutrin for depression  Vaping Use   Vaping Use: Never used  Substance and Sexual Activity   Alcohol use: Not Currently    Comment: 12/22/2017 "maybe twice a year"   Drug use: Never   Sexual activity: Yes    Birth control/protection: None  Other Topics Concern   Not on file  Social History Narrative   Not on file   Social Determinants of Health   Financial Resource Strain: High Risk (11/05/2022)   Overall Financial Resource Strain (CARDIA)    Difficulty of Paying Living Expenses: Hard  Food Insecurity: Food Insecurity Present (11/05/2022)   Hunger Vital Sign    Worried About Running Out of Food in the Last Year: Sometimes true    Ran Out of Food in the Last Year: Sometimes true  Transportation Needs: No Transportation Needs (11/05/2022)   PRAPARE - Administrator, Civil Service (Medical): No    Lack of Transportation (Non-Medical): No  Physical Activity: Unknown (11/05/2022)   Exercise Vital Sign    Days of Exercise per Week: 0 days    Minutes of Exercise per Session: Not on file  Stress: Stress Concern Present (11/05/2022)   [employer] of Occupational Health - Occupational Stress Questionnaire    Feeling of Stress : To some extent  Social Connections: Unknown (11/05/2022)   Social Connection and Isolation Panel [NHANES]    Frequency of Communication with Friends and Family: Once a week    Frequency of Social Gatherings with Friends and Family: Patient declined    Attends Religious Services: Never    Database administrator or Organizations: No    Attends Engineer, structural: Not on file    Marital Status: Living with partner  Intimate Partner Violence: Not At Risk (09/30/2022)   Humiliation, Afraid, Rape, and Kick questionnaire    Fear of Current  or Ex-Partner: No    Emotionally Abused: No    Physically Abused: No    Sexually Abused: No   Past Surgical History:  Procedure Laterality Date   ABDOMINAL AORTOGRAM W/LOWER EXTREMITY Bilateral 08/16/2022   Procedure: ABDOMINAL AORTOGRAM W/LOWER EXTREMITY;  Surgeon: Yates Decamp, MD;  Location: [facility];  Service: Cardiovascular;  Laterality: Bilateral;   ABDOMINAL AORTOGRAM W/LOWER EXTREMITY N/A 09/06/2022   Procedure: ABDOMINAL AORTOGRAM W/LOWER EXTREMITY;  Surgeon: Yates Decamp, MD;  Location: [facility];  Service: Cardiovascular;  Laterality: N/A;   CHOLECYSTECTOMY N/A 09/30/2022   Procedure: LAPAROSCOPIC CHOLECYSTECTOMY;  Surgeon: Quentin Ore, MD;  Location: [facility];  Service: General;  Laterality: N/A;   ECTOPIC PREGNANCY SURGERY  X 2   PERIPHERAL VASCULAR ATHERECTOMY Right 08/16/2022  Procedure: PERIPHERAL VASCULAR ATHERECTOMY;  Surgeon: Yates Decamp, MD;  Location: [facility] INVASIVE CV LAB;  Service: Cardiovascular;  Laterality: Right;  R SFA   PERIPHERAL VASCULAR ATHERECTOMY  09/06/2022   Procedure: PERIPHERAL VASCULAR ATHERECTOMY;  Surgeon: Yates Decamp, MD;  Location: [facility];  Service: Cardiovascular;;   PERIPHERAL VASCULAR BALLOON ANGIOPLASTY  08/16/2022   Procedure: PERIPHERAL VASCULAR BALLOON ANGIOPLASTY;  Surgeon: Yates Decamp, MD;  Location: [facility];  Service: Cardiovascular;;   PERIPHERAL VASCULAR INTERVENTION  09/06/2022   Procedure: PERIPHERAL VASCULAR INTERVENTION;  Surgeon: Yates Decamp, MD;  Location: [facility];  Service: Cardiovascular;;    ROS: per HPI  Objective: Office vital signs reviewed. Blood Pressure 122/86   Pulse 66   Respiration 16   Weight 282 lb 12.8 oz (128.3 kg)   Last Menstrual Period 01/12/2015   Oxygen Saturation 100%   Body Mass Index 43.00 kg/m   Physical Examination:  Physical exam: General: Vital signs reviewed.  Patient is well-developed and well-nourished, morbid obesity  in no acute distress and  cooperative with exam. Head: Normocephalic and atraumatic. Eyes: EOMI, conjunctivae normal, no scleral icterus. Neck: Supple, trachea midline, normal ROM, no JVD, masses, thyromegaly, or carotid bruit present. Cardiovascular: RRR, S1 normal, S2 normal, no murmurs, gallops, or rubs. Pulmonary/Chest: Clear to auscultation bilaterally, no wheezes, rales, or rhonchi. Abdominal: Soft, non-tender, non-distended, BS +, no masses, organomegaly, or guarding present. Musculoskeletal: No joint deformities, erythema, or stiffness, ROM full and nontender. Extremities: No lower extremity edema bilaterally,  pulses symmetric and intact bilaterally. No cyanosis or clubbing. Neurological: A&O x3, Strength is normal Skin: Warm, dry and intact. No rashes or erythema. Psychiatric: Normal mood and affect. speech and behavior is normal. Cognition and memory are normal.    Assessment/ Plan: Sharon Dyer was seen today for ankle pain, knee pain and back pain.  Diagnoses and all orders for this visit:  Bilateral low back pain, unspecified chronicity, unspecified whether sciatica present -     Ambulatory referral to Orthopedic Surgery  Adjustment disorder with mixed anxiety and depressed mood -     busPIRone (BUSPAR) 5 MG tablet; Take 1 tablet (5 mg total) by mouth 2 (two) times daily.  Medication refill -     losartan-hydrochlorothiazide (HYZAAR) 100-25 MG tablet; Take 1 tablet by mouth daily.  Essential hypertension Well controlled BP goal - < 130/80 Explained that having normal blood pressure is the goal and medications are helping to get to goal and maintain normal blood pressure. DIET: Limit salt intake, read nutrition labels to check salt content, limit fried and high fatty foods  Avoid using multisymptom OTC cold preparations that generally contain sudafed which can rise BP. Consult with pharmacist on best cold relief products to use for persons with HTN EXERCISE Discussed incorporating exercise such as  walking - 30 minutes most days of the week and can do in 10 minute intervals    -     losartan-hydrochlorothiazide (HYZAAR) 100-25 MG tablet; Take 1 tablet by mouth daily.     The above assessment and management plan was discussed with the patient. The patient verbalized understanding of and has agreed to the management plan. Patient is aware to call the clinic if symptoms persist or worsen. Patient is aware when to return to the clinic for a follow-up visit. Patient educated on when it is appropriate to go to the emergency department.   This note has been created with Education officer, environmental. Any transcriptional  errors are unintentional.   Grayce Sessions, NP 11/06/2022, 4:12 PM

## 2022-11-19 ENCOUNTER — Other Ambulatory Visit: Payer: Self-pay | Admitting: Primary Care

## 2022-11-19 DIAGNOSIS — Z1231 Encounter for screening mammogram for malignant neoplasm of breast: Secondary | ICD-10-CM

## 2022-11-20 ENCOUNTER — Other Ambulatory Visit (INDEPENDENT_AMBULATORY_CARE_PROVIDER_SITE_OTHER): Payer: Medicaid Other

## 2022-11-20 ENCOUNTER — Encounter: Payer: Self-pay | Admitting: Orthopedic Surgery

## 2022-11-20 ENCOUNTER — Ambulatory Visit: Payer: Medicaid Other | Admitting: Orthopedic Surgery

## 2022-11-20 VITALS — Ht 68.0 in | Wt 282.0 lb

## 2022-11-20 DIAGNOSIS — M25561 Pain in right knee: Secondary | ICD-10-CM

## 2022-11-20 DIAGNOSIS — G8929 Other chronic pain: Secondary | ICD-10-CM

## 2022-11-20 DIAGNOSIS — M25562 Pain in left knee: Secondary | ICD-10-CM

## 2022-11-20 DIAGNOSIS — M545 Low back pain, unspecified: Secondary | ICD-10-CM | POA: Diagnosis not present

## 2022-11-20 NOTE — Progress Notes (Signed)
Where  Office Visit Note   Patient: Sharon Dyer           Date of Birth: May 10, 1966           MRN: [id number] Visit Date: 11/20/2022 Requested by: Grayce Sessions, NP [address] [place],  [place] [address] PCP: Grayce Sessions, NP  Subjective: Chief Complaint  Patient presents with   Lower Back - Pain   Right Knee - Pain   Left Knee - Pain    HPI: Sharon Dyer is a 57 y.o. female who presents to the office reporting pain and low back pain.  Patient describes bilateral foot numbness from midfoot distal.  Had recent surgery on both legs.  States that her back hurts all the time especially when she has to stand for long time and cook.  Has been hurting for longer than 6 months.  The pain comes and goes.  Was taking Norco for the pain which she had leftover from another procedure which helped but she is out of that now.  Ibuprofen does not work as well.  Patient also reports bilateral knee pain.  Hurts for her to get in and out of the car.  Hurts to get up from a chair.  Denies much in the way of mechanical symptoms..                ROS: All systems reviewed are negative as they relate to the chief complaint within the history of present illness.  Patient denies fevers or chills.  Assessment & Plan: Visit Diagnoses:  1. Chronic bilateral low back pain, unspecified whether sciatica present   2. Chronic pain of both knees     Plan: Impression is low back pain with fairly significant diffuse arthritis throughout the lumbar spine.  Symptoms ongoing for 6 months refractory to conservative treatment.  Would like for her to try physical therapy 1 time a week for 6 weeks and then return office visit after that for consideration of MRI scanning at that time if not improved.  Regarding the knee she has pretty reasonable range of motion but is fairly severe arthritis based on radiographs.  Hold off on injections on those for now because her back is her primary pain generator  in terms of clinical symptoms.  Follow-Up Instructions: No follow-ups on file.   Orders:  Orders Placed This Encounter  Procedures   XR Lumbar Spine 2-3 Views   XR KNEE 3 VIEW RIGHT   XR KNEE 3 VIEW LEFT   No orders of the defined types were placed in this encounter.     Procedures: No procedures performed   Clinical Data: No additional findings.  Objective: Vital Signs: Ht 5\' 8"  (1.727 m)   Wt 282 lb (127.9 kg)   LMP 01/12/2015   BMI 42.88 kg/m   Physical Exam:  Constitutional: Patient appears well-developed HEENT:  Head: Normocephalic Eyes:EOM are normal Neck: Normal range of motion Cardiovascular: Normal rate Pulmonary/chest: Effort normal Neurologic: Patient is alert Skin: Skin is warm Psychiatric: Patient has normal mood and affect  Ortho Exam: Ortho exam demonstrates normal gait and alignment.  No groin pain on the right or left-hand side with internal and external rotation of the legs.  Pedal pulses trace palpable in both feet are warm.  5 out of 5 ankle dorsiflexion plantarflexion quad hamstring strength.  Excellent hip flexion strength bilaterally.  Both knees are examined and she has near full extension bilaterally with no effusion  and flexion easily past 90 with stable collateral and cruciate ligaments.  Specialty Comments:  No specialty comments available.  Imaging: XR Lumbar Spine 2-3 Views  Result Date: 11/20/2022 AP lateral radiographs lumbar spine reviewed.  Normal lordosis is present.  No compression fractures or spondylolisthesis.  Severe facet arthritis is present throughout the lower lumbar spine and moderate degenerative disc disease is present with spur formation but no bone-on-bone changes between the vertebral bodies.  XR KNEE 3 VIEW RIGHT  Result Date: 11/20/2022 AP lateral merchant radiographs right knee reviewed.  Severe end-stage tricompartmental arthritis is present worse in the medial compartment with slight varus alignment.  No acute  fracture.  XR KNEE 3 VIEW LEFT  Result Date: 11/20/2022 AP lateral merchant radiographs left knee reviewed.  Severe end-stage tricompartmental arthritis is present worse in the medial compartment.  No acute fracture.    PMFS History: Patient Active Problem List   Diagnosis Date Noted   Lower extremity edema 09/07/2022   Peripheral artery disease (HCC) 09/06/2022   Claudication in peripheral vascular disease (HCC) 08/16/2022   AKI (acute kidney injury) (HCC) 01/29/2018   Hypokalemia 01/29/2018   Respiratory distress 01/29/2018   Tobacco abuse 01/29/2018   Hypoxemia 12/22/2017   Past Medical History:  Diagnosis Date   Anxiety    Arthritis    Patient verbalizes she has athritis to right ankle   Dyspnea    Frozen shoulder syndrome 07/30/2019   Hyperlipidemia    Hypertension    Obesity    PAD (peripheral artery disease) (HCC)    Pre-diabetes     Family History  Problem Relation Age of Onset   Breast cancer Mother        in 13s   Hypertension Mother    Stroke Mother     Past Surgical History:  Procedure Laterality Date   ABDOMINAL AORTOGRAM W/LOWER EXTREMITY Bilateral 08/16/2022   Procedure: ABDOMINAL AORTOGRAM W/LOWER EXTREMITY;  Surgeon: Yates Decamp, MD;  Location: [facility];  Service: Cardiovascular;  Laterality: Bilateral;   ABDOMINAL AORTOGRAM W/LOWER EXTREMITY N/A 09/06/2022   Procedure: ABDOMINAL AORTOGRAM W/LOWER EXTREMITY;  Surgeon: Yates Decamp, MD;  Location: [facility];  Service: Cardiovascular;  Laterality: N/A;   CHOLECYSTECTOMY N/A 09/30/2022   Procedure: LAPAROSCOPIC CHOLECYSTECTOMY;  Surgeon: Quentin Ore, MD;  Location: [facility];  Service: General;  Laterality: N/A;   ECTOPIC PREGNANCY SURGERY  X 2   PERIPHERAL VASCULAR ATHERECTOMY Right 08/16/2022   Procedure: PERIPHERAL VASCULAR ATHERECTOMY;  Surgeon: Yates Decamp, MD;  Location[facility] CV LAB;  Service: Cardiovascular;  Laterality: Right;  R SFA   PERIPHERAL VASCULAR ATHERECTOMY   09/06/2022   Procedure: PERIPHERAL VASCULAR ATHERECTOMY;  Surgeon: Yates Decamp, MD;  Location: [facility] INVASIVE CV LAB;  Service: Cardiovascular;;   PERIPHERAL VASCULAR BALLOON ANGIOPLASTY  08/16/2022   Procedure: PERIPHERAL VASCULAR BALLOON ANGIOPLASTY;  Surgeon: Yates Decamp, MD;  Location: [facility];  Service: Cardiovascular;;   PERIPHERAL VASCULAR INTERVENTION  09/06/2022   Procedure: PERIPHERAL VASCULAR INTERVENTION;  Surgeon: Yates Decamp, MD;  Location: [facility];  Service: Cardiovascular;;   Social History   Occupational History   Not on file  Tobacco Use   Smoking status: Some Days    Packs/day: 0.15    Years: 37.00    Additional pack years: 0.00    Total pack years: 5.55    Types: Cigarettes    Start date: 72   Smokeless tobacco: Never   Tobacco comments:    Started on  Wellbutrin for depression  Vaping Use   Vaping Use: Never used  Substance and Sexual Activity   Alcohol use: Not Currently    Comment: 12/22/2017 "maybe twice a year"   Drug use: Never   Sexual activity: Yes    Birth control/protection: None

## 2022-11-21 ENCOUNTER — Ambulatory Visit
Admission: RE | Admit: 2022-11-21 | Discharge: 2022-11-21 | Disposition: A | Discharge status: Home / Self Care | Payer: Medicaid Other | Source: Ambulatory Visit

## 2022-11-21 DIAGNOSIS — Z1231 Encounter for screening mammogram for malignant neoplasm of breast: Secondary | ICD-10-CM

## 2022-11-25 ENCOUNTER — Encounter: Payer: Self-pay | Admitting: Orthopedic Surgery

## 2022-12-04 ENCOUNTER — Other Ambulatory Visit (INDEPENDENT_AMBULATORY_CARE_PROVIDER_SITE_OTHER): Payer: Self-pay | Admitting: Primary Care

## 2022-12-04 ENCOUNTER — Other Ambulatory Visit (INDEPENDENT_AMBULATORY_CARE_PROVIDER_SITE_OTHER): Payer: Self-pay

## 2022-12-04 DIAGNOSIS — Z760 Encounter for issue of repeat prescription: Secondary | ICD-10-CM

## 2022-12-04 DIAGNOSIS — I10 Essential (primary) hypertension: Secondary | ICD-10-CM

## 2022-12-04 MED ORDER — AMLODIPINE BESYLATE 10 MG PO TABS
ORAL_TABLET | ORAL | 1 refills | Status: DC
Start: 2022-12-04 — End: 2023-07-08

## 2022-12-04 MED ORDER — ATORVASTATIN CALCIUM 20 MG PO TABS: 20.0000 mg | ORAL_TABLET | Freq: Every day | ORAL | 1 refills | Status: DC

## 2022-12-13 ENCOUNTER — Encounter: Payer: Self-pay | Admitting: Cardiology

## 2022-12-16 NOTE — Telephone Encounter (Signed)
From pt

## 2022-12-17 ENCOUNTER — Encounter: Payer: Self-pay | Admitting: Cardiology

## 2022-12-17 ENCOUNTER — Ambulatory Visit: Payer: Medicaid Other | Admitting: Cardiology

## 2022-12-17 VITALS — BP 102/68 | HR 79 | Resp 16 | Ht 68.0 in | Wt 286.2 lb

## 2022-12-17 DIAGNOSIS — E559 Vitamin D deficiency, unspecified: Secondary | ICD-10-CM | POA: Insufficient documentation

## 2022-12-17 DIAGNOSIS — I739 Peripheral vascular disease, unspecified: Secondary | ICD-10-CM

## 2022-12-17 DIAGNOSIS — E7800 Pure hypercholesterolemia, unspecified: Secondary | ICD-10-CM

## 2022-12-17 DIAGNOSIS — I10 Essential (primary) hypertension: Secondary | ICD-10-CM

## 2022-12-17 MED ORDER — VITAMIN D 125 MCG (5000 UT) PO CAPS
1.0000 | ORAL_CAPSULE | Freq: Every day | ORAL | 3 refills | Status: DC
Start: 2022-12-17 — End: 2023-09-03

## 2022-12-17 NOTE — Progress Notes (Signed)
Primary Physician/Referring:  Grayce Sessions, NP  Patient ID: Sharon Dyer, female    DOB: 06-07-1966, 57 y.o.   MRN: [id number]  Chief Complaint  Patient presents with   Claudication in peripheral vascular disease   Follow-up   HPI:    EDANA AGUADO  is a 57 y.o. African-American female patient with hypertension, hyperlipidemia, tobacco use disorder, obesity presents for follow-up of HTN, peripheral arterial disease.  Patient underwent peripheral arteriogram and right SFA atherectomy followed by drug-coated balloon angioplasty on 08/15/2020 and repeat procedure on 09/06/2022 for the long CTO left SFA again with directional atherectomy followed by drug-coated balloon angioplasty.  This is a 6-week follow-up.  She has completely quit smoking.  On further questioning, she has been sneaking cigarettes here and there.  Symptoms of claudication have essentially resolved.  She lost 10 pounds in weight with Rybelsus but discontinued this 1 week ago due to nausea and vomiting.  She gained all the weight back within a week.  Past Medical History:  Diagnosis Date   Anxiety    Arthritis    Patient verbalizes she has athritis to right ankle   Dyspnea    Frozen shoulder syndrome 07/30/2019   Hyperlipidemia    Hypertension    Obesity    PAD (peripheral artery disease) (HCC)    Pre-diabetes    Past Surgical History:  Procedure Laterality Date   ABDOMINAL AORTOGRAM W/LOWER EXTREMITY Bilateral 08/16/2022   Procedure: ABDOMINAL AORTOGRAM W/LOWER EXTREMITY;  Surgeon: Yates Decamp, MD;  Location: [facility];  Service: Cardiovascular;  Laterality: Bilateral;   ABDOMINAL AORTOGRAM W/LOWER EXTREMITY N/A 09/06/2022   Procedure: ABDOMINAL AORTOGRAM W/LOWER EXTREMITY;  Surgeon: Yates Decamp, MD;  Location: [facility];  Service: Cardiovascular;  Laterality: N/A;   CHOLECYSTECTOMY N/A 09/30/2022   Procedure: LAPAROSCOPIC CHOLECYSTECTOMY;  Surgeon: Quentin Ore, MD;  Location: [facility];   Service: General;  Laterality: N/A;   ECTOPIC PREGNANCY SURGERY  X 2   PERIPHERAL VASCULAR ATHERECTOMY Right 08/16/2022   Procedure: PERIPHERAL VASCULAR ATHERECTOMY;  Surgeon: Yates Decamp, MD;  Location: [facility] INVASIVE CV LAB;  Service: Cardiovascular;  Laterality: Right;  R SFA   PERIPHERAL VASCULAR ATHERECTOMY  09/06/2022   Procedure: PERIPHERAL VASCULAR ATHERECTOMY;  Surgeon: Yates Decamp, MD;  Location: [facility] INVASIVE CV LAB;  Service: Cardiovascular;;   PERIPHERAL VASCULAR BALLOON ANGIOPLASTY  08/16/2022   Procedure: PERIPHERAL VASCULAR BALLOON ANGIOPLASTY;  Surgeon: Yates Decamp, MD;  [facility]: [facility];  Service: Cardiovascular;;   PERIPHERAL VASCULAR INTERVENTION  09/06/2022   Procedure: PERIPHERAL VASCULAR INTERVENTION;  Surgeon: Yates Decamp, MD;  Location: [facility];  Service: Cardiovascular;;   Family History  Problem Relation Age of Onset   Breast cancer Mother        in 90s   Hypertension Mother    Stroke Mother     Social History   Tobacco Use   Smoking status: Some Days    Packs/day: 0.15    Years: 37.00    Additional pack years: 0.00    Total pack years: 5.55    Types: Cigarettes    Start date: 25   Smokeless tobacco: Never   Tobacco comments:    Started on Wellbutrin for depression  Substance Use Topics   Alcohol use: Not Currently    Comment: 12/22/2017 "maybe twice a year"   Marital Status: Divorced  ROS  Review of Systems  Cardiovascular:  Negative for chest pain, claudication, dyspnea on exertion and leg swelling.  Objective      12/17/2022   10:06 AM 11/20/2022   12:59 PM 11/06/2022    3:43 PM  Vitals with BMI  Height 5\' 8"  5\' 8"    Weight 286 lbs 3 oz 282 lbs 282 lbs 13 oz  BMI 43.53 42.89   Systolic 102  122  Diastolic 68  86  Pulse 79  66   SpO2: 97 %  Physical Exam Constitutional:      Appearance: She is obese.  Neck:     Vascular: No carotid bruit or JVD.  Cardiovascular:     Rate and Rhythm: Normal rate and regular rhythm.      Pulses:          Femoral pulses are 1+ on the right side and 1+ on the left side.      Popliteal pulses are 1+ on the right side and 1+ on the left side.       Dorsalis pedis pulses are 0 on the right side and 0 on the left side.       Posterior tibial pulses are 0 on the right side and 0 on the left side.     Heart sounds: Normal heart sounds. No murmur heard.    No gallop.  Pulmonary:     Effort: Pulmonary effort is normal.     Breath sounds: Normal breath sounds.  Abdominal:     General: Bowel sounds are normal.     Palpations: Abdomen is soft.  Musculoskeletal:     Right lower leg: No edema.     Left lower leg: No edema.     Laboratory examination:   Recent Labs    09/06/22 0645 09/07/22 0118 09/14/22 0946  NA 140 140 137  K 3.6 3.7 3.4*  CL 104 106 104  CO2 27 26 24   GLUCOSE 94 107* 100*  BUN 14 17 11   CREATININE 0.95 0.92 0.84  CALCIUM 9.3 8.6* 8.6*  GFRNONAA >60 >60 >60    Lab Results  Component Value Date   GLUCOSE 100 (H) 09/14/2022   NA 137 09/14/2022   K 3.4 (L) 09/14/2022   CL 104 09/14/2022   CO2 24 09/14/2022   BUN 11 09/14/2022   CREATININE 0.84 09/14/2022   EGFR 77 08/06/2022   CALCIUM 8.6 (L) 09/14/2022   PROT 6.6 08/13/2022   ALBUMIN 3.2 (L) 08/13/2022   LABGLOB 3.1 04/15/2022   AGRATIO 1.3 04/15/2022   BILITOT 0.3 08/13/2022   ALKPHOS 61 08/13/2022   AST 39 08/13/2022   ALT 25 08/13/2022   ANIONGAP 9 09/14/2022      Lab Results  Component Value Date   ALT 25 08/13/2022   AST 39 08/13/2022   ALKPHOS 61 08/13/2022   BILITOT 0.3 08/13/2022       Latest Ref Rng & Units 08/13/2022    4:09 AM 04/15/2022   10:52 AM 10/12/2021   10:49 PM  Hepatic Function  Total Protein 6.5 - 8.1 g/dL 6.6  7.2  7.0   Albumin 3.5 - 5.0 g/dL 3.2  4.1  3.7   AST 15 - 41 U/L 39  23  17   ALT 0 - 44 U/L 25  13  11    Alk Phosphatase 38 - 126 U/L 61  72  56   Total Bilirubin 0.3 - 1.2 mg/dL 0.3  0.3  0.6     Lipid Panel Recent Labs    04/15/22 1052  09/17/22 1001  CHOL 185 142  TRIG 99  79  LDLCALC 126* 75  HDL 41 52  CHOLHDL 4.5*  --    Lp(a) checked on 09/17/22 that was 76.4.  HEMOGLOBIN A1C Lab Results  Component Value Date   HGBA1C 5.3 09/25/2022   MPG 105.41 09/25/2022   Lab Results  Component Value Date   TSH 1.240 02/05/2019    Last vitamin D Lab Results  Component Value Date   VD25OH 5.4 (L) 08/06/2022     Radiology:   Chest x-ray two-view 01/21/2019: The heart size and mediastinal contours are within normal limits. Both lungs are clear. Mild degenerative changes of the spine.  Ultrasound of the abdomen 08/11/2021: 1. Study is positive for cholelithiasis. No findings to suggest an acute cholecystitis at this time. 2.  Hepatic steatosis  Cardiac Studies:   Echocardiogram 07/25/2022:    Left ventricle: The cavity size was normal. Wall thickness was   increased in a pattern of mild LVH. There was mild focal basal   hypertrophy of the septum. Systolic function was normal. The   estimated ejection fraction was in the range of 55% to 60%. Wall   motion was normal; there were no regional wall motion  abnormalities. Doppler parameters are consistent with abnormal   left ventricular relaxation (grade 1 diastolic dysfunction).   Doppler parameters are consistent with high ventricular filling pressure. - Aortic valve: There was mild regurgitation. - Right ventricle: The cavity size was mildly dilated. - Pulmonary arteries: Systolic pressure was moderately to severely  increased. PA peak pressure: 58 mm Hg (S).  PCV MYOCARDIAL PERFUSION WITH LEXISCAN 08/13/2022   Narrative Lexiscan Tetrofosmin stress test 08/13/2022: Lexiscan nuclear stress test performed using 1-day protocol. SPECT images showed small sized, mild intensity, reversible perfusion defect in basal inferior myocardium.  Normal myocardial wall motion and thickening. Stress LVEF 60%. Low risk study.  Peripheral arteriogram and angioplasty 08/16/2022 &  09/02/2022: Abdominal aorta gram reveals presence of 2 renal arteries 1 on either side, widely patent.  There is mild atherosclerotic changes of the mid and distal abdominal aorta.  Distal abdominal aorta has a small aneurysmal dilatation.  Aorta iliac bifurcation is widely patent. Right iliac artery is tortuous.  There is a focal diffuse 80% stenosis of the right mid SFA.  Right distal popliteal and tibioperoneal trunk has focal 80% diffuse stenosis.  Three-vessel runoff below the right leg.   Left iliac artery is widely patent.  Left SFA is occluded at its origin from the proximal segment all the way to the midsegment.  Profunda gives collaterals.  Left below-knee vessels not well-visualized due to poor contrast flow.       Right SFA: HawkOne followed by drug-coated balloon angioplasty with 6.0 x 150 mm InPact Admiral balloon  Left SFA HawkOne followed by [facility] angioplasty with 6.0 x 250 mm In.Pact Admiral balloon   ABI 09/20/2022: This exam reveals normal perfusion of the right lower extremity (ABI 1.22) With mildly abnormal biphasic waveform pattern at the ankle.   This exam reveals mildly decreased perfusion of the left lower extremity, noted at the dorsalis pedis artery level (ABI 0.91) With mildly abnormal biphasic waveform pattern at the ankle. Compared to 07/23/2022, right ABI has improved from 0.73 and left ABI from 0.52. This represents successful revascularization of bilateral SFA   EKG:   EKG 08/06/2022: Sinus rhythm with borderline first-degree AV block at the rate of 66 bpm, normal axis, incomplete right bundle branch block.  Poor R progression, cannot exclude anteroseptal infarct old.  EKG 08/06/2019: Normal sinus  rhythm at rate of 89 bpm, low voltage complexes.   Medications and allergies  No Known Allergies    Current Outpatient Medications:    amLODipine (NORVASC) 10 MG tablet, TAKE ONE TABLET BY MOUTH DAILY, Disp: 90 tablet, Rfl: 1   aspirin (ASPIRIN CHILDRENS) 81 MG  chewable tablet, Chew 1 tablet (81 mg total) by mouth daily., Disp: 90 tablet, Rfl: 1   atorvastatin (LIPITOR) 20 MG tablet, Take 1 tablet (20 mg total) by mouth daily., Disp: 90 tablet, Rfl: 1   buPROPion (WELLBUTRIN SR) 150 MG 12 hr tablet, TAKE 1 TABLET(150 MG) BY MOUTH TWICE DAILY IN THE MORNING AND LATE AFTERNOON, Disp: 60 tablet, Rfl: 2   busPIRone (BUSPAR) 5 MG tablet, Take 1 tablet (5 mg total) by mouth 2 (two) times daily., Disp: 180 tablet, Rfl: 1   Cholecalciferol (VITAMIN D) 125 MCG (5000 UT) CAPS, Take 1 Capful by mouth daily., Disp: 90 capsule, Rfl: 3   diclofenac Sodium (VOLTAREN) 1 % GEL, Apply 4 g topically 4 (four) times daily., Disp: 100 g, Rfl: 0   gabapentin (NEURONTIN) 100 MG capsule, TAKE 1 CAPSULE(100 MG) BY MOUTH THREE TIMES DAILY, Disp: 270 capsule, Rfl: 1   HYDROcodone-acetaminophen (NORCO/VICODIN) 5-325 MG tablet, Take 1 tablet by mouth every 6 (six) hours as needed for severe pain., Disp: 10 tablet, Rfl: 0   ibuprofen (ADVIL) 800 MG tablet, Take 800 mg by mouth every 8 (eight) hours as needed for moderate pain or mild pain., Disp: , Rfl:    losartan-hydrochlorothiazide (HYZAAR) 100-25 MG tablet, Take 1 tablet by mouth daily., Disp: 90 tablet, Rfl: 1   nicotine (NICODERM CQ) 21 mg/24hr patch, Place 1 patch (21 mg total) onto the skin daily., Disp: 28 patch, Rfl: 1   ondansetron (ZOFRAN-ODT) 8 MG disintegrating tablet, Take 1 tablet (8 mg total) by mouth every 8 (eight) hours as needed. (Patient not taking: Reported on 12/17/2022), Disp: 8 tablet, Rfl: 0   Assessment     ICD-10-CM   1. Claudication in peripheral vascular disease (HCC)  I73.9 PCV ANKLE BRACHIAL INDEX (ABI)    2. Primary hypertension  I10     3. Hypercholesteremia  E78.00     4. Hypovitaminosis D  E55.9 Cholecalciferol (VITAMIN D) 125 MCG (5000 UT) CAPS       No orders of the defined types were placed in this encounter.  Meds ordered this encounter  Medications   Cholecalciferol (VITAMIN D) 125  MCG (5000 UT) CAPS    Sig: Take 1 Capful by mouth daily.    Dispense:  90 capsule    Refill:  3   Medications Discontinued During This Encounter  Medication Reason   oxyCODONE-acetaminophen (PERCOCET) 5-325 MG tablet    Semaglutide (RYBELSUS) 3 MG TABS    Vitamin D, Ergocalciferol, (DRISDOL) 1.25 MG (50000 UNIT) CAPS capsule    clopidogrel (PLAVIX) 75 MG tablet Completed Course      Recommendations:   TANELLE LANZO is a 57 y.o. African-American female patient with hypertension, hyperlipidemia, tobacco use disorder, obesity presents for follow-up of peripheral arterial disease.  Patient underwent peripheral arteriogram and right SFA atherectomy followed by drug-coated balloon angioplasty on 08/15/2020 and repeat procedure on 09/06/2022 for the long CTO left SFA again with directional atherectomy followed by drug-coated balloon angioplasty.  1. Claudication in peripheral vascular disease [facility]) Patient has had complete resolution of claudication symptoms.  However she has not resumed her activity due to back pain.  I reviewed her ABI with the patient again,  we will repeat ABI for patency of the angioplasty site.  Unless new symptoms or worsening symptoms of claudication, I will see her back in 6 months. - PCV ANKLE BRACHIAL INDEX (ABI); Future  2. Primary hypertension Blood pressure is well-controlled she is on losartan and amlodipine, continue same.  3. Hypercholesteremia Lipids under excellent control.  Presently on Lipitor 20 mg daily.  Continue the same.  4. Hypovitaminosis D Patient has severe hypovitaminosis D.  She did complete the course of 50,000 units of vitamin D prescribed by her PCP however she did not start 5000 units supplement.  I will go ahead and send the prescription for the same.  Otherwise stable from cardiac standpoint, I will see her back in 6 months for follow-up.  She has started to smoke again here and there, complete abstinence again discussed with the  patient.  I had given her a sample prescription for Rybelsus, she lost 10 pounds in weight but discontinued the medication due to nausea and vomiting but on further questioning, patient states that as long as she ate very small or ate very little she was doing well but felt like eating more food and hence got sick.  She wants to try this again.  - Cholecalciferol (VITAMIN D) 125 MCG (5000 UT) CAPS; Take 1 Capful by mouth daily.  Dispense: 90 capsule; Refill: 3    Yates Decamp, MD, [place] 12/17/2022, 10:39 AM Office: [phone]

## 2022-12-30 ENCOUNTER — Encounter (INDEPENDENT_AMBULATORY_CARE_PROVIDER_SITE_OTHER): Payer: Self-pay | Admitting: Primary Care

## 2022-12-31 ENCOUNTER — Encounter: Payer: Self-pay | Admitting: Orthopedic Surgery

## 2023-01-08 ENCOUNTER — Ambulatory Visit: Payer: Medicaid Other

## 2023-01-08 DIAGNOSIS — I739 Peripheral vascular disease, unspecified: Secondary | ICD-10-CM

## 2023-01-10 ENCOUNTER — Other Ambulatory Visit (INDEPENDENT_AMBULATORY_CARE_PROVIDER_SITE_OTHER): Payer: Medicaid Other

## 2023-01-10 ENCOUNTER — Ambulatory Visit: Payer: Medicaid Other | Admitting: Surgical

## 2023-01-10 DIAGNOSIS — M79602 Pain in left arm: Secondary | ICD-10-CM

## 2023-01-10 MED ORDER — PREDNISONE 5 MG (21) PO TBPK: ORAL_TABLET | ORAL | 0 refills | Status: DC

## 2023-01-11 ENCOUNTER — Encounter: Payer: Self-pay | Admitting: Surgical

## 2023-01-11 NOTE — Progress Notes (Signed)
Office Visit Note   Patient: Sharon Dyer           Date of Birth: May 15, 1966           MRN: [id number] Visit Date: 01/10/2023 Requested by: Grayce Sessions, NP [address] [place],  [place] [address] PCP: Grayce Sessions, NP  Subjective: Chief Complaint  Patient presents with   Left Shoulder - Pain   Neck - Pain    HPI: Sharon Dyer is a 57 y.o. female who presents to the office reporting left shoulder pain.  Patient states that she has had symptoms for about 6 weeks that have been steadily worsening over time without any relief from ibuprofen, activity modification, home exercise program based on neck/shoulder exercises she has researched from a physical therapist.  She describes scapular pain primarily in the left shoulder blade with some radiation down into the elbow at times.  All of this pain is worse with moving her neck around.  She denies any numbness or tingling.  Does have some focal neck pain as well.  Pain will wake her up at night and she describes pretty much constant pain that is not activity based.  No history of prior neck or shoulder surgery.  Has had a history of left frozen shoulder.  She denies any decreased range of motion.  No history of diabetes but she does smoke.  She does not take any blood thinners aside from 81 mg aspirin.  No history of injury recently..                ROS: All systems reviewed are negative as they relate to the chief complaint within the history of present illness.  Patient denies fevers or chills.  Assessment & Plan: Visit Diagnoses:  1. Left arm pain     Plan: Patient is a 57 year old female who presents for evaluation of left shoulder pain that is primarily localizing to the scapular region.  Steadily worsening symptoms that have failed conservative management.  This seems to be primarily related to cervical radiculopathy given the reproduction of pain with neck range of motion but no reproduction of pain with shoulder  range of motion.  She has no restriction of her shoulder range of motion and excellent rotator cuff strength.  Plan for further evaluation with MRI of the cervical spine.  She had radiographs of the shoulder and neck today demonstrating no significant acute findings of the left shoulder but she does have some mild degenerative changes at multiple levels in the cervical spine with loss of lordosis.  Follow-up after MRI to review results.  Steroid Dosepak was prescribed to help with some temporary symptomatic relief hopefully.  Follow-Up Instructions: No follow-ups on file.   Orders:  Orders Placed This Encounter  Procedures   XR Shoulder Left   XR Cervical Spine 2 or 3 views   MR Cervical Spine w/o contrast   Meds ordered this encounter  Medications   predniSONE (STERAPRED UNI-PAK 21 TAB) 5 MG (21) TBPK tablet    Sig: Take dosepak as directed    Dispense:  21 tablet    Refill:  0      Procedures: No procedures performed   Clinical Data: No additional findings.  Objective: Vital Signs: LMP 01/12/2015   Physical Exam:  Constitutional: Patient appears well-developed HEENT:  Head: Normocephalic Eyes:EOM are normal Neck: Normal range of motion Cardiovascular: Normal rate Pulmonary/chest: Effort normal Neurologic: Patient is alert Skin: Skin is warm Psychiatric: Patient has  normal mood and affect  Ortho Exam: Ortho exam demonstrates left shoulder with well-preserved passive and active range of motion.  Excellent rotator cuff strength of supra, infra, subscap rated 5/5.  Axillary nerve is intact with deltoid firing.  She has intact EPL, FPL, finger abduction, pronation/supination, bicep, tricep, deltoid.  No tenderness over the bicipital groove or AC joint.  She has positive Spurling sign.  Positive Lhermitte sign.  She has reproduction of radiating pain down the arm and in her shoulder blade with any range of motion of her cervical spine but especially when she looks up towards the  ceiling and when looking to her left.  Tenderness throughout the axial cervical spine.  Specialty Comments:  No specialty comments available.  Imaging: No results found.   PMFS History: Patient Active Problem List   Diagnosis Date Noted   Hypovitaminosis D 12/17/2022   Lower extremity edema 09/07/2022   Peripheral artery disease (HCC) 09/06/2022   Claudication in peripheral vascular disease (HCC) 08/16/2022   AKI (acute kidney injury) (HCC) 01/29/2018   Hypokalemia 01/29/2018   Respiratory distress 01/29/2018   Tobacco abuse 01/29/2018   Hypoxemia 12/22/2017   Past Medical History:  Diagnosis Date   Anxiety    Arthritis    Patient verbalizes she has athritis to right ankle   Dyspnea    Frozen shoulder syndrome 07/30/2019   Hyperlipidemia    Hypertension    Obesity    PAD (peripheral artery disease) (HCC)    Pre-diabetes     Family History  Problem Relation Age of Onset   Breast cancer Mother        in 72s   Hypertension Mother    Stroke Mother     Past Surgical History:  Procedure Laterality Date   ABDOMINAL AORTOGRAM W/LOWER EXTREMITY Bilateral 08/16/2022   Procedure: ABDOMINAL AORTOGRAM W/LOWER EXTREMITY;  Surgeon: Yates Decamp, MD;  Location: [facility];  Service: Cardiovascular;  Laterality: Bilateral;   ABDOMINAL AORTOGRAM W/LOWER EXTREMITY N/A 09/06/2022   Procedure: ABDOMINAL AORTOGRAM W/LOWER EXTREMITY;  Surgeon: Yates Decamp, MD;  Location: [facility];  Service: Cardiovascular;  Laterality: N/A;   CHOLECYSTECTOMY N/A 09/30/2022   Procedure: LAPAROSCOPIC CHOLECYSTECTOMY;  Surgeon: Quentin Ore, MD;  Location: [facility];  Service: General;  Laterality: N/A;   ECTOPIC PREGNANCY SURGERY  X 2   PERIPHERAL VASCULAR ATHERECTOMY Right 08/16/2022   Procedure: PERIPHERAL VASCULAR ATHERECTOMY;  Surgeon: Yates Decamp, MD;  Location[facility] INVAS[facility];  Service: Cardiovascular;  Laterality: Right;  R SFA   PERIPHERAL VASCULAR ATHERECTOMY  09/06/2022    Procedure: PERIPHERAL VASCULAR ATHERECTOMY;  Surgeon: Yates Decamp, MD;  Location: [facility] INVASIVE CV LAB;  Service: Cardiovascular;;   PERIPHERAL VASCULAR BALLOON ANGIOPLASTY  08/16/2022   Procedure: PERIPHERAL VASCULAR BALLOON ANGIOPLASTY;  Surgeon: Yates Decamp, MD;  [facility]: [facility];  Service: Cardiovascular;;   PERIPHERAL VASCULAR INTERVENTION  09/06/2022   Procedure: PERIPHERAL VASCULAR INTERVENTION;  Surgeon: Yates Decamp, MD;  Location: [facility];  Service: Cardiovascular;;   Social History   Occupational History   Not on file  Tobacco Use   Smoking status: Some Days    Current packs/day: 0.15    Average packs/day: 0.2 packs/day for 41.6 years (6.2 ttl pk-yrs)    Types: Cigarettes    Start date: 64   Smokeless tobacco: Never   Tobacco comments:    Started on Wellbutrin for depression  Vaping Use   Vaping status: Never Used  Substance and Sexual Activity  Alcohol use: Not Currently    Comment: 12/22/2017 "maybe twice a year"   Drug use: Never   Sexual activity: Yes    Birth control/protection: None

## 2023-01-19 NOTE — Progress Notes (Signed)
ABI 01/08/2023: This exam reveals normal perfusion of the right lower extremity (ABI 1.20) with mildly abnormal biphasic waveform at the right ankle.   This exam reveals normal perfusion of the left lower extremity (ABI 1.14) with markedly abnormal monophasic waveform at the left ankle.  Compared to the study done on 09/20/2022, no change in right ABI and left ABI has improved from 0.91-1.14.  This represents continued patency of the bilateral SFA angioplasty.

## 2023-01-23 ENCOUNTER — Other Ambulatory Visit: Payer: Self-pay | Admitting: Surgical

## 2023-01-23 MED ORDER — GABAPENTIN 300 MG PO CAPS: 300.0000 mg | ORAL_CAPSULE | Freq: Three times a day (TID) | ORAL | 0 refills | Status: DC

## 2023-01-23 MED ORDER — ACETAMINOPHEN-CODEINE 300-30 MG PO TABS: 1.0000 | ORAL_TABLET | Freq: Two times a day (BID) | ORAL | 0 refills | Status: DC | PRN

## 2023-01-23 NOTE — Telephone Encounter (Signed)
Can try tylenol #3 and gabapentin as a temporary pain measure until she gets MRI

## 2023-02-02 ENCOUNTER — Other Ambulatory Visit: Payer: Self-pay | Admitting: Surgical

## 2023-02-02 MED ORDER — GABAPENTIN 300 MG PO CAPS: 300.0000 mg | ORAL_CAPSULE | Freq: Three times a day (TID) | ORAL | 0 refills | Status: DC

## 2023-02-02 MED ORDER — ACETAMINOPHEN-CODEINE 300-30 MG PO TABS: 1.0000 | ORAL_TABLET | Freq: Two times a day (BID) | ORAL | 0 refills | Status: DC | PRN

## 2023-02-02 NOTE — Telephone Encounter (Signed)
Sent in

## 2023-02-05 ENCOUNTER — Other Ambulatory Visit: Payer: Medicaid Other

## 2023-02-05 ENCOUNTER — Telehealth: Payer: Self-pay | Admitting: Surgical

## 2023-02-05 NOTE — Telephone Encounter (Signed)
Pt stated she called this morning to cancel her f/u appt that was on Friday here in office, due to her having to cancel her MRI appt today. Pt talked with her insurance and they informed her that her physician can do a peer to peer request to expedite the insurance approval for the MRI to be done. Pt phone number [phone]

## 2023-02-06 NOTE — Telephone Encounter (Signed)
I called the patient to let her know the MRI has been authorized, just as of this morning. I sent a message to [facility] with GI, asking her to please contact the patient and get her rescheduled for an appt asap. Advised the patient to either call to schedule the MRI f/u with Dr. August Saucer, or she can schedule through MyChart, once she has been given a new exam date.

## 2023-02-07 ENCOUNTER — Ambulatory Visit: Payer: Medicaid Other | Admitting: Orthopedic Surgery

## 2023-02-12 ENCOUNTER — Other Ambulatory Visit: Payer: Self-pay | Admitting: Surgical

## 2023-02-12 MED ORDER — ACETAMINOPHEN-CODEINE 300-30 MG PO TABS: 1.0000 | ORAL_TABLET | Freq: Two times a day (BID) | ORAL | 0 refills | Status: DC | PRN

## 2023-02-18 ENCOUNTER — Other Ambulatory Visit: Payer: Self-pay | Admitting: Cardiology

## 2023-02-21 ENCOUNTER — Ambulatory Visit
Admission: RE | Admit: 2023-02-21 | Discharge: 2023-02-21 | Disposition: A | Discharge status: Home / Self Care | Payer: Medicaid Other | Source: Ambulatory Visit | Attending: Surgical | Admitting: Surgical

## 2023-02-21 DIAGNOSIS — M79602 Pain in left arm: Secondary | ICD-10-CM

## 2023-02-24 ENCOUNTER — Telehealth: Payer: Self-pay | Admitting: Surgical

## 2023-02-24 DIAGNOSIS — M79602 Pain in left arm: Secondary | ICD-10-CM

## 2023-02-24 NOTE — Telephone Encounter (Signed)
Hey Lauren.  I called Sharon Dyer and she is still having a lot of pain.  I described her MRI findings over the phone though the MRI has not been read by radiologist yet.  Think that she would be best served by trying a cervical spine ESI with Dr. Alvester Morin and seeing how this affects her symptoms with her scapular pain, radicular pain, numbness/tingling in her left arm.  Would you be able to put in referral for this?  She wants it done as soon as possible though I know we can only do it as schedule permits.  Also can you cancel her appointment for this Friday on 9/20 to review the MRI?  Thanks you rock

## 2023-02-25 ENCOUNTER — Encounter: Payer: Self-pay | Admitting: Cardiology

## 2023-02-25 NOTE — Addendum Note (Signed)
Addended by: Barbette Or on: 02/25/2023 09:26 AM   Modules accepted: Orders

## 2023-02-25 NOTE — Telephone Encounter (Signed)
Referral for Dr. Alvester Morin placed in chart and f/u appt. Cancelled. Brittney FYI pt would like to get this done asap

## 2023-02-26 ENCOUNTER — Other Ambulatory Visit (HOSPITAL_COMMUNITY)
Admission: RE | Admit: 2023-02-26 | Discharge: 2023-02-26 | Disposition: A | Discharge status: Home / Self Care | Payer: Medicaid Other | Source: Ambulatory Visit | Attending: Primary Care | Admitting: Primary Care

## 2023-02-26 ENCOUNTER — Other Ambulatory Visit (INDEPENDENT_AMBULATORY_CARE_PROVIDER_SITE_OTHER): Payer: Self-pay

## 2023-02-26 ENCOUNTER — Encounter (INDEPENDENT_AMBULATORY_CARE_PROVIDER_SITE_OTHER): Payer: Self-pay | Admitting: Primary Care

## 2023-02-26 ENCOUNTER — Ambulatory Visit (INDEPENDENT_AMBULATORY_CARE_PROVIDER_SITE_OTHER): Payer: Medicaid Other | Admitting: Primary Care

## 2023-02-26 VITALS — BP 108/72 | HR 66 | Resp 16 | Ht 68.0 in | Wt 289.6 lb

## 2023-02-26 DIAGNOSIS — Z124 Encounter for screening for malignant neoplasm of cervix: Secondary | ICD-10-CM

## 2023-02-26 DIAGNOSIS — Z79899 Other long term (current) drug therapy: Secondary | ICD-10-CM | POA: Diagnosis not present

## 2023-02-26 DIAGNOSIS — I10 Essential (primary) hypertension: Secondary | ICD-10-CM | POA: Diagnosis not present

## 2023-02-26 DIAGNOSIS — Z6841 Body Mass Index (BMI) 40.0 and over, adult: Secondary | ICD-10-CM

## 2023-02-26 DIAGNOSIS — L309 Dermatitis, unspecified: Secondary | ICD-10-CM

## 2023-02-26 DIAGNOSIS — Z1211 Encounter for screening for malignant neoplasm of colon: Secondary | ICD-10-CM

## 2023-02-26 DIAGNOSIS — F17210 Nicotine dependence, cigarettes, uncomplicated: Secondary | ICD-10-CM

## 2023-02-26 DIAGNOSIS — Z23 Encounter for immunization: Secondary | ICD-10-CM

## 2023-02-26 DIAGNOSIS — E782 Mixed hyperlipidemia: Secondary | ICD-10-CM

## 2023-02-26 NOTE — Progress Notes (Signed)
[facility]   Subjective:   Sharon Dyer is a 57 y.o. No obstetric history on file. female here for a routine annual gynecologic exam.  Current complaints: orthopedic problem.   Denies abnormal vaginal bleeding, discharge, pelvic pain, problems with intercourse or other gynecologic concerns.    Gynecologic History Patient's last menstrual period was 01/12/2015. Contraception: none  Health Maintenance  Topic Date Due   DTaP/Tdap/Td (1 - Tdap) Never done   Zoster Vaccines- Shingrix (2 of 2) 09/20/2022   Cervical Cancer Screening (HPV/Pap Cotest)  10/04/2022   INFLUENZA VACCINE  Never done   COVID-19 Vaccine (2 - 2023-24 season) 02/09/2023   MAMMOGRAM  11/20/2024   Fecal DNA (Cologuard)  02/23/2025   Hepatitis C Screening  Completed   HIV Screening  Completed   HPV VACCINES  Aged Out   Lung Cancer Screening  Discontinued    Obstetric History OB History  No obstetric history on file.    Past Medical History:  Diagnosis Date   Anxiety    Arthritis    Patient verbalizes she has athritis to right ankle   Dyspnea    Frozen shoulder syndrome 07/30/2019   Hyperlipidemia    Hypertension    Obesity    PAD (peripheral artery disease) (HCC)    Pre-diabetes     Past Surgical History:  Procedure Laterality Date   ABDOMINAL AORTOGRAM W/LOWER EXTREMITY Bilateral 08/16/2022   Procedure: ABDOMINAL AORTOGRAM W/LOWER EXTREMITY;  Surgeon: Yates Decamp, MD;  Location: [facility];  Service: Cardiovascular;  Laterality: Bilateral;   ABDOMINAL AORTOGRAM W/LOWER EXTREMITY N/A 09/06/2022   Procedure: ABDOMINAL AORTOGRAM W/LOWER EXTREMITY;  Surgeon: Yates Decamp, MD;  Location: [facility];  Service: Cardiovascular;  Laterality: N/A;   CHOLECYSTECTOMY N/A 09/30/2022   Procedure: LAPAROSCOPIC CHOLECYSTECTOMY;  Surgeon: Quentin Ore, MD;  Location: [facility];  Service: General;  Laterality: N/A;   ECTOPIC PREGNANCY SURGERY  X 2   PERIPHERAL VASCULAR ATHERECTOMY  Right 08/16/2022   Procedure: PERIPHERAL VASCULAR ATHERECTOMY;  Surgeon: Yates Decamp, MD;  Location: [facility] IN[facility]IVE CV LAB;  Service: Cardiovascular;  Laterality: Right;  R SFA   PERIPHERAL VASCULAR ATHERECTOMY  09/06/2022   Procedure: PERIPHERAL VASCULAR ATHERECTOMY;  Surgeon: Yates Decamp, MD;  Location: [facility] INVASIVE CV LAB;  Service: Cardiovascular;;   PERIPHERAL VASCULAR BALLOON ANGIOPLASTY  08/16/2022   Procedure: PERIPHERAL VASCULAR BALLOON ANGIOPLASTY;  Surgeon: Yates Decamp, MD;  Location: [facility];  Service: Cardiovascular;;   PERIPHERAL VASCULAR INTERVENTION  09/06/2022   Procedure: PERIPHERAL VASCULAR INTERVENTION;  Surgeon: Yates Decamp, MD;  Location: [facility];  Service: Cardiovascular;;    Current Outpatient Medications on File Prior to Visit  Medication Sig Dispense Refill   acetaminophen-codeine (TYLENOL #3) 300-30 MG tablet Take 1 tablet by mouth every 12 (twelve) hours as needed for moderate pain. 30 tablet 0   amLODipine (NORVASC) 10 MG tablet TAKE ONE TABLET BY MOUTH DAILY 90 tablet 1   aspirin (ASPIRIN CHILDRENS) 81 MG chewable tablet Chew 1 tablet (81 mg total) by mouth daily. 90 tablet 1   atorvastatin (LIPITOR) 20 MG tablet Take 1 tablet (20 mg total) by mouth daily. 90 tablet 1   Cholecalciferol (VITAMIN D) 125 MCG (5000 UT) CAPS Take 1 Capful by mouth daily. 90 capsule 3   diclofenac Sodium (VOLTAREN) 1 % GEL Apply 4 g topically 4 (four) times daily. 100 g 0   gabapentin (NEURONTIN) 300 MG capsule Take 1 capsule (300 mg total) by mouth  3 (three) times daily. 30 capsule 0   ibuprofen (ADVIL) 800 MG tablet Take 800 mg by mouth every 8 (eight) hours as needed for moderate pain or mild pain.     losartan-hydrochlorothiazide (HYZAAR) 100-25 MG tablet Take 1 tablet by mouth daily. 90 tablet 1   buPROPion (WELLBUTRIN SR) 150 MG 12 hr tablet TAKE 1 TABLET(150 MG) BY MOUTH TWICE DAILY IN THE MORNING AND LATE AFTERNOON (Patient not taking: Reported on 02/26/2023) 60 tablet 2    busPIRone (BUSPAR) 5 MG tablet Take 1 tablet (5 mg total) by mouth 2 (two) times daily. (Patient not taking: Reported on 02/26/2023) 180 tablet 1   nicotine (NICODERM CQ) 21 mg/24hr patch Place 1 patch (21 mg total) onto the skin daily. (Patient not taking: Reported on 02/26/2023) 28 patch 1   ondansetron (ZOFRAN-ODT) 8 MG disintegrating tablet Take 1 tablet (8 mg total) by mouth every 8 (eight) hours as needed. (Patient not taking: Reported on 12/17/2022) 8 tablet 0   predniSONE (STERAPRED UNI-PAK 21 TAB) 5 MG (21) TBPK tablet Take dosepak as directed (Patient not taking: Reported on 02/26/2023) 21 tablet 0   No current facility-administered medications on file prior to visit.    No Known Allergies  Social History   Socioeconomic History   Marital status: Divorced    Spouse name: Not on file   Number of children: 0   Years of education: Not on file   Highest education level: 12th grade  Occupational History   Not on file  Tobacco Use   Smoking status: Some Days    Current packs/day: 0.15    Average packs/day: 0.2 packs/day for 41.7 years (6.3 ttl pk-yrs)    Types: Cigarettes    Start date: 12   Smokeless tobacco: Never   Tobacco comments:    Started on Wellbutrin for depression  Vaping Use   Vaping status: Never Used  Substance and Sexual Activity   Alcohol use: Not Currently    Comment: 12/22/2017 "maybe twice a year"   Drug use: Never   Sexual activity: Yes    Birth control/protection: None  Other Topics Concern   Not on file  Social History Narrative   Not on file   Social Determinants of Health   Financial Resource Strain: High Risk (11/05/2022)   Overall Financial Resource Strain (CARDIA)    Difficulty of Paying Living Expenses: Hard  Food Insecurity: Food Insecurity Present (11/05/2022)   Hunger Vital Sign    Worried About Running Out of Food in the Last Year: Sometimes true    Ran Out of Food in the Last Year: Sometimes true  Transportation Needs: No  Transportation Needs (11/05/2022)   PRAPARE - Administrator, Civil Service (Medical): No    Lack of Transportation (Non-Medical): No  Physical Activity: Unknown (11/05/2022)   Exercise Vital Sign    Days of Exercise per Week: 0 days    Minutes of Exercise per Session: Not on file  Stress: Stress Concern Present (11/05/2022)   [employer] of Occupational Health - Occupational Stress Questionnaire    Feeling of Stress : To some extent  Social Connections: Unknown (11/05/2022)   Social Connection and Isolation Panel [NHANES]    Frequency of Communication with Friends and Family: Once a week    Frequency of Social Gatherings with Friends and Family: Patient declined    Attends Religious Services: Never    Database administrator or Organizations: No    Attends Engineer, structural:  Not on file    Marital Status: Living with partner  Intimate Partner Violence: Not At Risk (09/30/2022)   Humiliation, Afraid, Rape, and Kick questionnaire    Fear of Current or Ex-Partner: No    Emotionally Abused: No    Physically Abused: No    Sexually Abused: No    Family History  Problem Relation Age of Onset   Breast cancer Mother        in 64s   Hypertension Mother    Stroke Mother     The following portions of the patient's history were reviewed and updated as appropriate: allergies, current medications, past family history, past medical history, past social history, past surgical history and problem list.  Review of Systems Pertinent items noted in HPI and remainder of comprehensive ROS otherwise negative.   Objective:  BP 108/72 (BP Location: Right Arm, Patient Position: Sitting, Cuff Size: Large)   Pulse 66   Resp 16   Ht 5\' 8"  (1.727 m)   Wt 289 lb 9.6 oz (131.4 kg)   LMP 01/12/2015   BMI 44.03 kg/m  CONSTITUTIONAL: Well-developed, well-nourished female in no acute distress.  HENT:  Normocephalic, atraumatic, External right and left ear normal. Oropharynx is  clear and moist EYES: Conjunctivae and EOM are normal. Pupils are equal, round, and reactive to light. No scleral icterus.  NECK: Normal range of motion, supple, no masses.  Normal thyroid.  SKIN: Skin is warm and dry. No rash noted. Not diaphoretic. No erythema. No pallor. NEUROLGIC: Alert and oriented to person, place, and time. Normal reflexes, muscle tone coordination. No cranial nerve deficit noted. PSYCHIATRIC: Normal mood and affect. Normal behavior. Normal judgment and thought content. CARDIOVASCULAR: Normal heart rate noted, regular rhythm RESPIRATORY: Clear to auscultation bilaterally. Effort and breath sounds normal, no problems with respiration noted. BREASTS: Symmetric in size. No masses, skin changes, nipple drainage, or lymphadenopathy. Taught self breast exam and had patient to demonstrate SBE. ABDOMEN: Soft, normal bowel sounds, no distention noted.  No tenderness, rebound or guarding.  PELVIC: Normal appearing external genitalia; normal appearing vaginal mucosa and cervix.  No abnormal discharge noted.  Pap smear obtained.  Normal uterine size, no other palpable masses, no uterine or adnexal tenderness. MUSCULOSKELETAL: Normal range of motion. No tenderness.  No cyanosis, clubbing, or edema.  2+ distal pulses.   Assessment:  Annual gynecologic examination with pap smear  Sharon Dyer was seen today for annual exam.  Diagnoses and all orders for this visit:  Colon cancer screening -     Cervicovaginal ancillary only -     Cytology - PAP  Essential hypertension Welled controlled -     CMP14+EGFR  Mixed hyperlipidemia -     Lipid panel  Medication management -     CBC with Differential/Platelet  Eczema, unspecified type triamcinolone cream (KENALOG) 0.1 %  Other orders -     Varicella-zoster vaccine IM    Will follow up results of pap smear and manage accordingly. Mammogram scheduled Routine preventative health maintenance measures emphasized. Please refer to After  Visit Summary for other counseling recommendations.   This note has been created with Education officer, environmental. Any transcriptional errors are unintentional.   Grayce Sessions, NP 02/26/2023, 10:42 AM

## 2023-02-26 NOTE — Patient Instructions (Signed)
Zoster Vaccine Injection What is this medication? ZOSTER VACCINE (ZOS ter vak SEEN) reduces the risk of herpes zoster (shingles). It does not treat shingles. It is still possible to get shingles after receiving the vaccine, but the symptoms may be less severe or not last as long. It works by helping your immune system learn how to fight off a future infection. This medicine may be used for other purposes; ask your health care provider or pharmacist if you have questions. COMMON BRAND NAME(S): [facility] What should I tell my care team before I take this medication? They need to know if you have any of these conditions: Cancer Immune system problems An unusual or allergic reaction to Zoster vaccine, other medications, foods, dyes, or preservatives Pregnant or trying to get pregnant Breastfeeding How should I use this medication? This vaccine is injected into a muscle. It is given by your care team. This vaccine requires 2 doses to get the full benefit. Set a reminder for when your next dose is due. A copy of Vaccine Information Statements will be given before each vaccination. Be sure to read this information carefully each time. This sheet may change often. Talk to your care team about the use of this vaccine in children. This vaccine is not approved for use in children. Overdosage: If you think you have taken too much of this medicine contact a poison control center or emergency room at once. NOTE: This medicine is only for you. Do not share this medicine with others. What if I miss a dose? Keep appointments for follow-up (booster) doses. It is important not to miss your dose. Call your care team if you are unable to keep an appointment. What may interact with this medication? Medications that suppress your immune system Medications to treat cancer Steroid medications, such as prednisone or cortisone This list may not describe all possible interactions. Give your health care provider a list  of all the medicines, herbs, non-prescription drugs, or dietary supplements you use. Also tell them if you smoke, drink alcohol, or use illegal drugs. Some items may interact with your medicine. What should I watch for while using this medication? Visit your care team regularly. This vaccine, like all vaccines, may not fully protect everyone. What side effects may I notice from receiving this medication? Side effects that you should report to your care team as soon as possible: Allergic reactions--skin rash, itching, hives, swelling of the face, lips, tongue, or throat Side effects that usually do not require medical attention (report these to your care team if they continue or are bothersome): Chills Fatigue Feeling faint or lightheaded Fever Headache Muscle pain Pain, redness, or irritation at injection site This list may not describe all possible side effects. Call your doctor for medical advice about side effects. You may report side effects to FDA at 1-800-FDA-1088. Where should I keep my medication? This vaccine is only given by your care team. It will not be stored at home. NOTE: This sheet is a summary. It may not cover all possible information. If you have questions about this medicine, talk to your doctor, pharmacist, or health care provider.  2024 Elsevier/Gold Standard (2021-11-15 00:00:00)

## 2023-02-27 LAB — CBC WITH DIFFERENTIAL/PLATELET
Basophils Absolute: 0.1 10*3/uL (ref 0.0–0.2)
Basos: 1 %
EOS (ABSOLUTE): 0.4 10*3/uL (ref 0.0–0.4)
Eos: 4 %
Hematocrit: 41 % (ref 34.0–46.6)
Hemoglobin: 13.4 g/dL (ref 11.1–15.9)
Immature Grans (Abs): 0.1 10*3/uL (ref 0.0–0.1)
Immature Granulocytes: 1 %
Lymphocytes Absolute: 2.6 10*3/uL (ref 0.7–3.1)
Lymphs: 26 %
MCH: 29.6 pg (ref 26.6–33.0)
MCHC: 32.7 g/dL (ref 31.5–35.7)
MCV: 91 fL (ref 79–97)
Monocytes Absolute: 1 10*3/uL — ABNORMAL HIGH (ref 0.1–0.9)
Monocytes: 10 %
Neutrophils Absolute: 6.1 10*3/uL (ref 1.4–7.0)
Neutrophils: 58 %
Platelets: 204 10*3/uL (ref 150–450)
RBC: 4.53 x10E6/uL (ref 3.77–5.28)
RDW: 13.4 % (ref 11.7–15.4)
WBC: 10.2 10*3/uL (ref 3.4–10.8)

## 2023-02-27 LAB — CMP14+EGFR
ALT: 10 IU/L (ref 0–32)
AST: 14 IU/L (ref 0–40)
Albumin: 4.1 g/dL (ref 3.8–4.9)
Alkaline Phosphatase: 81 IU/L (ref 44–121)
BUN/Creatinine Ratio: 13 (ref 9–23)
BUN: 11 mg/dL (ref 6–24)
Bilirubin Total: 0.3 mg/dL (ref 0.0–1.2)
CO2: 24 mmol/L (ref 20–29)
Calcium: 9.2 mg/dL (ref 8.7–10.2)
Chloride: 104 mmol/L (ref 96–106)
Creatinine, Ser: 0.82 mg/dL (ref 0.57–1.00)
Globulin, Total: 2.9 g/dL (ref 1.5–4.5)
Glucose: 83 mg/dL (ref 70–99)
Potassium: 4.1 mmol/L (ref 3.5–5.2)
Sodium: 144 mmol/L (ref 134–144)
Total Protein: 7 g/dL (ref 6.0–8.5)
eGFR: 82 mL/min/{1.73_m2} (ref >59)

## 2023-02-27 LAB — LIPID PANEL
Chol/HDL Ratio: 3 ratio (ref 0.0–4.4)
Cholesterol, Total: 131 mg/dL (ref 100–199)
HDL: 44 mg/dL (ref >39)
LDL Chol Calc (NIH): 70 mg/dL (ref 0–99)
Triglycerides: 86 mg/dL (ref 0–149)
VLDL Cholesterol Cal: 17 mg/dL (ref 5–40)

## 2023-02-28 ENCOUNTER — Ambulatory Visit: Payer: Medicaid Other | Admitting: Surgical

## 2023-02-28 LAB — CERVICOVAGINAL ANCILLARY ONLY
Bacterial Vaginitis (gardnerella): NEGATIVE
Candida Glabrata: NEGATIVE
Candida Vaginitis: NEGATIVE
Chlamydia: NEGATIVE
Comment: NEGATIVE
Comment: NEGATIVE
Comment: NEGATIVE
Comment: NEGATIVE
Comment: NEGATIVE
Comment: NORMAL
Neisseria Gonorrhea: NEGATIVE
Trichomonas: POSITIVE — AB

## 2023-03-01 MED ORDER — TRIAMCINOLONE ACETONIDE 0.1 % EX CREA: 1.0000 | TOPICAL_CREAM | Freq: Two times a day (BID) | CUTANEOUS | 1 refills | Status: AC

## 2023-03-02 ENCOUNTER — Encounter (INDEPENDENT_AMBULATORY_CARE_PROVIDER_SITE_OTHER): Payer: Self-pay | Admitting: Primary Care

## 2023-03-03 ENCOUNTER — Other Ambulatory Visit: Payer: Self-pay | Admitting: Surgical

## 2023-03-03 MED ORDER — ACETAMINOPHEN-CODEINE 300-30 MG PO TABS: 1.0000 | ORAL_TABLET | Freq: Two times a day (BID) | ORAL | 0 refills | Status: DC | PRN

## 2023-03-03 MED ORDER — IBUPROFEN 800 MG PO TABS: 800.0000 mg | ORAL_TABLET | Freq: Three times a day (TID) | ORAL | 0 refills | Status: DC | PRN

## 2023-03-03 NOTE — Telephone Encounter (Signed)
Refilled med

## 2023-03-04 LAB — CYTOLOGY - PAP
Comment: NEGATIVE
Diagnosis: NEGATIVE
High risk HPV: NEGATIVE

## 2023-03-05 ENCOUNTER — Other Ambulatory Visit (INDEPENDENT_AMBULATORY_CARE_PROVIDER_SITE_OTHER): Payer: Self-pay | Admitting: Primary Care

## 2023-03-05 DIAGNOSIS — B75 Trichinellosis: Secondary | ICD-10-CM

## 2023-03-05 MED ORDER — METRONIDAZOLE 500 MG PO TABS
500.0000 mg | ORAL_TABLET | Freq: Two times a day (BID) | ORAL | 0 refills | Status: DC
Start: 2023-03-05 — End: 2023-03-26

## 2023-03-13 ENCOUNTER — Other Ambulatory Visit: Payer: Self-pay

## 2023-03-13 ENCOUNTER — Ambulatory Visit: Payer: Medicaid Other | Admitting: Physical Medicine and Rehabilitation

## 2023-03-13 VITALS — BP 114/67 | HR 81

## 2023-03-13 DIAGNOSIS — M5412 Radiculopathy, cervical region: Secondary | ICD-10-CM | POA: Diagnosis not present

## 2023-03-13 MED ORDER — METHYLPREDNISOLONE ACETATE 80 MG/ML IJ SUSP
80.0000 mg | Freq: Once | INTRAMUSCULAR | Status: AC
  Administered 2023-03-13: 80 mg

## 2023-03-13 NOTE — Progress Notes (Signed)
Functional Pain Scale - descriptive words and definitions  Distressing (6)    Pain is present/unable to complete most ADLs limited by pain/sleep is difficult and active distraction is only marginal. Moderate range order  Average Pain 6   +Driver, -BT, -Dye Allergies.  Neck pain on left side that radiates into the left arm to the hand. Tylenol #3 helps pain

## 2023-03-13 NOTE — Patient Instructions (Signed)

## 2023-03-13 NOTE — Progress Notes (Signed)
Sharon Dyer - 57 y.o. female MRN [id number]  Date of birth: 06-Nov-1965  Office Visit Note: Visit Date: 03/13/2023 PCP: Grayce Sessions, NP Referred by: Grayce Sessions, NP  Subjective: Chief Complaint  Patient presents with   Neck - Pain   HPI:  Sharon Dyer is a 57 y.o. female who comes in today at the request of Dr. Burnard Bunting for planned Left C7-T1 Cervical Interlaminar epidural steroid injection with fluoroscopic guidance.  The patient has failed conservative care including home exercise, medications, time and activity modification.  This injection will be diagnostic and hopefully therapeutic.  Please see requesting physician notes for further details and justification.   ROS Otherwise per HPI.  Assessment & Plan: Visit Diagnoses:    ICD-10-CM   1. Cervical radiculopathy  M54.12 XR C-ARM NO REPORT    Epidural Steroid injection    methylPREDNISolone acetate (DEPO-MEDROL) injection 80 mg      Plan: No additional findings.   Meds & Orders:  Meds ordered this encounter  Medications   methylPREDNISolone acetate (DEPO-MEDROL) injection 80 mg    Orders Placed This Encounter  Procedures   XR C-ARM NO REPORT   Epidural Steroid injection    Follow-up: Return for visit to requesting provider as needed.   Procedures: No procedures performed  Cervical Epidural Steroid Injection - Interlaminar Approach with Fluoroscopic Guidance  Patient: Sharon Dyer      Date of Birth: 19-Jul-1965 MRN: [id number] PCP: Grayce Sessions, NP      Visit Date: 03/13/2023   Universal Protocol:    Date/Time: 03/12/2410:03 AM  Consent Given By: the patient  Position: PRONE  Additional Comments: Vital signs were monitored before and after the procedure. Patient was prepped and draped in the usual sterile fashion. The correct patient, procedure, and site was verified.   Injection Procedure Details:   Procedure diagnoses: Cervical radiculopathy [M54.12]    Meds  Administered:  Meds ordered this encounter  Medications   methylPREDNISolone acetate (DEPO-MEDROL) injection 80 mg     Laterality: Left  Location/Site: C7-T1  Needle: 4.5 in., 20 ga. Tuohy  Needle Placement: Paramedian epidural space  Findings:  -Comments: Excellent flow of contrast into the epidural space.  Procedure Details: Using a paramedian approach from the side mentioned above, the region overlying the inferior lamina was localized under fluoroscopic visualization and the soft tissues overlying this structure were infiltrated with 4 ml. of 1% Lidocaine without Epinephrine. A # 20 gauge, Tuohy needle was inserted into the epidural space using a paramedian approach.  The epidural space was localized using loss of resistance along with contralateral oblique bi-planar fluoroscopic views.  After negative aspirate for air, blood, and CSF, a 2 ml. volume of Isovue-250 was injected into the epidural space and the flow of contrast was observed. Radiographs were obtained for documentation purposes.   The injectate was administered into the level noted above.  Additional Comments:  No complications occurred Dressing: 2 x 2 sterile gauze and Band-Aid    Post-procedure details: Patient was observed during the procedure. Post-procedure instructions were reviewed.  Patient left the clinic in stable condition.   Clinical History: No specialty comments available.     Objective:  VS:  HT:    WT:   BMI:     BP:114/67  HR:81bpm  TEMP: ( )  RESP:  Physical Exam Vitals and nursing note reviewed.  Constitutional:      General: She is not in acute distress.  Appearance: Normal appearance. She is not ill-appearing.  HENT:     Head: Normocephalic and atraumatic.     Right Ear: External ear normal.     Left Ear: External ear normal.  Eyes:     Extraocular Movements: Extraocular movements intact.  Cardiovascular:     Rate and Rhythm: Normal rate.     Pulses: Normal pulses.   Musculoskeletal:     Cervical back: Tenderness present. No rigidity.     Right lower leg: No edema.     Left lower leg: No edema.     Comments: Patient has good strength in the upper extremities including 5 out of 5 strength in wrist extension long finger flexion and APB.  There is no atrophy of the hands intrinsically.  There is a negative Hoffmann's test.   Lymphadenopathy:     Cervical: No cervical adenopathy.  Skin:    Findings: No erythema, lesion or rash.  Neurological:     General: No focal deficit present.     Mental Status: She is alert and oriented to person, place, and time.     Sensory: No sensory deficit.     Motor: No weakness or abnormal muscle tone.     Coordination: Coordination normal.  Psychiatric:        Mood and Affect: Mood normal.        Behavior: Behavior normal.      Imaging: XR C-ARM NO REPORT  Result Date: 03/13/2023 Please see Notes tab for imaging impression.

## 2023-03-13 NOTE — Procedures (Signed)
Cervical Epidural Steroid Injection - Interlaminar Approach with Fluoroscopic Guidance  Patient: Sharon Dyer      Date of Birth: 1966/02/18 MRN: [id number] PCP: Grayce Sessions, NP      Visit Date: 03/13/2023   Universal Protocol:    Date/Time: 03/12/2410:03 AM  Consent Given By: the patient  Position: PRONE  Additional Comments: Vital signs were monitored before and after the procedure. Patient was prepped and draped in the usual sterile fashion. The correct patient, procedure, and site was verified.   Injection Procedure Details:   Procedure diagnoses: Cervical radiculopathy [M54.12]    Meds Administered:  Meds ordered this encounter  Medications   methylPREDNISolone acetate (DEPO-MEDROL) injection 80 mg     Laterality: Left  Location/Site: C7-T1  Needle: 4.5 in., 20 ga. Tuohy  Needle Placement: Paramedian epidural space  Findings:  -Comments: Excellent flow of contrast into the epidural space.  Procedure Details: Using a paramedian approach from the side mentioned above, the region overlying the inferior lamina was localized under fluoroscopic visualization and the soft tissues overlying this structure were infiltrated with 4 ml. of 1% Lidocaine without Epinephrine. A # 20 gauge, Tuohy needle was inserted into the epidural space using a paramedian approach.  The epidural space was localized using loss of resistance along with contralateral oblique bi-planar fluoroscopic views.  After negative aspirate for air, blood, and CSF, a 2 ml. volume of Isovue-250 was injected into the epidural space and the flow of contrast was observed. Radiographs were obtained for documentation purposes.   The injectate was administered into the level noted above.  Additional Comments:  No complications occurred Dressing: 2 x 2 sterile gauze and Band-Aid    Post-procedure details: Patient was observed during the procedure. Post-procedure instructions were reviewed.  Patient  left the clinic in stable condition.

## 2023-03-26 ENCOUNTER — Ambulatory Visit (INDEPENDENT_AMBULATORY_CARE_PROVIDER_SITE_OTHER): Payer: Medicaid Other | Admitting: Primary Care

## 2023-03-26 ENCOUNTER — Encounter (INDEPENDENT_AMBULATORY_CARE_PROVIDER_SITE_OTHER): Payer: Self-pay | Admitting: Primary Care

## 2023-03-26 VITALS — BP 133/83 | HR 71 | Resp 16 | Wt 291.6 lb

## 2023-03-26 DIAGNOSIS — R202 Paresthesia of skin: Secondary | ICD-10-CM | POA: Diagnosis not present

## 2023-03-26 DIAGNOSIS — Z23 Encounter for immunization: Secondary | ICD-10-CM | POA: Diagnosis not present

## 2023-03-26 DIAGNOSIS — R2233 Localized swelling, mass and lump, upper limb, bilateral: Secondary | ICD-10-CM

## 2023-03-26 DIAGNOSIS — B75 Trichinellosis: Secondary | ICD-10-CM | POA: Diagnosis not present

## 2023-03-26 NOTE — Patient Instructions (Addendum)
Trichomoniasis Trichomoniasis is a sexually transmitted infection (STI). Many people with trichomoniasis do not have any symptoms (are asymptomatic) or have only minimal symptoms. Untreated trichomoniasis can last from months to years. This condition is treated with medicine. What are the causes? This condition is caused by a parasite called Trichomonas vaginalis and is transmitted during sexual contact. What increases the risk? The following factors may make you more likely to develop this condition: Having unprotected sex. Having sex with a partner who has trichomoniasis. Having multiple sexual partners. Having had previous trichomoniasis infections or other STIs. What are the signs or symptoms? In females, symptoms of trichomoniasis include: Itching, burning, redness, or soreness in the genital area. Discomfort while urinating. Abnormal vaginal discharge that is clear, white, gray, or yellow-green and foamy and has an unusual fishy odor. In males, symptoms of trichomoniasis include: Discharge from the penis. Burning after urination or ejaculation. Itching or discomfort inside the penis. How is this diagnosed? This condition is diagnosed based on tests. To perform a test, your health care provider will do one of the following: Ask you to provide a urine sample. Take a sample of discharge. The sample may be taken from the vagina or cervix in females and from the urethra in males. Your health care provider may use a swab to collect the sample. Your health care provider may test you for other STIs, including human immunodeficiency virus (HIV). How is this treated?  This condition is treated with medicines such as metronidazole or tinidazole. These are called antimicrobial medicines, and they are taken by mouth (orally). Your sexual partner or partners also need to be tested and treated. If they have the infection and are not treated, you will likely get reinfected. If you plan to become  pregnant or think you may be pregnant, tell your health care provider right away. Some medicines that are used to treat the infection should not be taken during pregnancy. Your health care provider may recommend over-the-counter medicines or creams to help relieve itching or irritation. You may be tested for the infection again 3 months after treatment. Follow these instructions at home: Medicines Take over-the-counter and prescription medicines only as told by your health care provider. Take your antimicrobial medicine as told by your health care provider. Do not stop taking it even if you start to feel better. Use creams as told by your health care provider. General instructions Do not have sex until after you finish your medicine and your symptoms have resolved. Do not wear tampons while you have the infection (if you are female). Talk with your sexual partner or partners about any symptoms that either of you may have, as well as any history of STIs. Keep all follow-up visits. This is important. How is this prevented?  Use condoms every time you have sex. Using condoms correctly and consistently can help protect against STIs. Do not have sexual contact if you have symptoms of trichomoniasis or another STI. Avoid having multiple sexual partners. Get tested for STIs before you have sex with a partner. Ask all partners to do the same. Do not douche (if you are female). Douching may increase your risk for getting STIs due to the removal of good bacteria in the vagina. Contact a health care provider if: You still have symptoms after you finish your medicine. You develop a rash. You plan to become pregnant or think you may be pregnant. Summary Trichomoniasis is a sexually transmitted infection (STI). This condition often has no symptoms or minimal  symptoms. Take your antimicrobial medicine as told by your health care provider. Do not stop taking even if you start to feel better. Discuss your  infection with your sexual partner or partners. Make sure that all partners get tested and treated, if necessary. You should not have sex until after you finish your medicine and your symptoms have resolved. Keep all follow-up visits. This is important. This information is not intended to replace advice given to you by your health care provider. Make sure you discuss any questions you have with your health care provider. Document Revised: 04/25/2021 Document Reviewed: 04/25/2021 Elsevier Patient Education  2024 Elsevier Inc. Td (Tetanus, Diphtheria) Vaccine: What You Need to Know Many vaccine information statements are available in Spanish and other languages. See PromoAge.com.br. 1. Why get vaccinated? Td vaccine can prevent tetanus and diphtheria. Tetanus enters the body through cuts or wounds. Diphtheria spreads from person to person. TETANUS (T) causes painful stiffening of the muscles. Tetanus can lead to serious health problems, including being unable to open the mouth, having trouble swallowing and breathing, or death. DIPHTHERIA (D) can lead to difficulty breathing, heart failure, paralysis, or death. 2. Td vaccine Td is only for children 7 years and older, adolescents, and adults.  Td is usually given as a booster dose every 10 years, or after 5 years in the case of a severe or dirty wound or burn. Another vaccine, called "Tdap," may be used instead of Td. Tdap protects against pertussis, also known as "whooping cough," in addition to tetanus and diphtheria. Td may be given at the same time as other vaccines. 3. Talk with your health care provider Tell your vaccination provider if the person getting the vaccine: Has had an allergic reaction after a previous dose of any vaccine that protects against tetanus or diphtheria, or has any severe, life-threatening allergies Has ever had Guillain-Barr Syndrome (also called "GBS") Has had severe pain or swelling after a previous dose of  any vaccine that protects against tetanus or diphtheria In some cases, your health care provider may decide to postpone Td vaccination until a future visit. People with minor illnesses, such as a cold, may be vaccinated. People who are moderately or severely ill should usually wait until they recover before getting Td vaccine.  Your health care provider can give you more information. 4. Risks of a vaccine reaction Pain, redness, or swelling where the shot was given, mild fever, headache, feeling tired, and nausea, vomiting, diarrhea, or stomachache sometimes happen after Td vaccination. People sometimes faint after medical procedures, including vaccination. Tell your provider if you feel dizzy or have vision changes or ringing in the ears.  As with any medicine, there is a very remote chance of a vaccine causing a severe allergic reaction, other serious injury, or death. 5. What if there is a serious problem? An allergic reaction could occur after the vaccinated person leaves the clinic. If you see signs of a severe allergic reaction (hives, swelling of the face and throat, difficulty breathing, a fast heartbeat, dizziness, or weakness), call 9-1-1 and get the person to the nearest hospital.  For other signs that concern you, call your health care provider.  Adverse reactions should be reported to the Vaccine Adverse Event Reporting System (VAERS). Your health care provider will usually file this report, or you can do it yourself. Visit the VAERS website at www.vaers.LAgents.no or call 978-819-0960. VAERS is only for reporting reactions, and VAERS staff members do not give medical advice. 6. The [employer]  Vaccine Injury Compensation Program The National Vaccine Injury Compensation Program (VICP) is a federal program that was created to compensate people who may have been injured by certain vaccines. Claims regarding alleged injury or death due to vaccination have a time limit for filing, which may be as  short as two years. Visit the VICP website at SpiritualWord.at or call 979-530-3850 to learn about the program and about filing a claim. 7. How can I learn more? Ask your health care provider. Call your local or state health department. Visit the website of the Food and Drug Administration (FDA) for vaccine package inserts and additional information at FinderList.no. Contact the Centers for Disease Control and Prevention (CDC): Call 8065446819 (1-800-CDC-INFO) or Visit CDC's website at PicCapture.uy. Source: CDC Vaccine Information Statement Td (Tetanus, Diphtheria) Vaccine (01/14/2020) This same material is available at FootballExhibition.com.br for no charge. This information is not intended to replace advice given to you by your health care provider. Make sure you discuss any questions you have with your health care provider. Document Revised: 09/11/2022 Document Reviewed: 07/12/2022 Elsevier Patient Education  2024 Elsevier Inc.   Influenza Vaccine Injection What is this medication? INFLUENZA VACCINE (in floo EN zuh vak SEEN) reduces the risk of the influenza (flu). It does not treat influenza. It is still possible to get influenza after receiving this vaccine, but the symptoms may be less severe or not last as long. It works by helping your immune system learn how to fight off a future infection. This medicine may be used for other purposes; ask your health care provider or pharmacist if you have questions. COMMON BRAND NAME(S): Afluria Quadrivalent, FLUAD Quadrivalent, Fluarix Quadrivalent, Flublok Quadrivalent, FLUCELVAX Quadrivalent, Flulaval Quadrivalent, Fluzone Quadrivalent What should I tell my care team before I take this medication? They need to know if you have any of these conditions: Bleeding disorder like hemophilia Fever or infection Guillain-Barre syndrome or other neurological problems Immune system problems Infection with  the human immunodeficiency virus (HIV) or AIDS Low blood platelet counts Multiple sclerosis An unusual or allergic reaction to influenza virus vaccine, latex, other medications, foods, dyes, or preservatives. Different brands of vaccines contain different allergens. Some may contain latex or eggs. Talk to your care team about your allergies to make sure that you get the right vaccine. Pregnant or trying to get pregnant Breastfeeding How should I use this medication? This vaccine is injected into a muscle or under the skin. It is given by your care team. A copy of Vaccine Information Statements will be given before each vaccination. Be sure to read this sheet carefully each time. This sheet may change often. Talk to your care team to see which vaccines are right for you. Some vaccines should not be used in all age groups. Overdosage: If you think you have taken too much of this medicine contact a poison control center or emergency room at once. NOTE: This medicine is only for you. Do not share this medicine with others. What if I miss a dose? This does not apply. What may interact with this medication? Certain medications that lower your immune system, such as etanercept, anakinra, infliximab, adalimumab Certain medications that prevent or treat blood clots, such as warfarin Chemotherapy or radiation therapy Phenytoin Steroid medications, such as prednisone or cortisone Theophylline Vaccines This list may not describe all possible interactions. Give your health care provider a list of all the medicines, herbs, non-prescription drugs, or dietary supplements you use. Also tell them if you smoke, drink alcohol, or use  illegal drugs. Some items may interact with your medicine. What should I watch for while using this medication? Report any side effects that do not go away with your care team. Call your care team if any unusual symptoms occur within 6 weeks of receiving this vaccine. You may still  catch the flu, but the illness is not usually as bad. You cannot get the flu from the vaccine. The vaccine will not protect against colds or other illnesses that may cause fever. The vaccine is needed every year. What side effects may I notice from receiving this medication? Side effects that you should report to your care team as soon as possible: Allergic reactions--skin rash, itching, hives, swelling of the face, lips, tongue, or throat Side effects that usually do not require medical attention (report these to your care team if they continue or are bothersome): Chills Fatigue Headache Joint pain Loss of appetite Muscle pain Nausea Pain, redness, or irritation at injection site This list may not describe all possible side effects. Call your doctor for medical advice about side effects. You may report side effects to FDA at 1-800-FDA-1088. Where should I keep my medication? The vaccine is only given by your care team. It will not be stored at home. NOTE: This sheet is a summary. It may not cover all possible information. If you have questions about this medicine, talk to your doctor, pharmacist, or health care provider.  2024 Elsevier/Gold Standard (2021-11-06 00:00:00)

## 2023-03-26 NOTE — Progress Notes (Signed)
[facility]  Sharon Dyer, is a 57 y.o. female  EXB:[id number]  [id number]:[id number]  DOB - 18-Jul-1965  Chief Complaint  Patient presents with   Foot Pain    B/l       Subjective:   Sharon Dyer is a 57 y.o. female here today for an acute visit. She is c/o bilateral feet pain. Patient admits not taking her gabapentin as prescribe TID not daily  She also has 1 define bruise with a knot re left arm and on right arm knott unknown etiology . Patient has No headache, No chest pain, No abdominal pain - No Nausea, No new weakness tingling or numbness, No Cough - shortness of breath. Patient informs writer that trichomonas she got from the toilet seat or swimming pool- answer ok. I took the medication asked are you still with the same person yes was he treated no so asked what is a STD??? Something you get from sex so if you are treated and partner not what happens eyes popped wide open,  No problems updated.  No Known Allergies  Past Medical History:  Diagnosis Date   Anxiety    Arthritis    Patient verbalizes she has athritis to right ankle   Dyspnea    Frozen shoulder syndrome 07/30/2019   Hyperlipidemia    Hypertension    Obesity    PAD (peripheral artery disease) (HCC)    Pre-diabetes     Current Outpatient Medications on File Prior to Visit  Medication Sig Dispense Refill   acetaminophen-codeine (TYLENOL #3) 300-30 MG tablet Take 1 tablet by mouth every 12 (twelve) hours as needed for moderate pain. 30 tablet 0   amLODipine (NORVASC) 10 MG tablet TAKE ONE TABLET BY MOUTH DAILY 90 tablet 1   aspirin (ASPIRIN CHILDRENS) 81 MG chewable tablet Chew 1 tablet (81 mg total) by mouth daily. 90 tablet 1   atorvastatin (LIPITOR) 20 MG tablet Take 1 tablet (20 mg total) by mouth daily. 90 tablet 1   Cholecalciferol (VITAMIN D) 125 MCG (5000 UT) CAPS Take 1 Capful by mouth daily. 90 capsule 3   gabapentin (NEURONTIN) 300 MG capsule Take 1 capsule (300 mg total) by mouth 3  (three) times daily. 30 capsule 0   ibuprofen (ADVIL) 800 MG tablet Take 1 tablet (800 mg total) by mouth every 8 (eight) hours as needed for moderate pain or mild pain. 30 tablet 0   losartan-hydrochlorothiazide (HYZAAR) 100-25 MG tablet Take 1 tablet by mouth daily. 90 tablet 1   triamcinolone cream (KENALOG) 0.1 % Apply 1 Application topically 2 (two) times daily. 80 g 1   No current facility-administered medications on file prior to visit.    Objective:   Vitals:   03/26/23 0900  BP: 133/83  Pulse: 71  Resp: 16  SpO2: 98%  Weight: 291 lb 9.6 oz (132.3 kg)    Comprehensive ROS Pertinent positive and negative noted in HPI   Exam General appearance : Awake, alert, not in any distress. Speech Clear. Not toxic looking HEENT: Atraumatic and Normocephalic, pupils equally reactive to light and accomodation Neck: Supple, no JVD. No cervical lymphadenopathy.  Chest: Good air entry bilaterally, scattered wheezes CVS: S1 S2 regular, no murmurs.  Abdomen: Bowel sounds present, Non tender and not distended with no gaurding, rigidity or rebound. Extremities: B/L Lower Ext shows no edema, both legs are warm to touch Neurology: Awake alert, and oriented X 3, CN II-XII intact, Non focal Skin: No Rash  Data Review Lab Results  Component Value Date   HGBA1C 5.3 09/25/2022   HGBA1C 5.9 (A) 02/05/2022    Assessment & Plan  Sharon Dyer was seen today for foot pain.  Diagnoses and all orders for this visit:  Encounter for immunization -     Flu vaccine trivalent PF, 6mos and older(Flulaval,Afluria,Fluarix,Fluzone) -     Tdap vaccine greater than or equal to 7yo IM  Mass of arm, bilateral    Hard knott to palpitation no pain unknown etiology no s/s of infection   Paresthesia Take gabapentin as prescribed   Trichinosis AVS print out -       Patient have been counseled extensively about nutrition and exercise. Other issues discussed during this visit include: low cholesterol diet,  weight control and daily exercise, foot care, annual eye examinations at Ophthalmology, importance of adherence with medications and regular follow-up. We also discussed long term complications of uncontrolled diabetes and hypertension.   ASAP  The patient was given clear instructions to go to ER or return to medical center if symptoms don't improve, worsen or new problems develop. The patient verbalized understanding. The patient was told to call to get lab results if they haven't heard anything in the next week.   This note has been created with Education officer, environmental. Any transcriptional errors are unintentional.   Grayce Sessions, NP 03/26/2023, 9:44 AM

## 2023-03-31 ENCOUNTER — Ambulatory Visit (INDEPENDENT_AMBULATORY_CARE_PROVIDER_SITE_OTHER): Payer: Medicaid Other

## 2023-05-28 ENCOUNTER — Ambulatory Visit (INDEPENDENT_AMBULATORY_CARE_PROVIDER_SITE_OTHER): Payer: Medicaid Other | Admitting: Primary Care

## 2023-05-28 ENCOUNTER — Other Ambulatory Visit: Payer: Self-pay | Admitting: Medical Genetics

## 2023-05-29 ENCOUNTER — Other Ambulatory Visit: Payer: Self-pay

## 2023-05-29 ENCOUNTER — Emergency Department (HOSPITAL_COMMUNITY)
Admission: EM | Admit: 2023-05-29 | Discharge: 2023-05-29 | Disposition: A | Discharge status: Home / Self Care | Payer: Medicaid Other | Attending: Emergency Medicine | Admitting: Emergency Medicine

## 2023-05-29 ENCOUNTER — Encounter (HOSPITAL_COMMUNITY): Payer: Self-pay

## 2023-05-29 ENCOUNTER — Emergency Department (HOSPITAL_COMMUNITY): Payer: Medicaid Other

## 2023-05-29 DIAGNOSIS — I7143 Infrarenal abdominal aortic aneurysm, without rupture: Secondary | ICD-10-CM | POA: Diagnosis not present

## 2023-05-29 DIAGNOSIS — Z79899 Other long term (current) drug therapy: Secondary | ICD-10-CM | POA: Insufficient documentation

## 2023-05-29 DIAGNOSIS — Z7982 Long term (current) use of aspirin: Secondary | ICD-10-CM | POA: Insufficient documentation

## 2023-05-29 DIAGNOSIS — I10 Essential (primary) hypertension: Secondary | ICD-10-CM | POA: Insufficient documentation

## 2023-05-29 DIAGNOSIS — R1013 Epigastric pain: Secondary | ICD-10-CM | POA: Diagnosis present

## 2023-05-29 DIAGNOSIS — R1084 Generalized abdominal pain: Secondary | ICD-10-CM

## 2023-05-29 LAB — URINALYSIS, ROUTINE W REFLEX MICROSCOPIC
Bilirubin Urine: NEGATIVE
Glucose, UA: NEGATIVE mg/dL
Hgb urine dipstick: NEGATIVE
Ketones, ur: NEGATIVE mg/dL
Nitrite: NEGATIVE
Protein, ur: NEGATIVE mg/dL
Specific Gravity, Urine: 1.024 (ref 1.005–1.030)
pH: 5 (ref 5.0–8.0)

## 2023-05-29 LAB — CBC
HCT: 42.3 % (ref 36.0–46.0)
Hemoglobin: 14.4 g/dL (ref 12.0–15.0)
MCH: 31.6 pg (ref 26.0–34.0)
MCHC: 34 g/dL (ref 30.0–36.0)
MCV: 93 fL (ref 80.0–100.0)
Platelets: 190 10*3/uL (ref 150–400)
RBC: 4.55 MIL/uL (ref 3.87–5.11)
RDW: 13.4 % (ref 11.5–15.5)
WBC: 11.4 10*3/uL — ABNORMAL HIGH (ref 4.0–10.5)
nRBC: 0 % (ref 0.0–0.2)

## 2023-05-29 LAB — COMPREHENSIVE METABOLIC PANEL
ALT: 19 U/L (ref 0–44)
AST: 28 U/L (ref 15–41)
Albumin: 3.7 g/dL (ref 3.5–5.0)
Alkaline Phosphatase: 64 U/L (ref 38–126)
Anion gap: 11 (ref 5–15)
BUN: 18 mg/dL (ref 6–20)
CO2: 25 mmol/L (ref 22–32)
Calcium: 9 mg/dL (ref 8.9–10.3)
Chloride: 103 mmol/L (ref 98–111)
Creatinine, Ser: 0.69 mg/dL (ref 0.44–1.00)
GFR, Estimated: >60 mL/min (ref >60)
Glucose, Bld: 96 mg/dL (ref 70–99)
Potassium: 3.3 mmol/L — ABNORMAL LOW (ref 3.5–5.1)
Sodium: 139 mmol/L (ref 135–145)
Total Bilirubin: 0.6 mg/dL (ref <1.2)
Total Protein: 7.4 g/dL (ref 6.5–8.1)

## 2023-05-29 LAB — LIPASE, BLOOD: Lipase: 32 U/L (ref 11–51)

## 2023-05-29 LAB — TROPONIN I (HIGH SENSITIVITY)
Troponin I (High Sensitivity): 5 ng/L (ref <18)
Troponin I (High Sensitivity): 6 ng/L (ref <18)

## 2023-05-29 MED ORDER — IOHEXOL 300 MG/ML  SOLN
100.0000 mL | Freq: Once | INTRAMUSCULAR | Status: AC | PRN
  Administered 2023-05-29: 100 mL via INTRAVENOUS

## 2023-05-29 NOTE — ED Triage Notes (Signed)
Patient BIB [facility] from home. Has upper left abdominal pain since this morning. Began taking tylenol with codeine today and then her stomach began hurting after. Has been vomiting.

## 2023-05-29 NOTE — ED Provider Notes (Signed)
[place] EMERGENCY DEPARTMENT AT [facility] Provider Note   CSN: [id number] Arrival date & time: 05/29/23  0944     History  Chief Complaint  Patient presents with   Abdominal Pain    Sharon Dyer is a 57 y.o. female.  The history is provided by the patient and medical records. No language interpreter was used.  Abdominal Pain    She is a 39 female significant history of obesity, PAD, prediabetes, hypertension, hyperlipidemia brought here via EMS from home with complaint of abdominal pain.  Patient report this morning she took her usual morning medication and she also took some pain medication that has Tylenol and codeine in it.  Afterward she noticed pain about her abdomen.  She described pain as a sharp uncomfortable sensation to her epigastric region that was persistent.  She felt nauseous she tried to vomit but pain did not resolved.  However while waiting in the ER pain is completely subsided.  There is no associated fever or chills no chest pain or shortness of breath no productive cough no urinary discomfort.  She did not eat anything this morning.  She denies having heartburn.  Home Medications Prior to Admission medications   Medication Sig Start Date End Date Taking? Authorizing Provider  acetaminophen-codeine (TYLENOL #3) 300-30 MG tablet Take 1 tablet by mouth every 12 (twelve) hours as needed for moderate pain. 03/03/23   Magnant, Charles L, PA-C  amLODipine (NORVASC) 10 MG tablet TAKE ONE TABLET BY MOUTH DAILY 12/04/22   Grayce Sessions, NP  aspirin (ASPIRIN CHILDRENS) 81 MG chewable tablet Chew 1 tablet (81 mg total) by mouth daily. 08/06/22   Yates Decamp, MD  atorvastatin (LIPITOR) 20 MG tablet Take 1 tablet (20 mg total) by mouth daily. 12/04/22   Grayce Sessions, NP  Cholecalciferol (VITAMIN D) 125 MCG (5000 UT) CAPS Take 1 Capful by mouth daily. 12/17/22   Yates Decamp, MD  gabapentin (NEURONTIN) 300 MG capsule Take 1 capsule (300 mg total) by mouth 3  (three) times daily. 02/02/23 02/02/24  Magnant, Charles L, PA-C  ibuprofen (ADVIL) 800 MG tablet Take 1 tablet (800 mg total) by mouth every 8 (eight) hours as needed for moderate pain or mild pain. 03/03/23   Magnant, Charles L, PA-C  losartan-hydrochlorothiazide (HYZAAR) 100-25 MG tablet Take 1 tablet by mouth daily. 11/06/22   Grayce Sessions, NP  triamcinolone cream (KENALOG) 0.1 % Apply 1 Application topically 2 (two) times daily. 03/01/23   Grayce Sessions, NP      Allergies    Patient has no known allergies.    Review of Systems   Review of Systems  Gastrointestinal:  Positive for abdominal pain.  All other systems reviewed and are negative.   Physical Exam Updated Vital Signs BP 122/68 (BP Location: Right Wrist)   Pulse 64   Temp (!) 97.5 F (36.4 C) (Oral)   Resp 17   Ht 5\' 8"  (1.727 m)   Wt 136.1 kg   LMP 01/12/2015   SpO2 98%   BMI 45.61 kg/m  Physical Exam Vitals and nursing note reviewed.  Constitutional:      General: She is not in acute distress.    Appearance: She is well-developed.  HENT:     Head: Atraumatic.  Eyes:     Conjunctiva/sclera: Conjunctivae normal.  Cardiovascular:     Rate and Rhythm: Normal rate and regular rhythm.  Pulmonary:     Effort: Pulmonary effort is normal.  Abdominal:  General: Abdomen is flat.     Palpations: Abdomen is soft.     Tenderness: There is no abdominal tenderness.     Hernia: No hernia is present.  Musculoskeletal:     Cervical back: Neck supple.  Skin:    Findings: No rash.  Neurological:     Mental Status: She is alert.  Psychiatric:        Mood and Affect: Mood normal.     ED Results / Procedures / Treatments   Labs (all labs ordered are listed, but only abnormal results are displayed) Labs Reviewed  COMPREHENSIVE METABOLIC PANEL - Abnormal; Notable for the following components:      Result Value   Potassium 3.3 (*)    All other components within normal limits  CBC - Abnormal; Notable for  the following components:   WBC 11.4 (*)    All other components within normal limits  URINALYSIS, ROUTINE W REFLEX MICROSCOPIC - Abnormal; Notable for the following components:   Color, Urine AMBER (*)    APPearance HAZY (*)    Leukocytes,Ua TRACE (*)    Bacteria, UA RARE (*)    All other components within normal limits  LIPASE, BLOOD  TROPONIN I (HIGH SENSITIVITY)  TROPONIN I (HIGH SENSITIVITY)    EKG None  Radiology CT ABDOMEN PELVIS W CONTRAST Result Date: 05/29/2023 CLINICAL DATA:  Nonlocalized abdomen pain EXAM: CT ABDOMEN AND PELVIS WITH CONTRAST TECHNIQUE: Multidetector CT imaging of the abdomen and pelvis was performed using the standard protocol following bolus administration of intravenous contrast. RADIATION DOSE REDUCTION: This exam was performed according to the departmental dose-optimization program which includes automated exposure control, adjustment of the mA and/or kV according to patient size and/or use of iterative reconstruction technique. CONTRAST:  OMNIPAQUE IOHEXOL 300 MG/ML  SOLN COMPARISON:  CT 03/27/2015 FINDINGS: Lower chest: Lung bases demonstrate no acute airspace disease. Hepatobiliary: No focal liver abnormality is seen. Status post cholecystectomy. No biliary dilatation. Pancreas: Unremarkable. No pancreatic ductal dilatation or surrounding inflammatory changes. Spleen: Normal in size without focal abnormality. Adrenals/Urinary Tract: Adrenal glands are unremarkable. Kidneys are normal, without renal calculi, focal lesion, or hydronephrosis. Bladder is unremarkable. Stomach/Bowel: Stomach is within normal limits. Appendix appears normal. No evidence of bowel wall thickening, distention, or inflammatory changes. Vascular/Lymphatic: Advanced aortic atherosclerosis. Aneurysmal dilatation of the infrarenal abdominal aorta measuring up to 3.4 cm. No suspicious lymph nodes. Reproductive: Enhancing uterine mass at the fundus measuring 3.3 cm probably a fibroid. No  adnexal mass. Other: Negative for pelvic effusion or free air. Musculoskeletal: No acute or suspicious osseous abnormality. IMPRESSION: 1. No CT evidence for acute intra-abdominal or pelvic abnormality. 2. 3.4 cm infrarenal abdominal aortic aneurysm. Recommend follow-up ultrasound every 3 years. (Ref.: J Vasc Surg. 2018; 67:2-77 and J Am Coll Radiol 2013;10(10):789-794.) 3. Uterine fibroid. 4. Aortic atherosclerosis. Aortic Atherosclerosis (ICD10-I70.0). Electronically Signed   By: Jasmine Pang M.D.   On: 05/29/2023 17:59   DG Chest 1 View Result Date: 05/29/2023 CLINICAL DATA:  Epigastric pain. EXAM: CHEST  1 VIEW COMPARISON:  Chest x-ray dated January 21, 2019. FINDINGS: The heart size and mediastinal contours are within normal limits. Normal pulmonary vascularity. Mild atelectasis at the medial right lung base. No focal consolidation, pleural effusion, or pneumothorax. No acute osseous abnormality. IMPRESSION: 1. Mild right basilar atelectasis. Electronically Signed   By: Obie Dredge M.D.   On: 05/29/2023 13:45    Procedures Procedures    Medications Ordered in ED Medications  iohexol (OMNIPAQUE) 300 MG/ML solution  100 mL (100 mLs Intravenous Contrast Given 05/29/23 1407)    ED Course/ Medical Decision Making/ A&P                                 Medical Decision Making Amount and/or Complexity of Data Reviewed Labs: ordered.   BP (!) 152/95   Pulse 71   Temp (!) 97.5 F (36.4 C) (Oral)   Resp 15   Ht 5\' 8"  (1.727 m)   Wt 136.1 kg   LMP 01/12/2015   SpO2 99%   BMI 45.61 kg/m   3:52 PM She is a 26 female significant history of obesity, PAD, prediabetes, hypertension, hyperlipidemia brought here via EMS from home with complaint of abdominal pain.  Patient report this morning she took her usual morning medication and she also took some pain medication that has Tylenol and codeine in it.  Afterward she noticed pain about her abdomen.  She described pain as a sharp  uncomfortable sensation to her epigastric region that was persistent.  She felt nauseous she tried to vomit but pain did not resolved.  However while waiting in the ER pain is completely subsided.  There is no associated fever or chills no chest pain or shortness of breath no productive cough no urinary discomfort.  She did not eat anything this morning.  She denies having heartburn.  Exam overall reassuring, heart with normal rate and rhythm, lungs are clear to auscultation bilaterally abdomen is soft nontender no reproducible tenderness.  Patient overall well-appearing.  Vital sign overall reassuring no fever no hypoxia.  -Labs ordered, independently viewed and interpreted by me.  Labs remarkable for reassuring labs -The patient was maintained on a cardiac monitor.  I personally viewed and interpreted the cardiac monitored which showed an underlying rhythm of: NSR -Imaging independently viewed and interpreted by me and I agree with radiologist's interpretation.  Result remarkable for abd/pelvis CT showing no acute finding.  Incidental 3.4cm infrarenal AAA noted, recommend pt to have f/u US every 3 years.  Her CXR without acute changes -This patient presents to the ED for concern of abd pain, this involves an extensive number of treatment options, and is a complaint that carries with it a high risk of complications and morbidity.  The differential diagnosis includes colitis, diverticulitis, pancreatitis, appendicitis, cholecystitis, gastritis, GERD, MSK, shingles, dissection -Co morbidities that complicate the patient evaluation includes PAD, HTN, HLD -Treatment includes reassurance -Reevaluation of the patient after these medicines showed that the patient improved -PCP office notes or outside notes reviewed -Escalation to admission/observation considered: patients feels much better, is comfortable with discharge, and will follow up with PCP -Prescription medication considered, patient comfortable  with OTC meds -Social Determinant of Health considered which includes tobacco use, food insecurity, depression, financial difficulty           Final Clinical Impression(s) / ED Diagnoses Final diagnoses:  Generalized abdominal pain  Infrarenal abdominal aortic aneurysm (AAA) without rupture [facility])    Rx / DC Orders ED Discharge Orders     None         Fayrene Helper, PA-C 05/29/23 1830    Linwood Dibbles, MD 05/30/23 [phone]

## 2023-05-29 NOTE — ED Provider Triage Note (Signed)
Emergency Medicine Provider Triage Evaluation Note  MAYLIN KESSELRING , a 57 y.o. female  was evaluated in triage.  Pt complains of abdominal pain.  Started this morning with vomiting.  Endorses normal bowel but yesterday.  Pain is located in the epigastric region and radiates leftward.  Has any urinary symptoms.  Denies chest pain shortness of breath.  Review of Systems  Positive: See above Negative: See above  Physical Exam  BP (!) 106/58   Pulse 63   Temp 97.9 F (36.6 C) (Oral)   Resp 16   Ht 5\' 8"  (1.727 m)   Wt 136.1 kg   LMP 01/12/2015   SpO2 94%   BMI 45.61 kg/m  Gen:   Awake, no distress   Resp:  Normal effort  MSK:   Moves extremities without difficulty  Other:    Medical Decision Making  Medically screening exam initiated at 10:25 AM.  Appropriate orders placed.  Janyla A Aguillon was informed that the remainder of the evaluation will be completed by another provider, this initial triage assessment does not replace that evaluation, and the importance of remaining in the ED until their evaluation is complete.  Work up started   Gareth Eagle, PA-C 05/29/23 1026

## 2023-05-29 NOTE — Discharge Instructions (Signed)
You have been evaluated for your symptoms.  Fortunately no concerning findings were noted on today's exam.  Incidentally CT scan did mention that you have a infrarenal abdominal aortic aneurysm measuring 3.4 cm.  It is recommended for you to discuss this with your primary care doctor and to have a follow-up ultrasound every 3 years to monitor the size.  Return to ER if you have any concern.

## 2023-05-30 ENCOUNTER — Encounter (INDEPENDENT_AMBULATORY_CARE_PROVIDER_SITE_OTHER): Payer: Self-pay | Admitting: Primary Care

## 2023-06-19 ENCOUNTER — Ambulatory Visit: Payer: Self-pay | Admitting: Cardiology

## 2023-06-25 ENCOUNTER — Encounter: Payer: Self-pay | Admitting: Cardiology

## 2023-06-25 ENCOUNTER — Ambulatory Visit: Payer: Medicaid Other | Attending: Cardiology | Admitting: Cardiology

## 2023-06-25 VITALS — BP 116/75 | HR 74 | Resp 12 | Ht 68.0 in | Wt 304.2 lb

## 2023-06-25 DIAGNOSIS — I739 Peripheral vascular disease, unspecified: Secondary | ICD-10-CM

## 2023-06-25 DIAGNOSIS — I10 Essential (primary) hypertension: Secondary | ICD-10-CM | POA: Diagnosis not present

## 2023-06-25 DIAGNOSIS — I7143 Infrarenal abdominal aortic aneurysm, without rupture: Secondary | ICD-10-CM | POA: Diagnosis not present

## 2023-06-25 DIAGNOSIS — Z6841 Body Mass Index (BMI) 40.0 and over, adult: Secondary | ICD-10-CM

## 2023-06-25 DIAGNOSIS — E7800 Pure hypercholesterolemia, unspecified: Secondary | ICD-10-CM

## 2023-06-25 DIAGNOSIS — E66813 Obesity, class 3: Secondary | ICD-10-CM

## 2023-06-25 MED ORDER — ATORVASTATIN CALCIUM 20 MG PO TABS: 20.0000 mg | ORAL_TABLET | Freq: Every day | ORAL | 3 refills | Status: AC

## 2023-06-25 MED ORDER — CILOSTAZOL 50 MG PO TABS: 50.0000 mg | ORAL_TABLET | Freq: Two times a day (BID) | ORAL | 2 refills | Status: DC

## 2023-06-25 NOTE — Patient Instructions (Addendum)
 Medication Instructions:  Your physician has recommended you make the following change in your medication: Start Cilostazol  50 mg by mouth twice daily  *If you need a refill on your cardiac medications before your next appointment, please call your pharmacy*   Lab Work: none If you have labs (blood work) drawn today and your tests are completely normal, you will receive your results only by: MyChart Message (if you have MyChart) OR A paper copy in the mail If you have any lab test that is abnormal or we need to change your treatment, we will call you to review the results.   Testing/Procedures: Your physician has requested that you have a lower extremity arterial duplex. This test is an ultrasound of the arteries in the legs. It looks at arterial blood flow in the legs Allow one hour for Lower Arterial scans. There are no restrictions or special instructions.   Your physician has requested that you have an ankle brachial index (ABI). During this test an ultrasound and blood pressure cuff are used to evaluate the arteries that supply the arms and legs with blood. Allow thirty minutes for this exam. There are no restrictions or special instructions.  Please note: We ask at that you not bring children with you during ultrasound (echo/ vascular) testing. Due to room size and safety concerns, children are not allowed in the ultrasound rooms during exams. Our front office staff cannot provide observation of children in our lobby area while testing is being conducted. An adult accompanying a patient to their appointment will only be allowed in the ultrasound room at the discretion of the ultrasound technician under special circumstances. We apologize for any inconvenience.  Please note: We ask at that you not bring children with you during ultrasound (echo/ vascular) testing. Due to room size and safety concerns, children are not allowed in the ultrasound rooms during exams. Our front office staff  cannot provide observation of children in our lobby area while testing is being conducted. An adult accompanying a patient to their appointment will only be allowed in the ultrasound room at the discretion of the ultrasound technician under special circumstances. We apologize for any inconvenience.    Follow-Up: At [facility], you and your health needs are our priority.  As part of our continuing mission to provide you with exceptional heart care, we have created designated Provider Care Teams.  These Care Teams include your primary Cardiologist (physician) and Advanced Practice Providers (APPs -  Physician Assistants and Nurse Practitioners) who all work together to provide you with the care you need, when you need it.  We recommend signing up for the patient portal called "MyChart".  Sign up information is provided on this After Visit Summary.  MyChart is used to connect with patients for Virtual Visits (Telemedicine).  Patients are able to view lab/test results, encounter notes, upcoming appointments, etc.  Non-urgent messages can be sent to your provider as well.   To learn more about what you can do with MyChart, go to [email or web address].    Your next appointment:   2 month(s)  Provider:   Knox Perl, MD     Other Instructions

## 2023-06-25 NOTE — Progress Notes (Signed)
 Cardiology Office Note:  .   Date:  06/25/2023  ID:  Sharon Dyer, DOB [birth year], MRN [id number] PCP: Sharon Siemens, NP  [facility] Providers Cardiologist:  Knox Perl, MD   History of Present Illness: .   Sharon Dyer is a 58 y.o. African-American female patient with hypertension, hyperlipidemia, tobacco use disorder, obesity, severe vitamin D  deficiency presents for follow-up of HTN, peripheral arterial disease.  H/O right SFA atherectomy followed by drug-coated balloon angioplasty on 08/15/2020 and repeat procedure on 09/06/2022 for left SFA again with directional atherectomy followed by drug-coated balloon angioplasty.  Seen in the emergency room on 05/29/2023 for abdominal discomfort and CT scan revealing a small infrarenal abdominal Aortic  aneurysm measuring 3.4 cm.  Discussed the use of AI scribe software for clinical note transcription with the patient, who gave verbal consent to proceed.  History of Present Illness   The patient, with a history of peripheral artery disease (PAD), presents with worsening bilateral leg pain and significant weight gain. She reports that her legs feel like they weigh "a thousand pounds" and that she experiences fatigue and pain after only five minutes of walking. The pain is described as constant and is present throughout the entire leg. The patient also reports swelling in her feet, which she attributes to her inability to walk and subsequent weight gain.  The patient admits to restarting smoking around August or September of the previous year, which she attributes to anxiety. She also reports experiencing suicidal ideation, which she manages by going outside for fresh air or splashing cold water on her face. She denies any attempts to act on these thoughts.  The patient also reports a recent hospital visit in December for abdominal pain, during which a CT scan revealed a small aortic aneurysm and a hiatal hernia. She is currently taking four  medications daily, including a blood pressure medication and possibly atorvastatin  for cholesterol management. She also takes ibuprofen  approximately once a month for headaches.      Labs   Lab Results  Component Value Date   CHOL 131 02/26/2023   HDL 44 02/26/2023   LDLCALC 70 02/26/2023   TRIG 86 02/26/2023   CHOLHDL 3.0 02/26/2023   Lab Results  Component Value Date   NA 139 05/29/2023   K 3.3 (L) 05/29/2023   CO2 25 05/29/2023   GLUCOSE 96 05/29/2023   BUN 18 05/29/2023   CREATININE 0.69 05/29/2023   CALCIUM  9.0 05/29/2023   EGFR 82 02/26/2023   GFRNONAA >60 05/29/2023      Latest Ref Rng & Units 05/29/2023   11:04 AM 02/26/2023   11:03 AM 09/14/2022    9:46 AM  BMP  Glucose 70 - 99 mg/dL 96  83  562   BUN 6 - 20 mg/dL 18  11  11    Creatinine 0.44 - 1.00 mg/dL 1.30  8.65  7.84   BUN/Creat Ratio 9 - 23  13    Sodium 135 - 145 mmol/L 139  144  137   Potassium 3.5 - 5.1 mmol/L 3.3  4.1  3.4   Chloride 98 - 111 mmol/L 103  104  104   CO2 22 - 32 mmol/L 25  24  24    Calcium  8.9 - 10.3 mg/dL 9.0  9.2  8.6       Latest Ref Rng & Units 05/29/2023   11:04 AM 02/26/2023   11:03 AM 09/14/2022    9:46 AM  CBC  WBC 4.0 -  10.5 K/uL 11.4  10.2  9.3   Hemoglobin 12.0 - 15.0 g/dL 40.9  81.1  91.4   Hematocrit 36.0 - 46.0 % 42.3  41.0  31.8   Platelets 150 - 400 K/uL 190  204  203    Review of Systems  Cardiovascular:  Positive for claudication and leg swelling. Negative for chest pain and dyspnea on exertion.    Physical Exam:   VS:  BP 116/75 (BP Location: Left Arm, Patient Position: Sitting, Cuff Size: Large)   Pulse 74   Resp 12   Ht 5\' 8"  (1.727 m)   Wt (!) 304 lb 3.2 oz (138 kg)   LMP 01/12/2015   SpO2 98%   BMI 46.25 kg/m    Wt Readings from Last 3 Encounters:  06/25/23 (!) 304 lb 3.2 oz (138 kg)  05/29/23 300 lb (136.1 kg)  03/26/23 291 lb 9.6 oz (132.3 kg)    Physical Exam Constitutional:      Comments: Morbidly obese in no acute distress.  Neck:      Vascular: No carotid bruit or JVD.  Cardiovascular:     Rate and Rhythm: Normal rate and regular rhythm.     Pulses:          Carotid pulses are 2+ on the right side and 2+ on the left side.      Popliteal pulses are 0 on the right side and 0 on the left side.       Dorsalis pedis pulses are 0 on the right side and 0 on the left side.       Posterior tibial pulses are 0 on the right side and 0 on the left side.     Heart sounds: Normal heart sounds. No murmur heard.    No gallop.  Pulmonary:     Effort: Pulmonary effort is normal.     Breath sounds: Normal breath sounds.  Abdominal:     General: Bowel sounds are normal.     Palpations: Abdomen is soft.     Comments: Obese. Pannus present  Musculoskeletal:     Right lower leg: No edema.     Left lower leg: No edema.  Skin:    Capillary Refill: Capillary refill takes less than 2 seconds.     Studies Reviewed: .    Echocardiogram 07/25/2022:    Left ventricle: The cavity size was normal. Wall thickness was   increased in a pattern of mild LVH. There was mild focal basal   hypertrophy of the septum. Systolic function was normal. The   estimated ejection fraction was in the range of 55% to 60%. Wall   motion was normal; there were no regional wall motion  abnormalities. Doppler parameters are consistent with abnormal   left ventricular relaxation (grade 1 diastolic dysfunction).   Doppler parameters are consistent with high ventricular filling pressure. - Aortic valve: There was mild regurgitation. - Right ventricle: The cavity size was mildly dilated. - Pulmonary arteries: Systolic pressure was moderately to severely  increased. PA peak pressure: 58 mm Hg (S).  PCV MYOCARDIAL PERFUSION WITH LEXISCAN  08/13/2022   Narrative Lexiscan  Tetrofosmin stress test 08/13/2022: Lexiscan  nuclear stress test performed using 1-day protocol. SPECT images showed small sized, mild intensity, reversible perfusion defect in basal inferior myocardium.   Normal myocardial wall motion and thickening. Stress LVEF 60%. Peripheral arteriogram and angioplasty 08/16/2022 & 09/02/2022:   Right SFA: HawkOne followed by drug-coated balloon angioplasty with 6.0 x 150 mm InPact Admiral balloon  Left SFA HawkOne followed by [facility] angioplasty with 6.0 x 250 mm In.Pact Admiral balloon   ABI 09/20/2022:  This exam reveals normal perfusion of the right lower extremity (ABI 1.22) With mildly abnormal biphasic waveform pattern at the ankle.   This exam reveals mildly decreased perfusion of the left lower extremity, noted at the dorsalis pedis artery level (ABI 0.91) With mildly abnormal biphasic waveform pattern at the ankle. Compared to 07/23/2022, right ABI has improved from 0.73 and left ABI from 0.52. This represents successful revascularization of bilateral SFA  EKG:         EKG 08/06/2022: Sinus rhythm with borderline first-degree AV block at the rate of 66 bpm, normal axis, incomplete right bundle branch block. Poor R progression, cannot exclude anteroseptal infarct old.   Medications and allergies    No Known Allergies   Current Outpatient Medications:    acetaminophen -codeine  (TYLENOL  #3) 300-30 MG tablet, Take 1 tablet by mouth every 12 (twelve) hours as needed for moderate pain., Disp: 30 tablet, Rfl: 0   amLODipine  (NORVASC ) 10 MG tablet, TAKE ONE TABLET BY MOUTH DAILY, Disp: 90 tablet, Rfl: 1   aspirin  (ASPIRIN  CHILDRENS) 81 MG chewable tablet, Chew 1 tablet (81 mg total) by mouth daily., Disp: 90 tablet, Rfl: 1   Cholecalciferol (VITAMIN D ) 125 MCG (5000 UT) CAPS, Take 1 Capful by mouth daily., Disp: 90 capsule, Rfl: 3   cilostazol  (PLETAL ) 50 MG tablet, Take 1 tablet (50 mg total) by mouth 2 (two) times daily., Disp: 60 tablet, Rfl: 2   gabapentin  (NEURONTIN ) 300 MG capsule, Take 1 capsule (300 mg total) by mouth 3 (three) times daily., Disp: 30 capsule, Rfl: 0   ibuprofen  (ADVIL ) 800 MG tablet, Take 1 tablet (800 mg total) by mouth every 8  (eight) hours as needed for moderate pain or mild pain. (Patient taking differently: Take 800 mg by mouth every 8 (eight) hours as needed for moderate pain (pain score 4-6) (Takes rarely).), Disp: 30 tablet, Rfl: 0   losartan -hydrochlorothiazide  (HYZAAR) 100-25 MG tablet, Take 1 tablet by mouth daily., Disp: 90 tablet, Rfl: 1   triamcinolone  cream (KENALOG ) 0.1 %, Apply 1 Application topically 2 (two) times daily., Disp: 80 g, Rfl: 1   atorvastatin  (LIPITOR) 20 MG tablet, Take 1 tablet (20 mg total) by mouth daily., Disp: 90 tablet, Rfl: 3   ASSESSMENT AND PLAN: .      ICD-10-CM   1. Claudication in peripheral vascular disease (HCC)  I73.9 EKG 12-Lead    atorvastatin  (LIPITOR) 20 MG tablet    cilostazol  (PLETAL ) 50 MG tablet    VAS US  ABI WITH/WO TBI    VAS US  LOWER EXTREMITY ARTERIAL DUPLEX    2. Hypercholesteremia  E78.00 atorvastatin  (LIPITOR) 20 MG tablet    3. Primary hypertension  I10     4. Infrarenal abdominal aortic aneurysm (AAA) without rupture (HCC)  I71.43     5. Class 3 severe obesity due to excess calories with serious comorbidity and body mass index (BMI) of 45.0 to 49.9 in adult [facility])  Z61.096    Z68.42    E66.01       Assessment and Plan  Hypertension Well-controlled on amlodipine  10 mg and losartan  HCT 100/25 mg daily.    Peripheral Arterial Disease Recurrent leg pain and difficulty walking, likely due to resumed smoking and significant weight gain. Previous intervention in March 2024 initially improved symptoms. -Order lower extremity arterial duplex and Ankle-Brachial Index (ABI) to assess circulation. -If tests are abnormal, consider  angiogram. -Start Cilostazol  50mg  twice daily to improve walking distance. -Encourage smoking cessation.  Obesity Significant weight gain since last visit, contributing to leg pain and difficulty walking. BMI 46. -Refer to pharmacist for consideration of Wegovy  for weight management.  Hyperlipidemia Currently on  Atorvastatin , may need refill. -Refill Atorvastatin  prescription.  Abdominal Aortic Aneurysm Small aneurysm (3.4cm) identified on CT scan in December 2024. -Monitor for changes in size or symptoms.  Rescan in 4-5 years  Mental Health Reports of anxiety and suicidal ideation, though no attempts or plans. -Encourage use of crisis resources if suicidal ideation intensifies.  Follow-up in 2 months to review test results and assess response to new medication and weight management plan.      Signed,  Knox Perl, MD, [facility] 06/25/2023, 8:14 PM [facility] [address] [place], [place] [address] Phone: [phone]. Fax:  [phone]

## 2023-07-02 ENCOUNTER — Other Ambulatory Visit (HOSPITAL_COMMUNITY)
Admission: RE | Admit: 2023-07-02 | Discharge: 2023-07-02 | Disposition: A | Discharge status: Home / Self Care | Payer: Self-pay | Source: Ambulatory Visit | Attending: Oncology | Admitting: Oncology

## 2023-07-08 ENCOUNTER — Other Ambulatory Visit (INDEPENDENT_AMBULATORY_CARE_PROVIDER_SITE_OTHER): Payer: Self-pay | Admitting: Primary Care

## 2023-07-08 DIAGNOSIS — Z760 Encounter for issue of repeat prescription: Secondary | ICD-10-CM

## 2023-07-08 DIAGNOSIS — I10 Essential (primary) hypertension: Secondary | ICD-10-CM

## 2023-07-08 NOTE — Telephone Encounter (Signed)
Requested Prescriptions  Pending Prescriptions Disp Refills   amLODipine (NORVASC) 10 MG tablet [Pharmacy Med Name: AMLODIPINE BESYLATE 10MG  TABLETS] 90 tablet 0    Sig: TAKE 1 TABLET BY MOUTH DAILY     Cardiovascular: Calcium Channel Blockers 2 Passed - 07/08/2023  8:56 AM      Passed - Last BP in normal range    BP Readings from Last 1 Encounters:  06/25/23 116/75         Passed - Last Heart Rate in normal range    Pulse Readings from Last 1 Encounters:  06/25/23 74         Passed - Valid encounter within last 6 months    Recent Outpatient Visits           3 months ago Encounter for immunization   [place] [facility] Grayce Sessions, NP   4 months ago Colon cancer screening   [place] [facility] Grayce Sessions, NP   8 months ago Bilateral low back pain, unspecified chronicity, unspecified whether sciatica present   [place] [facility] Grayce Sessions, NP   11 months ago BP check   [place] [facility] Grayce Sessions, NP   1 year ago Respiratory symptoms   [place] [facility] Grayce Sessions, NP       Future Appointments             In 1 month Yates Decamp, MD [facility], [facility]             losartan-hydrochlorothiazide (HYZAAR) 100-25 MG tablet [Pharmacy Med Name: LOSARTAN/HCTZ 100/25MG  TABLETS] 90 tablet 0    Sig: TAKE 1 TABLET BY MOUTH DAILY     Cardiovascular: ARB + Diuretic Combos Failed - 07/08/2023  8:56 AM      Failed - K in normal range and within 180 days    Potassium  Date Value Ref Range Status  05/29/2023 3.3 (L) 3.5 - 5.1 mmol/L Final         Passed - Na in normal range and within 180 days    Sodium  Date Value Ref Range Status  05/29/2023 139 135 - 145 mmol/L Final  02/26/2023 144 134 - 144 mmol/L Final         Passed - Cr in normal range and within 180 days    Creatinine, Ser  Date Value  Ref Range Status  05/29/2023 0.69 0.44 - 1.00 mg/dL Final         Passed - eGFR is 10 or above and within 180 days    GFR calc Af Amer  Date Value Ref Range Status  10/04/2019 109 >59 mL/min/1.73 Final    Comment:    **[facility] currently reports eGFR in compliance with the current**   recommendations of the [employer]. [facility] will   update reporting as new guidelines are published from the NKF-ASN   Task force.    GFR, Estimated  Date Value Ref Range Status  05/29/2023 >60 >60 mL/min Final    Comment:    (NOTE) Calculated using the CKD-EPI Creatinine Equation (2021)    eGFR  Date Value Ref Range Status  02/26/2023 82 >59 mL/min/1.73 Final         Passed - Patient is not pregnant      Passed - Last BP in normal range    BP Readings from Last 1 Encounters:  06/25/23 116/75  Passed - Valid encounter within last 6 months    Recent Outpatient Visits           3 months ago Encounter for immunization   [place] [facility] Grayce Sessions, NP   4 months ago Colon cancer screening   [place] [facility] Grayce Sessions, NP   8 months ago Bilateral low back pain, unspecified chronicity, unspecified whether sciatica present   [place] [facility] Grayce Sessions, NP   11 months ago BP check   [place] [facility] Grayce Sessions, NP   1 year ago Respiratory symptoms    [facility] Grayce Sessions, NP       Future Appointments             In 1 month Yates Decamp, MD [facility], [facility]

## 2023-07-11 LAB — GENECONNECT MOLECULAR SCREEN: Genetic Analysis Overall Interpretation: NEGATIVE

## 2023-07-17 ENCOUNTER — Ambulatory Visit: Payer: Medicaid Other | Admitting: Cardiology

## 2023-07-21 ENCOUNTER — Ambulatory Visit (HOSPITAL_BASED_OUTPATIENT_CLINIC_OR_DEPARTMENT_OTHER)
Admission: RE | Admit: 2023-07-21 | Discharge: 2023-07-21 | Disposition: A | Discharge status: Home / Self Care | Payer: Medicaid Other | Source: Ambulatory Visit | Attending: Cardiology | Admitting: Cardiology

## 2023-07-21 ENCOUNTER — Ambulatory Visit (HOSPITAL_COMMUNITY)
Admission: RE | Admit: 2023-07-21 | Discharge: 2023-07-21 | Disposition: A | Discharge status: Home / Self Care | Payer: Medicaid Other | Source: Ambulatory Visit | Attending: Cardiology | Admitting: Cardiology

## 2023-07-21 DIAGNOSIS — I739 Peripheral vascular disease, unspecified: Secondary | ICD-10-CM

## 2023-07-21 LAB — VAS US ABI WITH/WO TBI
Left ABI: 0.64
Right ABI: 0.81

## 2023-07-21 NOTE — Progress Notes (Signed)
 Compared to 07/22/2022, right ABI has improved from 0.73 and 2 monophasic waveform to biphasic waveforms at the ankle.  Left ABI has further reduced with no change in waveform morphology.  Study also suggest left SFA high-grade hemodynamically significant stenosis versus CTO with collateralization.  Will discuss at office visit.  May need repeat peripheral arteriogram.

## 2023-07-24 ENCOUNTER — Encounter: Payer: Self-pay | Admitting: Cardiology

## 2023-07-24 ENCOUNTER — Encounter (INDEPENDENT_AMBULATORY_CARE_PROVIDER_SITE_OTHER): Payer: Self-pay | Admitting: Primary Care

## 2023-07-24 DIAGNOSIS — M25559 Pain in unspecified hip: Secondary | ICD-10-CM

## 2023-08-15 ENCOUNTER — Other Ambulatory Visit (INDEPENDENT_AMBULATORY_CARE_PROVIDER_SITE_OTHER): Payer: Self-pay

## 2023-08-15 ENCOUNTER — Other Ambulatory Visit: Payer: Self-pay

## 2023-08-15 ENCOUNTER — Ambulatory Visit: Payer: Medicaid Other | Admitting: Surgical

## 2023-08-15 DIAGNOSIS — M25551 Pain in right hip: Secondary | ICD-10-CM

## 2023-08-15 DIAGNOSIS — M25552 Pain in left hip: Secondary | ICD-10-CM | POA: Diagnosis not present

## 2023-08-24 ENCOUNTER — Encounter (INDEPENDENT_AMBULATORY_CARE_PROVIDER_SITE_OTHER): Payer: Self-pay | Admitting: Primary Care

## 2023-08-24 ENCOUNTER — Encounter: Payer: Self-pay | Admitting: Surgical

## 2023-08-24 MED ORDER — METHYLPREDNISOLONE ACETATE 40 MG/ML IJ SUSP
40.0000 mg | INTRAMUSCULAR | Status: AC | PRN
  Administered 2023-08-15: 40 mg via INTRA_ARTICULAR

## 2023-08-24 MED ORDER — LIDOCAINE HCL 1 % IJ SOLN
5.0000 mL | INTRAMUSCULAR | Status: AC | PRN
  Administered 2023-08-15: 5 mL

## 2023-08-24 MED ORDER — BUPIVACAINE HCL 0.25 % IJ SOLN
4.0000 mL | INTRAMUSCULAR | Status: AC | PRN
  Administered 2023-08-15: 4 mL via INTRA_ARTICULAR

## 2023-08-24 NOTE — Progress Notes (Signed)
 Office Visit Note   Patient: Sharon Dyer           Date of Birth: 01-02-1966           MRN: [id number] Visit Date: 08/15/2023 Requested by: Grayce Sessions, NP [address] [place],  [place] [address] PCP: Grayce Sessions, NP  Subjective: Chief Complaint  Patient presents with   Right Hip - Pain   Left Hip - Pain    HPI: Sharon Dyer is a 58 y.o. female who presents to the office reporting bilateral hip pain, left greater than right.  Patient states that she has had pain for about 3 weeks in her left hip and about 1 week in her right hip.  Came on without injury.  Denies any groin pain.  Mostly localizes pain to the lateral aspect of the left hip.  Pain is worse with walking and she has difficulty with sleeping.  Cannot lay on either side due to this hip pain and she has difficulty sleeping on her back because it causes nightmares for her.  No history of prior hip or back surgery.  It will very rarely radiate to her left knee down the outside of her leg.  No numbness and tingling aside from the chronic bilateral foot numbness/tingling that she has had for about 1 year..                ROS: All systems reviewed are negative as they relate to the chief complaint within the history of present illness.  Patient denies fevers or chills.  Assessment & Plan: Visit Diagnoses:  1. Greater trochanteric pain syndrome of left lower extremity   2. Bilateral hip pain     Plan: Patient is a 58 year old female who presents for evaluation of bilateral hip pain, left greater than right.  Left hip pain has been present for about 3 weeks and mostly localizes to the trochanteric region.  No Trendelenburg gait and no weakness as noted on exam today.  Not really any evidence of symptoms being referred pain from her low back.  We discussed options available to patient and she would like to try trochanteric injection under ultrasound guidance.  This was administered and patient tolerated procedure  well.  There was no resistance during administration of the injection and care was taken to avoid the gluteal tendon structures.  Recommended that she send a MyChart message in 1 to 2 weeks to let us know how she is doing and if no improvement, next step would be MRI of the pelvis versus MRI of the L-spine.  Follow-Up Instructions: No follow-ups on file.   Orders:  Orders Placed This Encounter  Procedures   XR Lumbar Spine 2-3 Views   XR HIPS BILAT W OR W/O PELVIS 2V   US Guided Needle Placement - No Linked Charges   No orders of the defined types were placed in this encounter.     Procedures: Large Joint Inj: L greater trochanter on 08/15/2023 5:19 PM Indications: pain and diagnostic evaluation Details: 22 G 3.5 in needle, ultrasound-guided lateral approach Medications: 5 mL lidocaine 1 %; 4 mL bupivacaine 0.25 %; 40 mg methylPREDNISolone acetate 40 MG/ML Outcome: tolerated well, no immediate complications Procedure, treatment alternatives, risks and benefits explained, specific risks discussed. Consent was given by the patient. Immediately prior to procedure a time out was called to verify the correct patient, procedure, equipment, support staff and site/side marked as required. Patient was prepped and draped in the usual  sterile fashion.       Clinical Data: No additional findings.  Objective: Vital Signs: LMP 01/12/2015   Physical Exam:  Constitutional: Patient appears well-developed HEENT:  Head: Normocephalic Eyes:EOM are normal Neck: Normal range of motion Cardiovascular: Normal rate Pulmonary/chest: Effort normal Neurologic: Patient is alert Skin: Skin is warm Psychiatric: Patient has normal mood and affect  Ortho Exam: Ortho exam demonstrates left hip with negative Stinchfield sign.  Negative FADIR sign.  She has tenderness moderately over the greater trochanter of the left hip and mildly over the greater trochanter of the right hip.  She ambulates without any  significant Trendelenburg gait.  Intact ankle dorsiflexion, plantarflexion, inversion, eversion.  Able to keep her leg in an abducted and internally rotated position while she is in lateral position without significant weakness.  This does reproduce her pain but she has no weakness.  Specialty Comments:  No specialty comments available.  Imaging: No results found.   PMFS History: Patient Active Problem List   Diagnosis Date Noted   Hypovitaminosis D 12/17/2022   Lower extremity edema 09/07/2022   Peripheral artery disease (HCC) 09/06/2022   Claudication in peripheral vascular disease (HCC) 08/16/2022   AKI (acute kidney injury) (HCC) 01/29/2018   Hypokalemia 01/29/2018   Respiratory distress 01/29/2018   Tobacco abuse 01/29/2018   Hypoxemia 12/22/2017   Past Medical History:  Diagnosis Date   Anxiety    Arthritis    Patient verbalizes she has athritis to right ankle   Dyspnea    Frozen shoulder syndrome 07/30/2019   Hyperlipidemia    Hypertension    Obesity    PAD (peripheral artery disease) (HCC)    Pre-diabetes     Family History  Problem Relation Age of Onset   Breast cancer Mother        in 19s   Hypertension Mother    Stroke Mother     Past Surgical History:  Procedure Laterality Date   ABDOMINAL AORTOGRAM W/LOWER EXTREMITY Bilateral 08/16/2022   Procedure: ABDOMINAL AORTOGRAM W/LOWER EXTREMITY;  Surgeon: Yates Decamp, MD;  Location: [facility] INVASIVE CV LAB;  Service: Cardiovascular;  Laterality: Bilateral;   ABDOMINAL AORTOGRAM W/LOWER EXTREMITY N/A 09/06/2022   Procedure: ABDOMINAL AORTOGRAM W/LOWER EXTREMITY;  Surgeon: Yates Decamp, MD;  Location: [facility] INVASIVE CV LAB;  Service: Cardiovascular;  Laterality: N/A;   CHOLECYSTECTOMY N/A 09/30/2022   Procedure: LAPAROSCOPIC CHOLECYSTECTOMY;  Surgeon: Quentin Ore, MD;  Location: [facility] ORS;  Service: General;  Laterality: N/A;   ECTOPIC PREGNANCY SURGERY  X 2   PERIPHERAL VASCULAR ATHERECTOMY Right 08/16/2022   Procedure:  PERIPHERAL VASCULAR ATHERECTOMY;  Surgeon: Yates Decamp, MD;  [facility] INVASIVE CV LAB;  Service: Cardiovascular;  Laterality: Right;  R SFA   PERIPHERAL VASCULAR ATHERECTOMY  09/06/2022   Procedure: PERIPHERAL VASCULAR ATHERECTOMY;  Surgeon: Yates Decamp, MD;  Location: [facility] INVASIVE CV LAB;  Service: Cardiovascular;;   PERIPHERAL VASCULAR BALLOON ANGIOPLASTY  08/16/2022   Procedure: PERIPHERAL VASCULAR BALLOON ANGIOPLASTY;  Surgeon: Yates Decamp, MD;  Location: [facility] INVASIVE CV LAB;  Service: Cardiovascular;;   PERIPHERAL VASCULAR INTERVENTION  09/06/2022   Procedure: PERIPHERAL VASCULAR INTERVENTION;  Surgeon: Yates Decamp, MD;  Location: [facility] INVASIVE CV LAB;  Service: Cardiovascular;;   Social History   Occupational History   Not on file  Tobacco Use   Smoking status: Some Days    Current packs/day: 0.15    Average packs/day: 0.2 packs/day for 42.2 years (6.3 ttl pk-yrs)    Types: Cigarettes    Start date: 52  Smokeless tobacco: Never   Tobacco comments:    Started on Wellbutrin for depression  Vaping Use   Vaping status: Never Used  Substance and Sexual Activity   Alcohol use: Not Currently    Comment: 12/22/2017 "maybe twice a year"   Drug use: Never   Sexual activity: Yes    Birth control/protection: None

## 2023-08-26 ENCOUNTER — Other Ambulatory Visit (INDEPENDENT_AMBULATORY_CARE_PROVIDER_SITE_OTHER): Payer: Self-pay | Admitting: Primary Care

## 2023-08-26 DIAGNOSIS — I10 Essential (primary) hypertension: Secondary | ICD-10-CM

## 2023-08-26 DIAGNOSIS — Z760 Encounter for issue of repeat prescription: Secondary | ICD-10-CM

## 2023-08-26 NOTE — Telephone Encounter (Signed)
 Copied from CRM [id number]. Topic: Clinical - Medication Refill >> Aug 26, 2023  3:49 PM Gildardo Pounds wrote: Most Recent Primary Care Visit:  Provider: Grayce Sessions  Department: [facility]  Visit Type: OFFICE VISIT  Date: 03/26/2023  Medication: amLODipine (NORVASC) 10 MG tablet  losartan-hydrochlorothiazide (HYZAAR) 100-25 MG tablet  Has the patient contacted their pharmacy? Yes (Agent: If no, request that the patient contact the pharmacy for the refill. If patient does not wish to contact the pharmacy document the reason why and proceed with request.) (Agent: If yes, when and what did the pharmacy advise?)  Is this the correct pharmacy for this prescription? Yes If no, delete pharmacy and type the correct one.  This is the patient's preferred pharmacy:  [facility] [address] ([address] [place] - [address] [place]) [place] [address] Phone: [phone] Fax: [phone]     Has the prescription been filled recently? No  Is the patient out of the medication? Yes  Has the patient been seen for an appointment in the last year OR does the patient have an upcoming appointment? Yes  Can we respond through MyChart? Yes  Agent: Please be advised that Rx refills may take up to 3 business days. We ask that you follow-up with your pharmacy.

## 2023-08-28 MED ORDER — AMLODIPINE BESYLATE 10 MG PO TABS
10.0000 mg | ORAL_TABLET | Freq: Every day | ORAL | 0 refills | Status: DC
Start: 2023-08-28 — End: 2023-12-01

## 2023-08-28 MED ORDER — LOSARTAN POTASSIUM-HCTZ 100-25 MG PO TABS: 1.0000 | ORAL_TABLET | Freq: Every day | ORAL | 0 refills | Status: DC

## 2023-08-28 NOTE — Telephone Encounter (Signed)
 Requested Prescriptions  Pending Prescriptions Disp Refills   amLODipine (NORVASC) 10 MG tablet 90 tablet 0    Sig: Take 1 tablet (10 mg total) by mouth daily.     Cardiovascular: Calcium Channel Blockers 2 Passed - 08/28/2023  8:38 AM      Passed - Last BP in normal range    BP Readings from Last 1 Encounters:  06/25/23 116/75         Passed - Last Heart Rate in normal range    Pulse Readings from Last 1 Encounters:  06/25/23 74         Passed - Valid encounter within last 6 months    Recent Outpatient Visits           5 months ago Encounter for immunization   [place] [facility] Grayce Sessions, NP   6 months ago Colon cancer screening   [place] [facility] Grayce Sessions, NP   9 months ago Bilateral low back pain, unspecified chronicity, unspecified whether sciatica present   [place] [facility] Grayce Sessions, NP   1 year ago BP check   [place] [facility] Grayce Sessions, NP   1 year ago Respiratory symptoms   [place] [facility] Grayce Sessions, NP       Future Appointments             In 6 days Yates Decamp, MD [name]             losartan-hydrochlorothiazide (HYZAAR) 100-25 MG tablet 90 tablet 0    Sig: Take 1 tablet by mouth daily.     Cardiovascular: ARB + Diuretic Combos Failed - 08/28/2023  8:38 AM      Failed - K in normal range and within 180 days    Potassium  Date Value Ref Range Status  05/29/2023 3.3 (L) 3.5 - 5.1 mmol/L Final         Passed - Na in normal range and within 180 days    Sodium  Date Value Ref Range Status  05/29/2023 139 135 - 145 mmol/L Final  02/26/2023 144 134 - 144 mmol/L Final         Passed - Cr in normal range and within 180 days    Creatinine, Ser  Date Value Ref Range Status  05/29/2023 0.69 0.44 - 1.00 mg/dL Final         Passed - eGFR is 10 or  above and within 180 days    GFR calc Af Amer  Date Value Ref Range Status  10/04/2019 109 >59 mL/min/1.73 Final    Comment:    **[facility] currently reports eGFR in compliance with the current**   recommendations of the [employer]. [facility] will   update reporting as new guidelines are published from the NKF-ASN   Task force.    GFR, Estimated  Date Value Ref Range Status  05/29/2023 >60 >60 mL/min Final    Comment:    (NOTE) Calculated using the CKD-EPI Creatinine Equation (2021)    eGFR  Date Value Ref Range Status  02/26/2023 82 >59 mL/min/1.73 Final         Passed - Patient is not pregnant      Passed - Last BP in normal range    BP Readings from Last 1 Encounters:  06/25/23 116/75         Passed - Valid encounter within  last 6 months    Recent Outpatient Visits           5 months ago Encounter for immunization   [place] [facility] Grayce Sessions, NP   6 months ago Colon cancer screening   [place] [facility] Grayce Sessions, NP   9 months ago Bilateral low back pain, unspecified chronicity, unspecified whether sciatica present   [place] [facility] Grayce Sessions, NP   1 year ago BP check    [facility] Grayce Sessions, NP   1 year ago Respiratory symptoms    [facility] Grayce Sessions, NP       Future Appointments             In 6 days Yates Decamp, MD [facility] at [facility]

## 2023-09-03 ENCOUNTER — Ambulatory Visit: Payer: Medicaid Other | Attending: Cardiology | Admitting: Cardiology

## 2023-09-03 ENCOUNTER — Encounter: Payer: Self-pay | Admitting: Cardiology

## 2023-09-03 VITALS — BP 120/68 | HR 74 | Resp 16 | Ht 68.0 in | Wt 307.4 lb

## 2023-09-03 DIAGNOSIS — I739 Peripheral vascular disease, unspecified: Secondary | ICD-10-CM | POA: Diagnosis not present

## 2023-09-03 DIAGNOSIS — I10 Essential (primary) hypertension: Secondary | ICD-10-CM | POA: Diagnosis not present

## 2023-09-03 DIAGNOSIS — E7800 Pure hypercholesterolemia, unspecified: Secondary | ICD-10-CM

## 2023-09-03 DIAGNOSIS — Z6841 Body Mass Index (BMI) 40.0 and over, adult: Secondary | ICD-10-CM

## 2023-09-03 DIAGNOSIS — E66813 Obesity, class 3: Secondary | ICD-10-CM

## 2023-09-03 DIAGNOSIS — F17200 Nicotine dependence, unspecified, uncomplicated: Secondary | ICD-10-CM | POA: Diagnosis not present

## 2023-09-03 NOTE — Progress Notes (Signed)
 Cardiology Office Note:  .   Date:  09/03/2023  ID:  Sharon Dyer, DOB 02-26-1966, MRN [id number] PCP: Grayce Sessions, NP  [facility] Providers Cardiologist:  Yates Decamp, MD   History of Present Illness: .   Sharon Dyer is a 58 y.o. African-American female patient with hypertension, hypercholesterolemia, ongoing tobacco use disorder, morbid obesity, severe hypovitaminosis D and severe peripheral arterial disease with bilateral SFA angioplasty in 2022, small AAA measuring about 3.4 cm by CT in 2024 presents here for 16-month follow-up.  She has had a low risk nuclear stress test on 08/13/2022 and normal LV EF by echocardiogram on 07/25/2022.  Discussed the use of AI scribe software for clinical note transcription with the patient, who gave verbal consent to proceed.  History of Present Illness The patient, with a history of peripheral arterial disease (PAD), presents with worsening leg pain and obesity. She reports that her legs hurt when she tries to walk, and she has to sit down after about five to eight minutes of walking in a grocery store. She also reports numbness in her feet, which has moved from her toes to her feet. She has a history of vascular claudication, and she has had work done on her legs in the past. She also reports that she is still smoking, but has reduced her smoking to one or two cigarettes a week, and she wears patches to help her quit. She has recently moved and does not smoke inside her new house. She also reports that she is not taking her prescribed vitamin D because she could not afford all her medications at one point. She is not currently working. She also reports that she is dealing with anxiety and depression related to the anniversary of her mother's death. She has trouble sleeping and wakes up in the middle of the night. She also reports that she has a small abdominal aortic aneurysm that was checked by CT scan in 2024 and needs to be rescanned in about  three years.  Labs   Lab Results  Component Value Date   CHOL 131 02/26/2023   HDL 44 02/26/2023   LDLCALC 70 02/26/2023   TRIG 86 02/26/2023   CHOLHDL 3.0 02/26/2023   Lab Results  Component Value Date   NA 139 05/29/2023   K 3.3 (L) 05/29/2023   CO2 25 05/29/2023   GLUCOSE 96 05/29/2023   BUN 18 05/29/2023   CREATININE 0.69 05/29/2023   CALCIUM 9.0 05/29/2023   EGFR 82 02/26/2023   GFRNONAA >60 05/29/2023      Latest Ref Rng & Units 05/29/2023   11:04 AM 02/26/2023   11:03 AM 09/14/2022    9:46 AM  BMP  Glucose 70 - 99 mg/dL 96  83  952   BUN 6 - 20 mg/dL 18  11  11    Creatinine 0.44 - 1.00 mg/dL 8.41  3.24  4.01   BUN/Creat Ratio 9 - 23  13    Sodium 135 - 145 mmol/L 139  144  137   Potassium 3.5 - 5.1 mmol/L 3.3  4.1  3.4   Chloride 98 - 111 mmol/L 103  104  104   CO2 22 - 32 mmol/L 25  24  24    Calcium 8.9 - 10.3 mg/dL 9.0  9.2  8.6       Latest Ref Rng & Units 05/29/2023   11:04 AM 02/26/2023   11:03 AM 09/14/2022    9:46 AM  CBC  WBC 4.0 - 10.5 K/uL 11.4  10.2  9.3   Hemoglobin 12.0 - 15.0 g/dL 87.5  64.3  32.9   Hematocrit 36.0 - 46.0 % 42.3  41.0  31.8   Platelets 150 - 400 K/uL 190  204  203    Lab Results  Component Value Date   HGBA1C 5.3 09/25/2022    Lab Results  Component Value Date   TSH 1.240 02/05/2019    Review of Systems  Cardiovascular:  Positive for claudication (bilateral calf and feet). Negative for chest pain, dyspnea on exertion and leg swelling.   Physical Exam:   VS:  BP 120/68 (BP Location: Left Arm, Patient Position: Sitting, Cuff Size: Large)   Pulse 74   Resp 16   Ht 5\' 8"  (1.727 m)   Wt (!) 307 lb 6.4 oz (139.4 kg)   LMP 01/12/2015   SpO2 97%   BMI 46.74 kg/m    Wt Readings from Last 3 Encounters:  09/03/23 (!) 307 lb 6.4 oz (139.4 kg)  06/25/23 (!) 304 lb 3.2 oz (138 kg)  05/29/23 300 lb (136.1 kg)    Physical Exam Constitutional:      Comments: Morbidly obese in no acute distress.  Neck:     Vascular: No  carotid bruit or JVD.  Cardiovascular:     Rate and Rhythm: Normal rate and regular rhythm.     Pulses:          Dorsalis pedis pulses are 0 on the right side and 0 on the left side.       Posterior tibial pulses are 0 on the right side and 0 on the left side.     Heart sounds: Normal heart sounds. No murmur heard.    No gallop.  Pulmonary:     Effort: Pulmonary effort is normal.     Breath sounds: Normal breath sounds.  Abdominal:     General: Bowel sounds are normal.     Palpations: Abdomen is soft.     Comments: Obese. Pannus present  Musculoskeletal:     Right lower leg: No edema.     Left lower leg: No edema.    Studies Reviewed: Marland Kitchen     Peripheral arteriogram and angioplasty 08/16/2022: Intervention data: Successful directional atherectomy with HawkOne followed by drug-coated balloon angioplasty with 6.0 x 150 mm InPact Admiral balloon, right mid SFA stenosis reduced from 80% to 0% with brisk flow and maintenance of distal runoff.    Recommendation: Patient will be discharged home today with outpatient follow-up, I may even schedule her for elective left lower extremity arteriogram and angioplasty as it is amenable for percutaneous revascularization in view of her severe symptoms.  Left SFA angioplasty 09/06/2022: Successful directional atherectomy with HawkOne followed by [facility] angioplasty with 6.0 x 250 mm In.Pact Admiral balloon, stenosis reduced from 100% to less than 5%.  Brisk flow noted below the proximal knee.  Complex procedure.  Prolonged procedure with multiple wires, multiple catheters and balloons.       ABI 07/21/2023:  Right ABI 0.81 with multiphasic waveforms.  Left ABI 0.64 with monophasic waveform.  Compared to 07/22/2022, right ABI has improved from 0.73 and 2 monophasic waveform to biphasic waveforms at the ankle.  Left ABI has further reduced with no change in waveform morphology.  Study also suggest left SFA high-grade hemodynamically significant stenosis  versus CTO with collateralization.   EKG:    EKG Interpretation Date/Time:  Wednesday September 03 2023 08:20:29 EDT Ventricular Rate:  68 PR Interval:  192 QRS Duration:  76 QT Interval:  398 QTC Calculation: 423 R Axis:   -18  Text Interpretation: EKG 09/03/2023: Normal sinus rhythm at rate of 68 bpm, poor R progression, probably normal variant.  Low-voltage complexes.  No evidence of ischemia. Compared to 06/25/2023, no significant change. Confirmed by Delrae Rend [phone]) on 09/03/2023 8:32:29 AM    Medications and allergies    No Known Allergies   Current Outpatient Medications:    amLODipine (NORVASC) 10 MG tablet, Take 1 tablet (10 mg total) by mouth daily., Disp: 90 tablet, Rfl: 0   aspirin (ASPIRIN CHILDRENS) 81 MG chewable tablet, Chew 1 tablet (81 mg total) by mouth daily., Disp: 90 tablet, Rfl: 1   atorvastatin (LIPITOR) 20 MG tablet, Take 1 tablet (20 mg total) by mouth daily., Disp: 90 tablet, Rfl: 3   cilostazol (PLETAL) 50 MG tablet, Take 1 tablet (50 mg total) by mouth 2 (two) times daily., Disp: 60 tablet, Rfl: 2   gabapentin (NEURONTIN) 300 MG capsule, Take 1 capsule (300 mg total) by mouth 3 (three) times daily., Disp: 30 capsule, Rfl: 0   losartan-hydrochlorothiazide (HYZAAR) 100-25 MG tablet, Take 1 tablet by mouth daily., Disp: 90 tablet, Rfl: 0   triamcinolone cream (KENALOG) 0.1 %, Apply 1 Application topically 2 (two) times daily., Disp: 80 g, Rfl: 1   ASSESSMENT AND PLAN: .      ICD-10-CM   1. Claudication in peripheral vascular disease (HCC)  I73.9 VAS [place] ABI WITH/WO TBI    2. Hypercholesteremia  E78.00     3. Primary hypertension  I10 EKG 12-Lead    4. Tobacco use disorder  F17.200     5. Class 3 severe obesity due to excess calories with serious comorbidity and body mass index (BMI) of 45.0 to 49.9 in adult [facility])  U04.540 AMB Referral to [facility] Pharm[facility]   Z68.42    E66.01       No orders of the defined types were placed in this encounter.    Medications Discontinued During This Encounter  Medication Reason   acetaminophen-codeine (TYLENOL #3) 300-30 MG tablet Completed Course   Cholecalciferol (VITAMIN D) 125 MCG (5000 UT) CAPS Patient Preference   ibuprofen (ADVIL) 800 MG tablet Completed Course    Assessment & Plan Peripheral Arterial Disease (PAD) with Claudication   She experiences claudication, particularly in the left leg, likely due to recurrent blockage causing cramping and pain. Medical management will continue while awaiting a repeat study in three months to assess for worsening before considering further intervention. Continue aspirin therapy and cilostazol 50 mg twice a day. Repeat ABI in 2-3 months and follow up in 3 months.  Morbid Obesity   Morbid obesity contributes to her leg pain and overall health issues. Discussed GLP-1 agonists for weight loss, noting potential nausea and vomiting with oily or fried foods. She is motivated to lose weight despite potential side effects. Emphasized eating slowly and avoiding certain foods to minimize adverse effects. Refer to pharmacy for GLP-1 agonist approval for weight loss. Advise to eat slowly and avoid oily or fried foods.  Hypercholesterolemia   Her hypercholesterolemia is well managed with current medication, and her LDL levels are within the target range. Continue current cholesterol medication 20 mg once a day.  Hypertension   Her blood pressure is well controlled with amlodipine and losartan. Continue amlodipine 10 mg once a day and losartan as prescribed.  Tobacco Use   She continues to smoke occasionally, negatively impacting  her vascular health. Emphasized the importance of quitting smoking to improve health outcomes. Advise to quit smoking.  Abdominal Aortic Aneurysm   She has a small abdominal aortic aneurysm, last checked by CT scan in 2024, which does not require immediate intervention. The next scan is planned for 2026 or 2027. Rescan abdominal aortic  aneurysm in 2026 or 2027.   Signed,  Yates Decamp, MD, [facility] 09/03/2023, 9:02 AM [facility] [address] [place], [place] [address] Phone: [phone]. Fax:  [phone]

## 2023-09-03 NOTE — Patient Instructions (Signed)
 Medication Instructions:  Your physician recommends that you continue on your current medications as directed. Please refer to the Current Medication list given to you today.  *If you need a refill on your cardiac medications before your next appointment, please call your pharmacy*   Lab Work: none If you have labs (blood work) drawn today and your tests are completely normal, you will receive your results only by: MyChart Message (if you have MyChart) OR A paper copy in the mail If you have any lab test that is abnormal or we need to change your treatment, we will call you to review the results.   Testing/Procedures: Your physician has requested that you have an ankle brachial index (ABI) in 3 months.. During this test an ultrasound and blood pressure cuff are used to evaluate the arteries that supply the arms and legs with blood. Allow thirty minutes for this exam. There are no restrictions or special instructions.  Please note: We ask at that you not bring children with you during ultrasound (echo/ vascular) testing. Due to room size and safety concerns, children are not allowed in the ultrasound rooms during exams. Our front office staff cannot provide observation of children in our lobby area while testing is being conducted. An adult accompanying a patient to their appointment will only be allowed in the ultrasound room at the discretion of the ultrasound technician under special circumstances. We apologize for any inconvenience.    Follow-Up: At [facility], you and your health needs are our priority.  As part of our continuing mission to provide you with exceptional heart care, we have created designated Provider Care Teams.  These Care Teams include your primary Cardiologist (physician) and Advanced Practice Providers (APPs -  Physician Assistants and Nurse Practitioners) who all work together to provide you with the care you need, when you need it.  We recommend signing up  for the patient portal called "MyChart".  Sign up information is provided on this After Visit Summary.  MyChart is used to connect with patients for Virtual Visits (Telemedicine).  Patients are able to view lab/test results, encounter notes, upcoming appointments, etc.  Non-urgent messages can be sent to your provider as well.   To learn more about what you can do with MyChart, go to [email or web address].    Your next appointment:   3 month(s)  Provider:   Yates Decamp, MD     Other Instructions You have been referred to see the pharmacist in our office.  Please schedule new patient appointment    1st Floor: - Lobby - Registration  - Pharmacy  - Lab - Cafe  2nd Floor: - PV Lab - Diagnostic Testing (echo, CT, nuclear med)  3rd Floor: - Vacant  4th Floor: - TCTS (cardiothoracic surgery) - AFib Clinic - Structural Heart Clinic - Vascular Surgery  - Vascular Ultrasound  5th Floor: - HeartCare Cardiology (general and EP) - Clinical Pharmacy for coumadin, hypertension, lipid, weight-loss medications, and med management appointments    Valet parking services will be available as well.

## 2023-09-09 ENCOUNTER — Other Ambulatory Visit (HOSPITAL_COMMUNITY): Payer: Self-pay

## 2023-09-09 ENCOUNTER — Telehealth: Payer: Self-pay | Admitting: Pharmacist

## 2023-09-09 ENCOUNTER — Telehealth: Payer: Self-pay | Admitting: Pharmacy Technician

## 2023-09-09 NOTE — Telephone Encounter (Signed)
 Pharmacy Patient Advocate Encounter   Received notification from Pt Calls Messages that prior authorization for wegovy is required/requested.   Insurance verification completed.   The patient is insured through [facility] .   Per test claim: PA required; PA submitted to above mentioned insurance via CoverMyMeds Key/confirmation #/EOC [id number] Status is pending

## 2023-09-10 ENCOUNTER — Other Ambulatory Visit (HOSPITAL_COMMUNITY): Payer: Self-pay

## 2023-09-10 ENCOUNTER — Encounter: Payer: Self-pay | Admitting: Pharmacist

## 2023-09-10 ENCOUNTER — Ambulatory Visit: Attending: Cardiovascular Disease | Admitting: Pharmacist

## 2023-09-10 VITALS — Ht 68.0 in | Wt 308.5 lb

## 2023-09-10 DIAGNOSIS — Z6841 Body Mass Index (BMI) 40.0 and over, adult: Secondary | ICD-10-CM | POA: Diagnosis not present

## 2023-09-10 DIAGNOSIS — E66813 Obesity, class 3: Secondary | ICD-10-CM

## 2023-09-10 NOTE — Progress Notes (Signed)
 Patient ID: GRIFFIN DEWILDE                 DOB: Sep 06, 1965                    MRN: [id number]     HPI: Sharon Dyer is a 58 y.o. female patient referred to pharmacy clinic by [facility] to initiate GLP1-RA therapy. PMH is significant for PAD, tobacco smoking and obesity. Most recent BMI 46.91 kg/m .  Baseline weight and BMI: 300 lbs 45.63 kg/m  Current weight and BMI: 308.5 lbs 46.91 kg/m  Current meds that affect weight: none   From last few weeks she has stopped consuming sodas that has helped to reduced weight around 4 lbs. She is going through rough time . Mar 31, 2025was her mother death anniversary and that is affecting her the most. She is trying to quit smoking. PAD restricts her mobility and walking distance. However willing to implement chair exercise and walk 5 min twice daily   Diet:   Uses air fryer however like to eat  fried chicken and bacon  Loves sweets, sodas     Exercise: none  Willing to start 5 min walks twice daily and 5-10 min of chair exercise   Family History:  Relation Problem Comments  Mother (Deceased) Breast cancer in 49s  Hypertension   Stroke      Social History:  Alcohol: none  Smoking: trying to quit using nicotine patches  Labs: Lab Results  Component Value Date   HGBA1C 5.3 09/25/2022    Wt Readings from Last 1 Encounters:  09/03/23 (!) 307 lb 6.4 oz (139.4 kg)    BP Readings from Last 1 Encounters:  09/03/23 120/68   Pulse Readings from Last 1 Encounters:  09/03/23 74       Component Value Date/Time   CHOL 131 02/26/2023 1103   TRIG 86 02/26/2023 1103   HDL 44 02/26/2023 1103   CHOLHDL 3.0 02/26/2023 1103   LDLCALC 70 02/26/2023 1103    Past Medical History:  Diagnosis Date   Anxiety    Arthritis    Patient verbalizes she has athritis to right ankle   Dyspnea    Frozen shoulder syndrome 07/30/2019   Hyperlipidemia    Hypertension    Obesity    PAD (peripheral artery disease) (HCC)    Pre-diabetes     Current  Outpatient Medications on File Prior to Visit  Medication Sig Dispense Refill   amLODipine (NORVASC) 10 MG tablet Take 1 tablet (10 mg total) by mouth daily. 90 tablet 0   aspirin (ASPIRIN CHILDRENS) 81 MG chewable tablet Chew 1 tablet (81 mg total) by mouth daily. 90 tablet 1   atorvastatin (LIPITOR) 20 MG tablet Take 1 tablet (20 mg total) by mouth daily. 90 tablet 3   cilostazol (PLETAL) 50 MG tablet Take 1 tablet (50 mg total) by mouth 2 (two) times daily. 60 tablet 2   gabapentin (NEURONTIN) 300 MG capsule Take 1 capsule (300 mg total) by mouth 3 (three) times daily. 30 capsule 0   losartan-hydrochlorothiazide (HYZAAR) 100-25 MG tablet Take 1 tablet by mouth daily. 90 tablet 0   triamcinolone cream (KENALOG) 0.1 % Apply 1 Application topically 2 (two) times daily. 80 g 1   No current facility-administered medications on file prior to visit.    No Known Allergies   Assessment/Plan:  1. Weight loss - Patient has not met goal of at least 5% of body weight loss  with comprehensive lifestyle modifications alone in the past 3-6 months. Pharmacotherapy is appropriate to pursue as augmentation. Will start coverage assessment for [facility]. Confirmed patient has no personal or family history of medullary thyroid carcinoma (MTC) or Multiple Endocrine Neoplasia syndrome type 2 (MEN 2). Injection technique reviewed at today's visit.  Advised patient on common side effects including nausea, diarrhea, dyspepsia, decreased appetite, and fatigue. Counseled patient on reducing meal size and how to titrate medication to minimize side effects. Counseled patient to call if intolerable side effects or if experiencing dehydration, abdominal pain, or dizziness. Patient will adhere to dietary modifications and will target at least 150 minutes of moderate intensity exercise weekly.   Follow up in 1 month via telephone for tolerability update and dose titration.

## 2023-09-10 NOTE — Telephone Encounter (Signed)
 [facility] coverage assessment sent to the PA pool.

## 2023-09-10 NOTE — Telephone Encounter (Signed)
 Pharmacy Patient Advocate Encounter  Received notification from [facility] that Prior Authorization for wegovy has been APPROVED from 09/10/23 to 03/11/24. Ran test claim, Copay is $4.00. This test claim was processed through [facility]- copay amounts may vary at other pharmacies due to pharmacy/plan contracts, or as the patient moves through the different stages of their insurance plan.

## 2023-09-11 ENCOUNTER — Other Ambulatory Visit (HOSPITAL_COMMUNITY): Payer: Self-pay

## 2023-09-11 MED ORDER — WEGOVY 0.25 MG/0.5ML ~~LOC~~ SOAJ
0.2500 mg | SUBCUTANEOUS | 0 refills | Status: DC
  Filled 2023-09-11: qty 2, 28d supply, fill #0

## 2023-09-11 NOTE — Addendum Note (Signed)
 Addended by: Tylene Fantasia on: 09/11/2023 12:51 PM   Modules accepted: Orders

## 2023-09-12 ENCOUNTER — Other Ambulatory Visit (HOSPITAL_COMMUNITY): Payer: Self-pay

## 2023-10-06 ENCOUNTER — Other Ambulatory Visit: Payer: Self-pay | Admitting: Cardiology

## 2023-10-06 ENCOUNTER — Other Ambulatory Visit (HOSPITAL_COMMUNITY): Payer: Self-pay

## 2023-10-06 ENCOUNTER — Encounter: Payer: Self-pay | Admitting: Cardiology

## 2023-10-06 DIAGNOSIS — I739 Peripheral vascular disease, unspecified: Secondary | ICD-10-CM

## 2023-10-06 DIAGNOSIS — E66813 Obesity, class 3: Secondary | ICD-10-CM

## 2023-10-06 MED ORDER — WEGOVY 0.5 MG/0.5ML ~~LOC~~ SOAJ
0.5000 mg | SUBCUTANEOUS | 0 refills | Status: DC
  Filled 2023-10-06: qty 2, 28d supply, fill #0

## 2023-10-08 ENCOUNTER — Encounter (HOSPITAL_COMMUNITY): Payer: Self-pay

## 2023-10-08 ENCOUNTER — Other Ambulatory Visit (HOSPITAL_COMMUNITY): Payer: Self-pay

## 2023-10-08 ENCOUNTER — Encounter: Payer: Self-pay | Admitting: Cardiology

## 2023-10-08 ENCOUNTER — Telehealth: Payer: Self-pay | Admitting: Cardiology

## 2023-10-08 NOTE — Telephone Encounter (Signed)
 Pt c/o medication issue:  1. Name of Medication: Semaglutide -Weight Management (WEGOVY ) 0.5 MG/0.5ML SOAJ   2. How are you currently taking this medication (dosage and times per day)? Yes  3. Are you having a reaction (difficulty breathing--STAT)? No   4. What is your medication issue? Pt was told last week her medication would be ready at the pharmacy this week but received a MyChart message this morning that the medication was denied

## 2023-10-08 NOTE — Telephone Encounter (Signed)
 Spoke with patient and she states she received an Clinical cytogeneticist message that her wegovy  has been denied. She states she is a little confused being that she was told she will be able to pick it up today.

## 2023-10-08 NOTE — Telephone Encounter (Signed)
 There is documentation showing a refill request was denied for Wegovy . We have the .5mg  RX ready for pickup at [facility]. Our team wouldn't know why the refill request was denied, please refer to clinic staff.

## 2023-10-08 NOTE — Telephone Encounter (Signed)
 Question answered in my chart message.

## 2023-11-10 ENCOUNTER — Other Ambulatory Visit: Payer: Self-pay | Admitting: Cardiology

## 2023-11-10 ENCOUNTER — Encounter (INDEPENDENT_AMBULATORY_CARE_PROVIDER_SITE_OTHER): Payer: Self-pay | Admitting: Primary Care

## 2023-11-10 DIAGNOSIS — Z6841 Body Mass Index (BMI) 40.0 and over, adult: Secondary | ICD-10-CM

## 2023-11-10 DIAGNOSIS — I739 Peripheral vascular disease, unspecified: Secondary | ICD-10-CM

## 2023-11-11 ENCOUNTER — Encounter (HOSPITAL_COMMUNITY): Payer: Self-pay

## 2023-11-11 ENCOUNTER — Other Ambulatory Visit (HOSPITAL_COMMUNITY): Payer: Self-pay

## 2023-11-11 MED ORDER — WEGOVY 1.7 MG/0.75ML ~~LOC~~ SOAJ
1.7000 mg | SUBCUTANEOUS | 0 refills | Status: DC
  Filled 2023-11-11 – 2023-11-28 (×2): qty 3, 28d supply, fill #0

## 2023-11-11 MED ORDER — WEGOVY 1 MG/0.5ML ~~LOC~~ SOAJ
1.0000 mg | SUBCUTANEOUS | 0 refills | Status: DC
  Filled 2023-11-11: qty 2, 28d supply, fill #0

## 2023-11-11 NOTE — Addendum Note (Signed)
 Addended by: Regie Bunner K on: 11/11/2023 04:37 PM   Modules accepted: Orders

## 2023-11-21 ENCOUNTER — Other Ambulatory Visit (HOSPITAL_COMMUNITY): Payer: Self-pay

## 2023-11-24 ENCOUNTER — Telehealth (INDEPENDENT_AMBULATORY_CARE_PROVIDER_SITE_OTHER): Payer: Self-pay | Admitting: Primary Care

## 2023-11-24 NOTE — Telephone Encounter (Signed)
 Spoke to pt about upcoming appt.. Will be present

## 2023-11-25 ENCOUNTER — Encounter (INDEPENDENT_AMBULATORY_CARE_PROVIDER_SITE_OTHER): Payer: Self-pay

## 2023-11-25 ENCOUNTER — Ambulatory Visit (INDEPENDENT_AMBULATORY_CARE_PROVIDER_SITE_OTHER): Admitting: Primary Care

## 2023-11-28 ENCOUNTER — Ambulatory Visit (HOSPITAL_COMMUNITY)
Admission: RE | Admit: 2023-11-28 | Discharge: 2023-11-28 | Disposition: A | Discharge status: Home / Self Care | Source: Ambulatory Visit | Attending: Cardiology | Admitting: Cardiology

## 2023-11-28 ENCOUNTER — Telehealth (INDEPENDENT_AMBULATORY_CARE_PROVIDER_SITE_OTHER): Payer: Self-pay | Admitting: Primary Care

## 2023-11-28 ENCOUNTER — Other Ambulatory Visit (HOSPITAL_COMMUNITY): Payer: Self-pay

## 2023-11-28 DIAGNOSIS — I739 Peripheral vascular disease, unspecified: Secondary | ICD-10-CM | POA: Diagnosis present

## 2023-11-28 LAB — VAS US ABI WITH/WO TBI
Left ABI: 1.04
Right ABI: 0.77

## 2023-11-28 NOTE — Telephone Encounter (Signed)
 Called pt to confirm appt. Pt will be present.

## 2023-11-30 ENCOUNTER — Ambulatory Visit: Payer: Self-pay | Admitting: Cardiology

## 2023-11-30 NOTE — Progress Notes (Signed)
Will discuss during office visit.

## 2023-12-01 ENCOUNTER — Ambulatory Visit (INDEPENDENT_AMBULATORY_CARE_PROVIDER_SITE_OTHER): Admitting: Primary Care

## 2023-12-01 ENCOUNTER — Encounter (INDEPENDENT_AMBULATORY_CARE_PROVIDER_SITE_OTHER): Payer: Self-pay | Admitting: Primary Care

## 2023-12-01 VITALS — BP 117/80 | HR 64 | Resp 16 | Ht 68.0 in | Wt 287.0 lb

## 2023-12-01 DIAGNOSIS — Z760 Encounter for issue of repeat prescription: Secondary | ICD-10-CM | POA: Diagnosis not present

## 2023-12-01 DIAGNOSIS — E782 Mixed hyperlipidemia: Secondary | ICD-10-CM

## 2023-12-01 DIAGNOSIS — I10 Essential (primary) hypertension: Secondary | ICD-10-CM

## 2023-12-02 LAB — CMP14+EGFR
ALT: 9 IU/L (ref 0–32)
AST: 14 IU/L (ref 0–40)
Albumin: 4 g/dL (ref 3.8–4.9)
Alkaline Phosphatase: 80 IU/L (ref 44–121)
BUN/Creatinine Ratio: 13 (ref 9–23)
BUN: 9 mg/dL (ref 6–24)
Bilirubin Total: 0.3 mg/dL (ref 0.0–1.2)
CO2: 23 mmol/L (ref 20–29)
Calcium: 9 mg/dL (ref 8.7–10.2)
Chloride: 101 mmol/L (ref 96–106)
Creatinine, Ser: 0.67 mg/dL (ref 0.57–1.00)
Globulin, Total: 2.3 g/dL (ref 1.5–4.5)
Glucose: 72 mg/dL (ref 70–99)
Potassium: 3.9 mmol/L (ref 3.5–5.2)
Sodium: 141 mmol/L (ref 134–144)
Total Protein: 6.3 g/dL (ref 6.0–8.5)
eGFR: 101 mL/min/{1.73_m2} (ref >59)

## 2023-12-02 LAB — CBC WITH DIFFERENTIAL/PLATELET
Basophils Absolute: 0.1 10*3/uL (ref 0.0–0.2)
Basos: 1 %
EOS (ABSOLUTE): 0.3 10*3/uL (ref 0.0–0.4)
Eos: 3 %
Hematocrit: 40.5 % (ref 34.0–46.6)
Hemoglobin: 13.1 g/dL (ref 11.1–15.9)
Immature Grans (Abs): 0 10*3/uL (ref 0.0–0.1)
Immature Granulocytes: 0 %
Lymphocytes Absolute: 2 10*3/uL (ref 0.7–3.1)
Lymphs: 21 %
MCH: 29.4 pg (ref 26.6–33.0)
MCHC: 32.3 g/dL (ref 31.5–35.7)
MCV: 91 fL (ref 79–97)
Monocytes Absolute: 0.9 10*3/uL (ref 0.1–0.9)
Monocytes: 9 %
Neutrophils Absolute: 6.1 10*3/uL (ref 1.4–7.0)
Neutrophils: 66 %
Platelets: 210 10*3/uL (ref 150–450)
RBC: 4.45 x10E6/uL (ref 3.77–5.28)
RDW: 12.5 % (ref 11.7–15.4)
WBC: 9.3 10*3/uL (ref 3.4–10.8)

## 2023-12-02 LAB — LIPID PANEL
Chol/HDL Ratio: 3.1 ratio (ref 0.0–4.4)
Cholesterol, Total: 127 mg/dL (ref 100–199)
HDL: 41 mg/dL (ref >39)
LDL Chol Calc (NIH): 71 mg/dL (ref 0–99)
Triglycerides: 77 mg/dL (ref 0–149)
VLDL Cholesterol Cal: 15 mg/dL (ref 5–40)

## 2023-12-04 ENCOUNTER — Other Ambulatory Visit: Payer: Self-pay

## 2023-12-04 ENCOUNTER — Ambulatory Visit (INDEPENDENT_AMBULATORY_CARE_PROVIDER_SITE_OTHER): Payer: Self-pay | Admitting: Primary Care

## 2023-12-04 ENCOUNTER — Ambulatory Visit: Attending: Cardiology | Admitting: Cardiology

## 2023-12-04 ENCOUNTER — Encounter: Payer: Self-pay | Admitting: Cardiology

## 2023-12-04 VITALS — BP 102/69 | HR 82 | Resp 16 | Ht 68.0 in | Wt 282.6 lb

## 2023-12-04 DIAGNOSIS — E7800 Pure hypercholesterolemia, unspecified: Secondary | ICD-10-CM | POA: Insufficient documentation

## 2023-12-04 DIAGNOSIS — I10 Essential (primary) hypertension: Secondary | ICD-10-CM | POA: Insufficient documentation

## 2023-12-04 DIAGNOSIS — I739 Peripheral vascular disease, unspecified: Secondary | ICD-10-CM

## 2023-12-04 DIAGNOSIS — Z6841 Body Mass Index (BMI) 40.0 and over, adult: Secondary | ICD-10-CM | POA: Insufficient documentation

## 2023-12-04 DIAGNOSIS — E66813 Obesity, class 3: Secondary | ICD-10-CM | POA: Diagnosis present

## 2023-12-04 DIAGNOSIS — F17200 Nicotine dependence, unspecified, uncomplicated: Secondary | ICD-10-CM | POA: Insufficient documentation

## 2023-12-04 MED ORDER — CILOSTAZOL 50 MG PO TABS: 50.0000 mg | ORAL_TABLET | Freq: Two times a day (BID) | ORAL | 3 refills | Status: AC

## 2023-12-04 NOTE — Patient Instructions (Signed)
 Medication Instructions:  Your physician has recommended you make the following change in your medication: Decrease amlodipine  to 5 mg by mouth daily   *If you need a refill on your cardiac medications before your next appointment, please call your pharmacy*  Lab Work: none If you have labs (blood work) drawn today and your tests are completely normal, you will receive your results only by: MyChart Message (if you have MyChart) OR A paper copy in the mail If you have any lab test that is abnormal or we need to change your treatment, we will call you to review the results.  Testing/Procedures: none  Follow-Up: At [facility], you and your health needs are our priority.  As part of our continuing mission to provide you with exceptional heart care, our providers are all part of one team.  This team includes your primary Cardiologist (physician) and Advanced Practice Providers or APPs (Physician Assistants and Nurse Practitioners) who all work together to provide you with the care you need, when you need it.  Your next appointment:   6 month(s)  Provider:   Gordy Bergamo, MD    We recommend signing up for the patient portal called MyChart.  Sign up information is provided on this After Visit Summary.  MyChart is used to connect with patients for Virtual Visits (Telemedicine).  Patients are able to view lab/test results, encounter notes, upcoming appointments, etc.  Non-urgent messages can be sent to your provider as well.   To learn more about what you can do with MyChart, go to [email or web address].   Other Instructions

## 2023-12-04 NOTE — Progress Notes (Signed)
 Cardiology Office Note:  .   Date:  12/04/2023  ID:  Sharon Dyer, DOB [birth year], MRN [id number] PCP: Celestia Rosaline SQUIBB, NP  [facility] Providers Cardiologist:  Gordy Bergamo, MD   History of Present Illness: .   Sharon Dyer is a 58 y.o. African-American female patient with hypertension, hypercholesterolemia, ongoing tobacco use disorder, morbid obesity, severe hypovitaminosis D and severe peripheral arterial disease with bilateral SFA angioplasty in 2022, small AAA measuring about 3.4 cm by CT in 2024 presents here for 24-month follow-up of claudication.   Discussed the use of AI scribe software for clinical note transcription with the patient, who gave verbal consent to proceed.  History of Present Illness Sharon Dyer is a 58 year old female with peripheral arterial disease and obesity who presents for follow-up on weight loss and leg pain management. She has lost 25 pounds and quit smoking for almost a month, attributing her progress to medication and lifestyle changes. Wegovy  has reduced her appetite, decreasing her BMI from 48-49 to 42.  Her peripheral arterial disease has shown mild improvement in leg pain, especially in the right leg. She can walk more than before, describing her progress as 'little baby steps'. She is on aspirin  81 mg and cilostazol  for circulation.  Her LDL cholesterol is 71, and she takes atorvastatin  20 mg daily. She is on amlodipine  10 mg for blood pressure, which is well-controlled, likely due to weight loss and smoking cessation which she quit smoking 3 weeks ago.  Labs   Lab Results  Component Value Date   CHOL 127 12/01/2023   HDL 41 12/01/2023   LDLCALC 71 12/01/2023   TRIG 77 12/01/2023   CHOLHDL 3.1 12/01/2023   Lab Results  Component Value Date   NA 141 12/01/2023   K 3.9 12/01/2023   CO2 23 12/01/2023   GLUCOSE 72 12/01/2023   BUN 9 12/01/2023   CREATININE 0.67 12/01/2023   CALCIUM  9.0 12/01/2023   EGFR 101 12/01/2023    GFRNONAA >60 05/29/2023      Latest Ref Rng & Units 12/01/2023   10:26 AM 05/29/2023   11:04 AM 02/26/2023   11:03 AM  BMP  Glucose 70 - 99 mg/dL 72  96  83   BUN 6 - 24 mg/dL 9  18  11    Creatinine 0.57 - 1.00 mg/dL 9.32  9.30  9.17   BUN/Creat Ratio 9 - 23 13   13    Sodium 134 - 144 mmol/L 141  139  144   Potassium 3.5 - 5.2 mmol/L 3.9  3.3  4.1   Chloride 96 - 106 mmol/L 101  103  104   CO2 20 - 29 mmol/L 23  25  24    Calcium  8.7 - 10.2 mg/dL 9.0  9.0  9.2       Latest Ref Rng & Units 12/01/2023   10:26 AM 05/29/2023   11:04 AM 02/26/2023   11:03 AM  CBC  WBC 3.4 - 10.8 x10E3/uL 9.3  11.4  10.2   Hemoglobin 11.1 - 15.9 g/dL 86.8  85.5  86.5   Hematocrit 34.0 - 46.6 % 40.5  42.3  41.0   Platelets 150 - 450 x10E3/uL 210  190  204    Lab Results  Component Value Date   HGBA1C 5.3 09/25/2022    Lab Results  Component Value Date   TSH 1.240 02/05/2019    ROS  Review of Systems  Cardiovascular:  Positive for claudication (improving) and  dyspnea on exertion (stable). Negative for chest pain and leg swelling.   Physical Exam:   VS:  LMP 01/12/2015    Wt Readings from Last 3 Encounters:  12/01/23 287 lb (130.2 kg)  09/10/23 (!) 308 lb 8 oz (139.9 kg)  09/03/23 (!) 307 lb 6.4 oz (139.4 kg)    Physical Exam Constitutional:      Comments: Morbidly obese in no acute distress.  Neck:     Vascular: No carotid bruit or JVD.   Cardiovascular:     Rate and Rhythm: Normal rate and regular rhythm.     Pulses:          Dorsalis pedis pulses are 0 on the right side and 0 on the left side.       Posterior tibial pulses are 0 on the right side and 0 on the left side.     Heart sounds: Normal heart sounds. No murmur heard.    No gallop.  Pulmonary:     Effort: Pulmonary effort is normal.     Breath sounds: Normal breath sounds.  Abdominal:     General: Bowel sounds are normal.     Palpations: Abdomen is soft.     Comments: Obese. Pannus present   Musculoskeletal:     Right  lower leg: No edema.     Left lower leg: No edema.    Studies Reviewed: SABRA    ABI 11/28/2023:  Right ABI 0.77 with multiphasic waveforms.  Left ABI 1.04 with monophasic waveform.    Right ABIs appear decreased. Left ABIs appear increased.    EKG:         Medications ordered    No orders of the defined types were placed in this encounter.    ASSESSMENT AND PLAN: .      ICD-10-CM   1. Claudication in peripheral vascular disease (HCC)  I73.9     2. Hypercholesteremia  E78.00     3. Tobacco use disorder  F17.200     4. Class 3 severe obesity due to excess calories with serious comorbidity and body mass index (BMI) of 45.0 to 49.9 in adult  E66.813    Z68.42       Assessment and Plan Assessment & Plan Peripheral Arterial Disease Peripheral arterial disease with mild reduction in circulation in the right leg. Symptoms have improved with weight loss and smoking cessation. Current management includes medical therapy and lifestyle modifications. She prefers to wait on further interventions unless symptoms worsen. - Continue aspirin  81 mg daily - Continue cilostazol  (Pletal ) 1 tablet per day - Encourage continued weight loss and smoking cessation  Obesity Obesity with significant weight loss of 25 pounds, reducing BMI from 48-49 to 42. Weight loss attributed to lifestyle changes and Wegovy . She is motivated and experiencing positive outcomes, including improved mobility. - Continue Wegovy  for weight management - Encourage continued healthy eating and physical activity  Hypertension Hypertension well-controlled with current medication regimen. Blood pressure is 102, likely improved due to weight loss and smoking cessation. - Reduce amlodipine  to 5 mg daily - Monitor blood pressure regularly  Hyperlipidemia Hyperlipidemia with LDL at 71, slightly higher than target. Anticipated improvement with continued weight loss. - Continue atorvastatin  20 mg daily - Re-evaluate  cholesterol levels in 6 months   Signed,  Gordy Bergamo, MD, [facility] 12/04/2023, 6:43 AM [facility] [address] [place], [place] [address] Phone: [phone]. Fax:  [phone]

## 2023-12-15 MED ORDER — GABAPENTIN 300 MG PO CAPS
300.0000 mg | ORAL_CAPSULE | Freq: Three times a day (TID) | ORAL | 0 refills | Status: AC
Start: 2023-12-15 — End: 2024-12-14

## 2023-12-15 MED ORDER — LOSARTAN POTASSIUM-HCTZ 100-25 MG PO TABS: 1.0000 | ORAL_TABLET | Freq: Every day | ORAL | 1 refills | Status: AC

## 2023-12-15 MED ORDER — AMLODIPINE BESYLATE 10 MG PO TABS: 10.0000 mg | ORAL_TABLET | Freq: Every day | ORAL | 1 refills | Status: AC

## 2023-12-15 NOTE — Progress Notes (Signed)
 [facility]  Sharon Dyer, is a 58 y.o. female  RDW:[id number]  FMW:[id number]  DOB - 1965-07-15  Chief Complaint  Patient presents with   Hypertension   Hyperlipidemia       Subjective:   Sharon Dyer is a 58 y.o. female here today for a follow up visit. Patient has No headache, No chest pain, No abdominal pain - No Nausea, No new weakness tingling or numbness, No Cough - shortness of breath.   No problems updated.  Comprehensive ROS Pertinent positive and negative noted in HPI   No Known Allergies  Past Medical History:  Diagnosis Date   Anxiety    Arthritis    Patient verbalizes she has athritis to right ankle   Dyspnea    Frozen shoulder syndrome 07/30/2019   Hyperlipidemia    Hypertension    Obesity    PAD (peripheral artery disease) (HCC)    Pre-diabetes     Current Outpatient Medications on File Prior to Visit  Medication Sig Dispense Refill   amLODipine  (NORVASC ) 10 MG tablet Take 1 tablet (10 mg total) by mouth daily. 90 tablet 0   aspirin  (ASPIRIN  CHILDRENS) 81 MG chewable tablet Chew 1 tablet (81 mg total) by mouth daily. 90 tablet 1   atorvastatin  (LIPITOR) 20 MG tablet Take 1 tablet (20 mg total) by mouth daily. 90 tablet 3   gabapentin  (NEURONTIN ) 300 MG capsule Take 1 capsule (300 mg total) by mouth 3 (three) times daily. 30 capsule 0   losartan -hydrochlorothiazide  (HYZAAR) 100-25 MG tablet Take 1 tablet by mouth daily. 90 tablet 0   Semaglutide -Weight Management (WEGOVY ) 1.7 MG/0.75ML SOAJ Inject 1.7 mg into the skin once a week. 3 mL 0   triamcinolone  cream (KENALOG ) 0.1 % Apply 1 Application topically 2 (two) times daily. 80 g 1   No current facility-administered medications on file prior to visit.   Health Maintenance  Topic Date Due   Hepatitis B Vaccine (1 of 3 - 19+ 3-dose series) Never done   COVID-19 Vaccine (2 - Pfizer risk series) 09/08/2019   Flu Shot  01/09/2024   Mammogram  11/20/2024   Cologuard (Stool DNA test)   02/23/2025   Pap with HPV screening  02/26/2028   DTaP/Tdap/Td vaccine (2 - Td or Tdap) 03/25/2033   Hepatitis C Screening  Completed   HIV Screening  Completed   Zoster (Shingles) Vaccine  Completed   Pneumococcal Vaccination  Aged Out   HPV Vaccine  Aged Out   Meningitis B Vaccine  Aged Out   Screening for Lung Cancer  Discontinued    Objective:   Vitals:   12/01/23 1040  BP: 117/80  Pulse: 64  Resp: 16  SpO2: 97%  Weight: 287 lb (130.2 kg)  Height: 5' 8 (1.727 m)   BP Readings from Last 3 Encounters:  12/04/23 102/69  12/01/23 117/80  09/03/23 120/68      Physical Exam Vitals reviewed.  Constitutional:      Appearance: Normal appearance. She is obese.  HENT:     Head: Normocephalic.     Right Ear: Tympanic membrane, ear canal and external ear normal.     Left Ear: Tympanic membrane, ear canal and external ear normal.     Nose: Nose normal.     Mouth/Throat:     Mouth: Mucous membranes are moist.  Eyes:     Extraocular Movements: Extraocular movements intact.     Pupils: Pupils are equal, round, and reactive to light.  Cardiovascular:  Rate and Rhythm: Normal rate.  Pulmonary:     Effort: Pulmonary effort is normal.     Breath sounds: Normal breath sounds.  Abdominal:     General: Bowel sounds are normal.     Palpations: Abdomen is soft.  Musculoskeletal:        General: Normal range of motion.     Cervical back: Normal range of motion.  Skin:    General: Skin is warm and dry.  Neurological:     Mental Status: She is alert and oriented to person, place, and time.  Psychiatric:        Mood and Affect: Mood normal.        Behavior: Behavior normal.        Thought Content: Thought content normal.      Assessment & Plan  Sharon Dyer was seen today for hypertension and hyperlipidemia.  Diagnoses and all orders for this visit:  Essential hypertension Well controlled followed by cardiology -     CBC with Differential/Platelet -     CMP14+EGFR -      losartan -hydrochlorothiazide  (HYZAAR) 100-25 MG tablet; Take 1 tablet by mouth daily.  Mixed hyperlipidemia -     Lipid panel  Medication refill 2/2 Other orders -     gabapentin  (NEURONTIN ) 300 MG capsule; Take 1 capsule (300 mg total) by mouth 3 (three) times daily. -     amLODipine  (NORVASC ) 10 MG tablet; Take 1 tablet (10 mg total) by mouth daily. -     losartan -hydrochlorothiazide  (HYZAAR) 100-25 MG tablet; Take 1 tablet by mouth daily.    Patient have been counseled extensively about nutrition and exercise. Other issues discussed during this visit include: low cholesterol diet, weight control and daily exercise, foot care, annual eye examinations at Ophthalmology, importance of adherence with medications and regular follow-up. We also discussed long term complications of uncontrolled diabetes and hypertension.   Return in about 6 months (around 06/01/2024).  The patient was given clear instructions to go to ER or return to medical center if symptoms don't improve, worsen or new problems develop. The patient verbalized understanding. The patient was told to call to get lab results if they haven't heard anything in the next week.   This note has been created with Education officer, environmental. Any transcriptional errors are unintentional.   Sharon SHAUNNA Bohr, NP 12/15/2023, 1:56 PM

## 2024-01-23 LAB — GLUCOSE, POCT (MANUAL RESULT ENTRY): Glucose Fasting, POC: 92 mg/dL (ref 70–99)

## 2024-01-26 ENCOUNTER — Other Ambulatory Visit: Payer: Self-pay | Admitting: Primary Care

## 2024-01-26 DIAGNOSIS — Z1231 Encounter for screening mammogram for malignant neoplasm of breast: Secondary | ICD-10-CM

## 2024-02-08 ENCOUNTER — Other Ambulatory Visit: Payer: Self-pay | Admitting: Cardiology

## 2024-02-08 DIAGNOSIS — E66813 Obesity, class 3: Secondary | ICD-10-CM

## 2024-02-10 ENCOUNTER — Other Ambulatory Visit (HOSPITAL_COMMUNITY): Payer: Self-pay

## 2024-02-10 ENCOUNTER — Encounter (HOSPITAL_COMMUNITY): Payer: Self-pay

## 2024-02-10 MED ORDER — WEGOVY 2.4 MG/0.75ML ~~LOC~~ SOAJ
2.4000 mg | SUBCUTANEOUS | 5 refills | Status: AC
Start: 2024-02-10 — End: ?
  Filled 2024-02-10: qty 3, 28d supply, fill #0
  Filled 2024-03-16: qty 3, 28d supply, fill #1

## 2024-03-16 ENCOUNTER — Other Ambulatory Visit (HOSPITAL_COMMUNITY): Payer: Self-pay

## 2024-03-16 ENCOUNTER — Telehealth: Payer: Self-pay | Admitting: Pharmacy Technician

## 2024-03-16 NOTE — Progress Notes (Addendum)
 The patient attended a screening event on 01/23/2024 where her BP screening results was 133/83, fasting blood glucose was 92. At the event the patient noted she has [employer] and does not smoke. Patient indicated having food SDOH insecurities. Pt list pcp as Rosaline SHAUNNA Bohr.   Per chart review pt has a pcp and the last office visit was 12/01/2023 for hypertension. The pt BP was 117/80 on 12/01/2023. According to chart pt was counseled extensively about nutrition and exercise such as low cholesterol diet, weight control and daily exercise by her pcp on 12/01/2023. Chart review also indicates pt is currently on amlodipine  and losartan -hydrochlorothiazide  to manage hypertension. Chart review also indicates pt is being seen by cardiologist with the recent appt being on 12/04/2023 and pt has a future appt with radiology on 03/19/2024. Post event initial f/u [facility] called pt on 03/18/2024 to f/u on food SDOH needs. Patient stated she would like food resources sent to her due to her SNAP benefit being reduced. Patient stated she would like food resources sent to her via mailing address.   Letter sent with [employer] card with food resources and flyers with food pantry list. In case needed by pt. Additional pt f/u to be scheduled per health equity protocol.

## 2024-03-16 NOTE — Telephone Encounter (Signed)
 Pharmacy Patient Advocate Encounter   Received notification from [facility] pharmacy that prior authorization for wegovy  2.4mg  is required/requested.   Insurance verification completed.   The patient is insured through [facility] MEDICAID.   Per test claim: PA required; PA submitted to above mentioned insurance via Latent Key/confirmation #/EOC [id number] Status is pending

## 2024-03-17 NOTE — Telephone Encounter (Addendum)
    Sent note to patient for her to sign the denial so we can appeal and use vaishali's notes from 09/2023:   1. Weight loss - Patient has not met goal of at least 5% of body weight loss with comprehensive lifestyle modifications alone in the past 3-6 months. Pharmacotherapy is appropriate to pursue as augmentation. Will start coverage assessment for Wegovy . Confirmed patient has no personal or family history of medullary thyroid  carcinoma (MTC) or Multiple Endocrine Neoplasia syndrome type 2 (MEN 2). Injection technique reviewed at today's visit.

## 2024-03-18 ENCOUNTER — Ambulatory Visit
Admission: RE | Admit: 2024-03-18 | Discharge: 2024-03-18 | Disposition: A | Discharge status: Home / Self Care | Source: Ambulatory Visit | Attending: Primary Care | Admitting: Primary Care

## 2024-03-18 DIAGNOSIS — Z1231 Encounter for screening mammogram for malignant neoplasm of breast: Secondary | ICD-10-CM

## 2024-03-19 ENCOUNTER — Encounter

## 2024-03-19 NOTE — Telephone Encounter (Signed)
 Spoke to patient and emailed denial for her to sign

## 2024-03-23 ENCOUNTER — Other Ambulatory Visit: Payer: Self-pay | Admitting: Primary Care

## 2024-03-23 DIAGNOSIS — R928 Other abnormal and inconclusive findings on diagnostic imaging of breast: Secondary | ICD-10-CM

## 2024-03-26 NOTE — Telephone Encounter (Signed)
 I tried to call the patient to follow up but she did not answer and her voicemail is full

## 2024-03-30 ENCOUNTER — Ambulatory Visit
Admission: RE | Admit: 2024-03-30 | Discharge: 2024-03-30 | Disposition: A | Discharge status: Home / Self Care | Source: Ambulatory Visit | Attending: Primary Care | Admitting: Primary Care

## 2024-03-30 DIAGNOSIS — R928 Other abnormal and inconclusive findings on diagnostic imaging of breast: Secondary | ICD-10-CM

## 2024-04-12 ENCOUNTER — Encounter: Payer: Self-pay | Admitting: Radiology

## 2024-05-21 NOTE — Progress Notes (Addendum)
 The patient attended a screening event on 01/23/2024 where her screening results were a BP of 133/83 and a blood glucose of 92. At the event the patient noted she has Medicaid, does not smoke, and listed Rosaline Bohr as her PCP. Patient indicated a food SDOH need at the time of the event.   Per chart review the patient has Rosaline Bohr as her PCP, has Medicaid for insurance, and has a food SDOH need. Chart review indicates the patients last appt with her PCP was on 12/01/2023.  [facility] was unable to reach patient for follow up, unable to leave a vm due to mailbox being full. Letter along with food resources mailed to patient. An additional follow up will be done in according to the health equity team's protocol.
